# Patient Record
Sex: Male | Born: 1940
Health system: Southern US, Community
[De-identification: ages and names within clinical notes are randomized; demographics above are authoritative.]

## PROBLEM LIST (undated history)

## (undated) DIAGNOSIS — Z95810 Presence of automatic (implantable) cardiac defibrillator: Secondary | ICD-10-CM

## (undated) DIAGNOSIS — G43909 Migraine, unspecified, not intractable, without status migrainosus: Secondary | ICD-10-CM

## (undated) DIAGNOSIS — I255 Ischemic cardiomyopathy: Secondary | ICD-10-CM

## (undated) DIAGNOSIS — E7800 Pure hypercholesterolemia, unspecified: Secondary | ICD-10-CM

## (undated) DIAGNOSIS — G8929 Other chronic pain: Secondary | ICD-10-CM

## (undated) DIAGNOSIS — I10 Essential (primary) hypertension: Secondary | ICD-10-CM

## (undated) DIAGNOSIS — M62838 Other muscle spasm: Secondary | ICD-10-CM

## (undated) DIAGNOSIS — M542 Cervicalgia: Secondary | ICD-10-CM

## (undated) DIAGNOSIS — R51 Headache: Secondary | ICD-10-CM

## (undated) DIAGNOSIS — I2510 Atherosclerotic heart disease of native coronary artery without angina pectoris: Secondary | ICD-10-CM

## (undated) DIAGNOSIS — I219 Acute myocardial infarction, unspecified: Secondary | ICD-10-CM

## (undated) HISTORY — PX: TIBIA FRACTURE SURGERY: SHX806

## (undated) HISTORY — PX: CHOLECYSTECTOMY: SHX55

## (undated) HISTORY — DX: Essential (primary) hypertension: I10

## (undated) HISTORY — DX: Presence of automatic (implantable) cardiac defibrillator: Z95.810

## (undated) HISTORY — DX: Ischemic cardiomyopathy: I25.5

## (undated) HISTORY — PX: OTHER SURGICAL HISTORY: SHX169

---

## 2002-08-15 ENCOUNTER — Encounter: Payer: Self-pay | Admitting: Internal Medicine

## 2002-08-15 ENCOUNTER — Ambulatory Visit (HOSPITAL_COMMUNITY): Admission: RE | Admit: 2002-08-15 | Discharge: 2002-08-15 | Payer: Self-pay | Admitting: *Deleted

## 2005-01-31 ENCOUNTER — Ambulatory Visit (HOSPITAL_COMMUNITY): Admission: RE | Admit: 2005-01-31 | Discharge: 2005-01-31 | Payer: Self-pay | Admitting: Internal Medicine

## 2005-02-06 ENCOUNTER — Encounter (HOSPITAL_COMMUNITY): Admission: RE | Admit: 2005-02-06 | Discharge: 2005-02-07 | Payer: Self-pay | Admitting: Internal Medicine

## 2005-02-10 ENCOUNTER — Ambulatory Visit (HOSPITAL_COMMUNITY): Admission: RE | Admit: 2005-02-10 | Discharge: 2005-02-10 | Payer: Self-pay | Admitting: Internal Medicine

## 2005-05-08 ENCOUNTER — Encounter (HOSPITAL_COMMUNITY): Admission: RE | Admit: 2005-05-08 | Discharge: 2005-06-07 | Payer: Self-pay | Admitting: Orthopaedic Surgery

## 2005-06-08 ENCOUNTER — Encounter (HOSPITAL_COMMUNITY): Admission: RE | Admit: 2005-06-08 | Discharge: 2005-07-11 | Payer: Self-pay | Admitting: Orthopaedic Surgery

## 2009-09-13 ENCOUNTER — Ambulatory Visit (HOSPITAL_COMMUNITY): Admission: RE | Admit: 2009-09-13 | Discharge: 2009-09-13 | Payer: Self-pay | Admitting: Ophthalmology

## 2010-02-28 ENCOUNTER — Inpatient Hospital Stay (HOSPITAL_COMMUNITY)
Admission: EM | Admit: 2010-02-28 | Discharge: 2010-03-02 | Payer: Self-pay | Source: Home / Self Care | Attending: General Surgery | Admitting: General Surgery

## 2010-03-01 ENCOUNTER — Encounter (INDEPENDENT_AMBULATORY_CARE_PROVIDER_SITE_OTHER): Payer: Self-pay | Admitting: General Surgery

## 2010-03-02 LAB — DIFFERENTIAL
Basophils Absolute: 0 10*3/uL (ref 0.0–0.1)
Basophils Relative: 0 % (ref 0–1)
Eosinophils Absolute: 0.1 10*3/uL (ref 0.0–0.7)
Eosinophils Relative: 1 % (ref 0–5)
Lymphocytes Relative: 7 % — ABNORMAL LOW (ref 12–46)
Lymphs Abs: 0.8 10*3/uL (ref 0.7–4.0)
Monocytes Absolute: 0.5 10*3/uL (ref 0.1–1.0)
Monocytes Relative: 4 % (ref 3–12)
Neutro Abs: 9.8 10*3/uL — ABNORMAL HIGH (ref 1.7–7.7)
Neutrophils Relative %: 88 % — ABNORMAL HIGH (ref 43–77)

## 2010-03-02 LAB — MAGNESIUM: Magnesium: 1.7 mg/dL (ref 1.5–2.5)

## 2010-03-02 LAB — HEPATIC FUNCTION PANEL
ALT: 37 U/L (ref 0–53)
AST: 35 U/L (ref 0–37)
Albumin: 3.1 g/dL — ABNORMAL LOW (ref 3.5–5.2)
Alkaline Phosphatase: 55 U/L (ref 39–117)
Bilirubin, Direct: 0.2 mg/dL (ref 0.0–0.3)
Indirect Bilirubin: 0.6 mg/dL (ref 0.3–0.9)
Total Bilirubin: 0.8 mg/dL (ref 0.3–1.2)
Total Protein: 6.2 g/dL (ref 6.0–8.3)

## 2010-03-02 LAB — CBC
HCT: 37.8 % — ABNORMAL LOW (ref 39.0–52.0)
Hemoglobin: 13.1 g/dL (ref 13.0–17.0)
MCH: 32.3 pg (ref 26.0–34.0)
MCHC: 34.7 g/dL (ref 30.0–36.0)
MCV: 93.1 fL (ref 78.0–100.0)
Platelets: 135 10*3/uL — ABNORMAL LOW (ref 150–400)
RBC: 4.06 MIL/uL — ABNORMAL LOW (ref 4.22–5.81)
RDW: 13.1 % (ref 11.5–15.5)
WBC: 11.1 10*3/uL — ABNORMAL HIGH (ref 4.0–10.5)

## 2010-03-02 LAB — BASIC METABOLIC PANEL
BUN: 15 mg/dL (ref 6–23)
CO2: 29 mEq/L (ref 19–32)
Calcium: 8.9 mg/dL (ref 8.4–10.5)
Chloride: 100 mEq/L (ref 96–112)
Creatinine, Ser: 1.05 mg/dL (ref 0.4–1.5)
GFR calc Af Amer: >60 mL/min (ref >60)
GFR calc non Af Amer: >60 mL/min (ref >60)
Glucose, Bld: 110 mg/dL — ABNORMAL HIGH (ref 70–99)
Potassium: 4.1 mEq/L (ref 3.5–5.1)
Sodium: 135 mEq/L (ref 135–145)

## 2010-03-02 LAB — PHOSPHORUS: Phosphorus: 1.7 mg/dL — ABNORMAL LOW (ref 2.3–4.6)

## 2010-04-18 ENCOUNTER — Ambulatory Visit: Payer: Medicare Other | Attending: Internal Medicine

## 2010-04-18 DIAGNOSIS — G4710 Hypersomnia, unspecified: Secondary | ICD-10-CM | POA: Insufficient documentation

## 2010-04-18 DIAGNOSIS — G4730 Sleep apnea, unspecified: Secondary | ICD-10-CM | POA: Insufficient documentation

## 2010-05-09 LAB — COMPREHENSIVE METABOLIC PANEL
ALT: 41 U/L (ref 0–53)
AST: 35 U/L (ref 0–37)
Albumin: 4.3 g/dL (ref 3.5–5.2)
Alkaline Phosphatase: 63 U/L (ref 39–117)
BUN: 18 mg/dL (ref 6–23)
CO2: 28 mEq/L (ref 19–32)
Calcium: 10 mg/dL (ref 8.4–10.5)
Chloride: 101 mEq/L (ref 96–112)
Creatinine, Ser: 1.2 mg/dL (ref 0.4–1.5)
GFR calc Af Amer: >60 mL/min (ref >60)
GFR calc non Af Amer: >60 mL/min (ref >60)
Glucose, Bld: 158 mg/dL — ABNORMAL HIGH (ref 70–99)
Potassium: 4.8 mEq/L (ref 3.5–5.1)
Sodium: 140 mEq/L (ref 135–145)
Total Bilirubin: 0.6 mg/dL (ref 0.3–1.2)
Total Protein: 6.9 g/dL (ref 6.0–8.3)

## 2010-05-09 LAB — CBC
HCT: 44.1 % (ref 39.0–52.0)
Hemoglobin: 15.6 g/dL (ref 13.0–17.0)
MCH: 32.3 pg (ref 26.0–34.0)
MCHC: 35.4 g/dL (ref 30.0–36.0)
MCV: 91.3 fL (ref 78.0–100.0)
Platelets: 202 10*3/uL (ref 150–400)
RBC: 4.83 MIL/uL (ref 4.22–5.81)
RDW: 12.7 % (ref 11.5–15.5)
WBC: 16.4 10*3/uL — ABNORMAL HIGH (ref 4.0–10.5)

## 2010-05-09 LAB — URINALYSIS, ROUTINE W REFLEX MICROSCOPIC
Bilirubin Urine: NEGATIVE
Glucose, UA: NEGATIVE mg/dL
Ketones, ur: NEGATIVE mg/dL
Leukocytes, UA: NEGATIVE
Nitrite: NEGATIVE
Protein, ur: NEGATIVE mg/dL
Specific Gravity, Urine: 1.015 (ref 1.005–1.030)
Urobilinogen, UA: 0.2 mg/dL (ref 0.0–1.0)
pH: 7 (ref 5.0–8.0)

## 2010-05-09 LAB — LIPASE, BLOOD: Lipase: 17 U/L (ref 11–59)

## 2010-05-09 LAB — DIFFERENTIAL
Basophils Absolute: 0.1 10*3/uL (ref 0.0–0.1)
Basophils Relative: 0 % (ref 0–1)
Eosinophils Absolute: 0 10*3/uL (ref 0.0–0.7)
Eosinophils Relative: 0 % (ref 0–5)
Lymphocytes Relative: 4 % — ABNORMAL LOW (ref 12–46)
Lymphs Abs: 0.7 10*3/uL (ref 0.7–4.0)
Monocytes Absolute: 0.7 10*3/uL (ref 0.1–1.0)
Monocytes Relative: 4 % (ref 3–12)
Neutro Abs: 15 10*3/uL — ABNORMAL HIGH (ref 1.7–7.7)
Neutrophils Relative %: 91 % — ABNORMAL HIGH (ref 43–77)

## 2010-05-09 LAB — POCT CARDIAC MARKERS
CKMB, poc: 2 ng/mL (ref 1.0–8.0)
Myoglobin, poc: 59.7 ng/mL (ref 12–200)
Troponin i, poc: <0.05 ng/mL (ref 0.00–0.09)

## 2010-05-09 LAB — URINE MICROSCOPIC-ADD ON

## 2010-05-09 LAB — GLUCOSE, CAPILLARY: Glucose-Capillary: 130 mg/dL — ABNORMAL HIGH (ref 70–99)

## 2010-05-12 ENCOUNTER — Emergency Department (HOSPITAL_COMMUNITY): Payer: Medicare Other

## 2010-05-12 ENCOUNTER — Other Ambulatory Visit: Payer: Self-pay | Admitting: Orthopaedic Surgery

## 2010-05-12 ENCOUNTER — Observation Stay (HOSPITAL_COMMUNITY)
Admission: EM | Admit: 2010-05-12 | Discharge: 2010-05-13 | Disposition: A | Discharge status: Home / Self Care | Payer: Medicare Other | Attending: Orthopaedic Surgery | Admitting: Orthopaedic Surgery

## 2010-05-12 DIAGNOSIS — Z23 Encounter for immunization: Secondary | ICD-10-CM | POA: Insufficient documentation

## 2010-05-12 DIAGNOSIS — S82899B Other fracture of unspecified lower leg, initial encounter for open fracture type I or II: Principal | ICD-10-CM | POA: Insufficient documentation

## 2010-05-12 DIAGNOSIS — Z79899 Other long term (current) drug therapy: Secondary | ICD-10-CM | POA: Insufficient documentation

## 2010-05-12 DIAGNOSIS — W298XXA Contact with other powered powered hand tools and household machinery, initial encounter: Secondary | ICD-10-CM | POA: Insufficient documentation

## 2010-05-12 LAB — DIFFERENTIAL
Eosinophils Relative: 2 % (ref 0–5)
Lymphocytes Relative: 17 % (ref 12–46)
Lymphs Abs: 1.1 10*3/uL (ref 0.7–4.0)
Neutrophils Relative %: 73 % (ref 43–77)

## 2010-05-12 LAB — BASIC METABOLIC PANEL
Chloride: 107 mEq/L (ref 96–112)
GFR calc non Af Amer: >60 mL/min (ref >60)
Glucose, Bld: 102 mg/dL — ABNORMAL HIGH (ref 70–99)
Potassium: 4.5 mEq/L (ref 3.5–5.1)
Sodium: 139 mEq/L (ref 135–145)

## 2010-05-12 LAB — CBC
HCT: 39.2 % (ref 39.0–52.0)
MCV: 89.7 fL (ref 78.0–100.0)
RBC: 4.37 MIL/uL (ref 4.22–5.81)
WBC: 6.7 10*3/uL (ref 4.0–10.5)

## 2010-05-13 LAB — DIFFERENTIAL
Eosinophils Relative: 2 % (ref 0–5)
Lymphocytes Relative: 21 % (ref 12–46)
Lymphs Abs: 1.8 10*3/uL (ref 0.7–4.0)
Monocytes Absolute: 0.8 10*3/uL (ref 0.1–1.0)

## 2010-05-13 LAB — CBC
HCT: 37.9 % — ABNORMAL LOW (ref 39.0–52.0)
MCH: 31.4 pg (ref 26.0–34.0)
MCHC: 34.3 g/dL (ref 30.0–36.0)
MCV: 91.5 fL (ref 78.0–100.0)
RDW: 13.5 % (ref 11.5–15.5)

## 2010-05-13 LAB — BASIC METABOLIC PANEL
BUN: 15 mg/dL (ref 6–23)
Calcium: 8.6 mg/dL (ref 8.4–10.5)
GFR calc non Af Amer: >60 mL/min (ref >60)
Glucose, Bld: 95 mg/dL (ref 70–99)

## 2010-05-14 ENCOUNTER — Ambulatory Visit (HOSPITAL_COMMUNITY): Payer: Medicare Other

## 2010-05-15 ENCOUNTER — Encounter (HOSPITAL_COMMUNITY): Payer: Medicare Other | Attending: Oncology

## 2010-05-15 ENCOUNTER — Ambulatory Visit (HOSPITAL_COMMUNITY): Payer: Medicare Other

## 2010-05-15 DIAGNOSIS — S82209B Unspecified fracture of shaft of unspecified tibia, initial encounter for open fracture type I or II: Secondary | ICD-10-CM | POA: Insufficient documentation

## 2010-05-15 DIAGNOSIS — W298XXA Contact with other powered powered hand tools and household machinery, initial encounter: Secondary | ICD-10-CM | POA: Insufficient documentation

## 2010-05-15 LAB — BASIC METABOLIC PANEL
GFR calc non Af Amer: >60 mL/min (ref >60)
Glucose, Bld: 98 mg/dL (ref 70–99)
Potassium: 4.4 mEq/L (ref 3.5–5.1)
Sodium: 137 mEq/L (ref 135–145)

## 2010-05-16 LAB — WOUND CULTURE: Culture: NO GROWTH

## 2010-05-17 NOTE — Op Note (Signed)
NAME:  Thomas Black, Thomas Black                 ACCOUNT NO.:  [id number]  MEDICAL RECORD NO.:  [id number]           PATIENT TYPE:  I  LOCATION:  A319                          FACILITY:  [facility]  PHYSICIAN:  J. Darreld Mclean, M.D. DATE OF BIRTH:  Apr 25, 1940  DATE OF PROCEDURE: DATE OF DISCHARGE:                              OPERATIVE REPORT   PREOPERATIVE DIAGNOSIS:  Open fracture left tibia from chainsaw injury.  POSTOPERATIVE DIAGNOSIS:  Open fracture left tibia from chainsaw injury.  PROCEDURE:  Irrigation and debridement of the left tibia wound using 9 L of pulse lavage system and primary closure of wound.  ANESTHESIA:  Spinal.  SURGEON:  J. Darreld Mclean, MD  DRAINS:  None.  TOURNIQUET TIME:  28 minutes.  INDICATIONS:  The patient is a 70 year old male who was using a chainsaw and cut his right mid distal third tibia, he has got more of a chip type fracture out of it, it did not go through the entire tibia and it did not go through the entire cortex.  There is a long extensive laceration about 8-10 cm long.  It is a dirty wound.  I explained to the patient the need for the procedure, explained possible infection, explained about IV antibiotics, explained that he would be admitted tonight in the hospital and probably to do it tomorrow.  He appeared to understand and agreed to the procedure as outlined.  The patient marked on the left side with a pen.  I marked it with a pen. He was brought to the operating room, given spinal anesthesia.  Once spinal anesthesia was obtained, tourniquet placed and deflated on left upper thigh, was prepped and draped in usual manner.  Generalized time-out, all instrumentation deemed to be properly positioned and working.  The operating room team knew each other.  I elevated the leg and did not use the Esmarch.  Tourniquet inflated to 250 mmHg.  There was some devitalized tissue around the wound, I went ahead and removed that and I sharply debrided  some areas of dirt and other foreign material.  I used 9 L of the water pick solution.  I used Simplex water pick device and __________ cleaned and debrided the area. The wound looked excellent at the end.  He only had so-called nick in the bone of V-shape cut and I was able to pull the fascia back very nicely and repaired with 2-0 chromic, 2-0 plain and skin staples.  I elected not to use a drain.  Cultures were taken prior to all of this. I will begin IV vancomycin and wait for the cultures after several days and hopefully it can be changed to p.o. medication at that time.  The patient tolerated the procedure well, total tourniquet time was 28 minutes.  Sterile dressing applied.  Bulky dressing applied.  Ace bandage applied at the end of the case.          ______________________________ J. Darreld Mclean, M.D.     JWK/MEDQ  D:  05/12/2010  T:  05/13/2010  Job:  [id number]  Electronically Signed by Darreld Mclean M.D. on 05/17/2010 66:06:30 AM

## 2010-05-17 NOTE — Discharge Summary (Signed)
  NAME:  Thomas Black, Thomas Black                 ACCOUNT NO.:  [id number]  MEDICAL RECORD NO.:  [id number]           PATIENT TYPE:  O  LOCATION:  A319                          FACILITY:  [facility]  PHYSICIAN:  J. Darreld Mclean, M.D. DATE OF BIRTH:  01-09-1941  DATE OF ADMISSION:  05/12/2010 DATE OF DISCHARGE:  03/16/2012LH                              DISCHARGE SUMMARY   DISCHARGE DIAGNOSIS:  Fracture of left tibia, open dirty wound, secondary to chainsaw injury.  DISCHARGE STATUS AND PROGNOSIS:  Good.  DISPOSITION:  Home.  The patient is to come back to the hospital tomorrow, Saturday; and Sunday and Monday for IV vancomycin therapy.  DISCHARGE MEDICATIONS:  Percocet 7.5/325.  The patient is to elevate his leg, keep it dry, use crutches.  He has a Cam walker already.  BRIEF HISTORY:  The patient cut himself with a chainsaw yesterday, sent to the operating room last night, had irrigation and debridement for primary closure of the wound.  Care instructions have been given.  He is to elevate his leg, stay off of it.  He has remained afebrile.  He has done well.  He has gone to physical therapy, he has walked up and down the steps.  He is very stable on crutches.  It has been explained if any difficulties, contact me through the office hospital beeper system.  I will see him in my office on Monday morning.                                           ______________________________ J. Darreld Mclean, M.D.     JWK/MEDQ  D:  05/13/2010  T:  05/13/2010  Job:  [id number]  Electronically Signed by Darreld Mclean M.D. on 05/17/2010 08:27:32 AM

## 2010-06-17 ENCOUNTER — Other Ambulatory Visit: Payer: Self-pay | Admitting: Dermatology

## 2010-07-15 NOTE — Procedures (Signed)
NAMELARK, RUNK NO.:  [id number]   MEDICAL RECORD NO.:  [id number]          PATIENT TYPE:  REC   LOCATION:  RAD                           FACILITY:  [facility]   PHYSICIAN:  Kingsley Callander. [name], MD       DATE OF BIRTH:  17-Dec-1940   DATE OF PROCEDURE:  02/06/2005  DATE OF DISCHARGE:                                    STRESS TEST   HISTORY OF PRESENT ILLNESS:  The patient exercised 12 minutes (completing  stage IV of the Bruce protocol), obtaining a maximal heart rate of 142 (90%  of the age predicted maximal heart rate, and a work load of 12.8 mets).  Discontinued exercise due to fatigue.  There were no symptoms of chest pain.  There were infrequent PVC's.  There were no ST segment changes diagnostic of  ischemia.  The baseline electrocardiogram revealed normal sinus rhythm at 70  beats per minute.   IMPRESSION:  No evidence of exercise induced ischemia.  Myoview images  pending.      Kingsley Callander. [name], MD  Electronically Signed     ROF/MEDQ  D:  02/06/2005  T:  02/06/2005  Job:  [id number]

## 2010-07-21 ENCOUNTER — Other Ambulatory Visit: Payer: Self-pay | Admitting: Dermatology

## 2010-09-26 ENCOUNTER — Telehealth (INDEPENDENT_AMBULATORY_CARE_PROVIDER_SITE_OTHER): Payer: Self-pay | Admitting: *Deleted

## 2010-09-26 ENCOUNTER — Other Ambulatory Visit (INDEPENDENT_AMBULATORY_CARE_PROVIDER_SITE_OTHER): Payer: Self-pay | Admitting: *Deleted

## 2010-09-26 DIAGNOSIS — Z1211 Encounter for screening for malignant neoplasm of colon: Secondary | ICD-10-CM

## 2010-09-26 NOTE — Telephone Encounter (Signed)
Pt need movi prep escribed  TCS sch'd 01/11/11 @ 9:30 (8:30), ASA 2 days, movi prep inst mailed

## 2010-09-27 MED ORDER — PEG-KCL-NACL-NASULF-NA ASC-C 100 G PO SOLR: 1.0000 | Freq: Once | ORAL | Status: DC

## 2010-12-16 ENCOUNTER — Other Ambulatory Visit (INDEPENDENT_AMBULATORY_CARE_PROVIDER_SITE_OTHER): Payer: Self-pay | Admitting: *Deleted

## 2010-12-16 DIAGNOSIS — Z1211 Encounter for screening for malignant neoplasm of colon: Secondary | ICD-10-CM

## 2010-12-30 ENCOUNTER — Encounter (HOSPITAL_COMMUNITY): Payer: Self-pay | Admitting: Pharmacy Technician

## 2011-02-15 ENCOUNTER — Telehealth (INDEPENDENT_AMBULATORY_CARE_PROVIDER_SITE_OTHER): Payer: Self-pay | Admitting: *Deleted

## 2011-02-15 NOTE — Telephone Encounter (Signed)
PCP/Requesting MD:  [name]  Name: Thomas Black  DOB: 10/26/1940   Procedure: TCS  Reason/Indication:  SCREENING  Has patient had this procedure before?  YES  If so, when, by whom and where?  2002  Is there a family history of colon cancer?  NO  Who?  What age when diagnosed?    Is patient diabetic?   NO      Does patient have prosthetic heart valve?  NO  Do you have a pacemaker?  NO  Has patient had joint replacement within last 12 months?  NO  Is patient on Coumadin, Plavix and/or Aspirin? YES  Medications: ASA 81 MG DAILY, VIT D 3 1000 IU DAILY, SIMVASTATIN 40 MG DAILY, ATENOLOL 25 MG DAILY, STOOL SOFTENER DAILY, CYCLOBENZAPRINE 10 MG BID, OXYCODONE 325 MG PRN FOR MIGRAINES  Allergies: NKDA  Pharmacy: [facility]  Medication Adjustment: ASA 2 DAYS  Other:   Procedure date & time: 03/15/11 @ 730

## 2011-02-17 NOTE — Telephone Encounter (Signed)
approved

## 2011-03-14 MED ORDER — SODIUM CHLORIDE 0.45 % IV SOLN
Freq: Once | INTRAVENOUS | Status: AC
  Administered 2011-03-15: 07:00:00 via INTRAVENOUS

## 2011-03-15 ENCOUNTER — Ambulatory Visit (HOSPITAL_COMMUNITY)
Admission: RE | Admit: 2011-03-15 | Discharge: 2011-03-15 | Disposition: A | Discharge status: Home / Self Care | Payer: Medicare Other | Source: Ambulatory Visit | Attending: Internal Medicine | Admitting: Internal Medicine

## 2011-03-15 ENCOUNTER — Encounter (HOSPITAL_COMMUNITY)
Admission: RE | Disposition: A | Discharge status: Home / Self Care | Payer: Self-pay | Source: Ambulatory Visit | Attending: Internal Medicine

## 2011-03-15 ENCOUNTER — Encounter (HOSPITAL_COMMUNITY): Payer: Self-pay | Admitting: *Deleted

## 2011-03-15 DIAGNOSIS — K5730 Diverticulosis of large intestine without perforation or abscess without bleeding: Secondary | ICD-10-CM | POA: Insufficient documentation

## 2011-03-15 DIAGNOSIS — K644 Residual hemorrhoidal skin tags: Secondary | ICD-10-CM | POA: Insufficient documentation

## 2011-03-15 DIAGNOSIS — Z1211 Encounter for screening for malignant neoplasm of colon: Secondary | ICD-10-CM

## 2011-03-15 DIAGNOSIS — I10 Essential (primary) hypertension: Secondary | ICD-10-CM | POA: Insufficient documentation

## 2011-03-15 DIAGNOSIS — E7800 Pure hypercholesterolemia, unspecified: Secondary | ICD-10-CM | POA: Insufficient documentation

## 2011-03-15 HISTORY — DX: Essential (primary) hypertension: I10

## 2011-03-15 HISTORY — DX: Headache: R51

## 2011-03-15 HISTORY — DX: Other muscle spasm: M62.838

## 2011-03-15 HISTORY — DX: Other chronic pain: G89.29

## 2011-03-15 HISTORY — PX: COLONOSCOPY: SHX5424

## 2011-03-15 HISTORY — DX: Pure hypercholesterolemia, unspecified: E78.00

## 2011-03-15 HISTORY — DX: Cervicalgia: M54.2

## 2011-03-15 SURGERY — COLONOSCOPY
Anesthesia: Moderate Sedation

## 2011-03-15 MED ORDER — STERILE WATER FOR IRRIGATION IR SOLN
Status: DC | PRN
  Administered 2011-03-15: 08:00:00

## 2011-03-15 MED ORDER — MIDAZOLAM HCL 5 MG/5ML IJ SOLN
INTRAMUSCULAR | Status: AC
  Filled 2011-03-15: qty 10

## 2011-03-15 MED ORDER — MEPERIDINE HCL 50 MG/ML IJ SOLN
INTRAMUSCULAR | Status: DC | PRN
  Administered 2011-03-15 (×2): 25 mg via INTRAVENOUS

## 2011-03-15 MED ORDER — MEPERIDINE HCL 50 MG/ML IJ SOLN
INTRAMUSCULAR | Status: AC
  Filled 2011-03-15: qty 1

## 2011-03-15 MED ORDER — MIDAZOLAM HCL 5 MG/5ML IJ SOLN
INTRAMUSCULAR | Status: DC | PRN
  Administered 2011-03-15 (×5): 2 mg via INTRAVENOUS

## 2011-03-15 NOTE — Op Note (Signed)
COLONOSCOPY PROCEDURE REPORT  PATIENT:  Thomas Black  MR#:  [id number] Birthdate:  01-Dec-1940, 71 y.o., male Endoscopist:  Dr. [name], MD Referred By:  Dr. [name], MD Procedure Date: 03/15/2011  Procedure:   Colonoscopy  Indications:  Patient is 71 year old Caucasian male who is undergoing average risk screening colonoscopy. His last exam was 10 years ago.  Informed Consent: Procedure and risks were reviewed with the patient and informed consent was obtained.  Medications:  Demerol 50 mg IV Versed 10 mg IV  Description of procedure:  After a digital rectal exam was performed, that colonoscope was advanced from the anus through the rectum and colon to the area of the cecum, ileocecal valve and appendiceal orifice. The cecum was deeply intubated. These structures were well-seen and photographed for the record. From the level of the cecum and ileocecal valve, the scope was slowly and cautiously withdrawn. The mucosal surfaces were carefully surveyed utilizing scope tip to flexion to facilitate fold flattening as needed. The scope was pulled down into the rectum where a thorough exam including retroflexion was performed. Terminal ileum was also examined.  Findings:   Prep excellent. Normal terminal ileum. Few small diverticula at sigmoid colon; no colonic polyps or other mucosal abnormalities noted. Small hemorrhoids below the dentate line.  Therapeutic/Diagnostic Maneuvers Performed:  None  Complications:  None  Cecal Withdrawal Time:  11 minutes  Impression:  Normal terminal ileum. Few small diverticula at sigmoid colon and external hemorrhoids otherwise normal colonoscopy.  Recommendations:  Standard instructions given. Consider next screening exam in 10 years.  Jeno Calleros U  03/15/2011 8:19 AM  CC: Dr. [name], MD & Dr. Bonnetta Barry ref. provider found
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
## 2011-03-21 ENCOUNTER — Encounter (HOSPITAL_COMMUNITY): Payer: Self-pay | Admitting: Internal Medicine

## 2011-03-29 ENCOUNTER — Other Ambulatory Visit: Payer: Self-pay | Admitting: Dermatology

## 2011-07-06 ENCOUNTER — Other Ambulatory Visit: Payer: Self-pay | Admitting: Dermatology

## 2011-11-10 ENCOUNTER — Encounter (INDEPENDENT_AMBULATORY_CARE_PROVIDER_SITE_OTHER): Payer: Medicare Other | Admitting: Ophthalmology

## 2011-11-10 DIAGNOSIS — Q143 Congenital malformation of choroid: Secondary | ICD-10-CM

## 2011-11-10 DIAGNOSIS — H35039 Hypertensive retinopathy, unspecified eye: Secondary | ICD-10-CM

## 2011-11-10 DIAGNOSIS — H2510 Age-related nuclear cataract, unspecified eye: Secondary | ICD-10-CM

## 2011-11-10 DIAGNOSIS — H43819 Vitreous degeneration, unspecified eye: Secondary | ICD-10-CM

## 2011-11-10 DIAGNOSIS — I10 Essential (primary) hypertension: Secondary | ICD-10-CM

## 2012-01-04 ENCOUNTER — Other Ambulatory Visit: Payer: Self-pay | Admitting: Dermatology

## 2012-04-25 ENCOUNTER — Other Ambulatory Visit: Payer: Self-pay | Admitting: Dermatology

## 2012-06-24 ENCOUNTER — Encounter (HOSPITAL_COMMUNITY): Payer: Self-pay | Admitting: *Deleted

## 2012-06-24 ENCOUNTER — Emergency Department (HOSPITAL_COMMUNITY): Payer: Medicare Other

## 2012-06-24 ENCOUNTER — Inpatient Hospital Stay (HOSPITAL_COMMUNITY)
Admission: EM | Admit: 2012-06-24 | Discharge: 2012-06-27 | DRG: 440 | Disposition: A | Discharge status: Home / Self Care | Payer: Medicare Other | Attending: Internal Medicine | Admitting: Internal Medicine

## 2012-06-24 DIAGNOSIS — G43909 Migraine, unspecified, not intractable, without status migrainosus: Secondary | ICD-10-CM | POA: Diagnosis present

## 2012-06-24 DIAGNOSIS — M542 Cervicalgia: Secondary | ICD-10-CM | POA: Diagnosis present

## 2012-06-24 DIAGNOSIS — I10 Essential (primary) hypertension: Secondary | ICD-10-CM | POA: Diagnosis present

## 2012-06-24 DIAGNOSIS — E7800 Pure hypercholesterolemia, unspecified: Secondary | ICD-10-CM | POA: Diagnosis present

## 2012-06-24 DIAGNOSIS — G8929 Other chronic pain: Secondary | ICD-10-CM | POA: Diagnosis present

## 2012-06-24 DIAGNOSIS — M62838 Other muscle spasm: Secondary | ICD-10-CM | POA: Diagnosis present

## 2012-06-24 DIAGNOSIS — K859 Acute pancreatitis without necrosis or infection, unspecified: Principal | ICD-10-CM

## 2012-06-24 DIAGNOSIS — Z79899 Other long term (current) drug therapy: Secondary | ICD-10-CM

## 2012-06-24 DIAGNOSIS — Z791 Long term (current) use of non-steroidal anti-inflammatories (NSAID): Secondary | ICD-10-CM

## 2012-06-24 DIAGNOSIS — E785 Hyperlipidemia, unspecified: Secondary | ICD-10-CM | POA: Diagnosis present

## 2012-06-24 DIAGNOSIS — D72829 Elevated white blood cell count, unspecified: Secondary | ICD-10-CM | POA: Diagnosis present

## 2012-06-24 DIAGNOSIS — K259 Gastric ulcer, unspecified as acute or chronic, without hemorrhage or perforation: Secondary | ICD-10-CM | POA: Diagnosis present

## 2012-06-24 DIAGNOSIS — K449 Diaphragmatic hernia without obstruction or gangrene: Secondary | ICD-10-CM | POA: Diagnosis present

## 2012-06-24 HISTORY — DX: Migraine, unspecified, not intractable, without status migrainosus: G43.909

## 2012-06-24 LAB — URINALYSIS, ROUTINE W REFLEX MICROSCOPIC
Bilirubin Urine: NEGATIVE
Glucose, UA: NEGATIVE mg/dL
Specific Gravity, Urine: <1.005 — ABNORMAL LOW (ref 1.005–1.030)
Urobilinogen, UA: 0.2 mg/dL (ref 0.0–1.0)

## 2012-06-24 LAB — CBC WITH DIFFERENTIAL/PLATELET
Basophils Absolute: 0 10*3/uL (ref 0.0–0.1)
Lymphocytes Relative: 4 % — ABNORMAL LOW (ref 12–46)
Lymphs Abs: 0.7 10*3/uL (ref 0.7–4.0)
Neutrophils Relative %: 88 % — ABNORMAL HIGH (ref 43–77)
Platelets: 207 10*3/uL (ref 150–400)
RBC: 5.22 MIL/uL (ref 4.22–5.81)
WBC: 15.7 10*3/uL — ABNORMAL HIGH (ref 4.0–10.5)

## 2012-06-24 LAB — COMPREHENSIVE METABOLIC PANEL
ALT: 15 U/L (ref 0–53)
AST: 18 U/L (ref 0–37)
Alkaline Phosphatase: 87 U/L (ref 39–117)
CO2: 27 mEq/L (ref 19–32)
GFR calc Af Amer: 70 mL/min — ABNORMAL LOW (ref >90)
GFR calc non Af Amer: 60 mL/min — ABNORMAL LOW (ref >90)
Glucose, Bld: 116 mg/dL — ABNORMAL HIGH (ref 70–99)
Potassium: 4.9 mEq/L (ref 3.5–5.1)
Sodium: 135 mEq/L (ref 135–145)
Total Protein: 8.2 g/dL (ref 6.0–8.3)

## 2012-06-24 LAB — URINE MICROSCOPIC-ADD ON: Urine-Other: NONE SEEN

## 2012-06-24 MED ORDER — ENOXAPARIN SODIUM 40 MG/0.4ML ~~LOC~~ SOLN
40.0000 mg | SUBCUTANEOUS | Status: DC
  Administered 2012-06-24 – 2012-06-26 (×3): 40 mg via SUBCUTANEOUS
  Filled 2012-06-24 (×3): qty 0.4

## 2012-06-24 MED ORDER — IOHEXOL 300 MG/ML  SOLN
100.0000 mL | Freq: Once | INTRAMUSCULAR | Status: AC | PRN
  Administered 2012-06-24: 100 mL via INTRAVENOUS

## 2012-06-24 MED ORDER — PANTOPRAZOLE SODIUM 40 MG IV SOLR
40.0000 mg | INTRAVENOUS | Status: DC
  Administered 2012-06-24 – 2012-06-25 (×2): 40 mg via INTRAVENOUS
  Filled 2012-06-24 (×2): qty 40

## 2012-06-24 MED ORDER — IOHEXOL 300 MG/ML  SOLN
50.0000 mL | Freq: Once | INTRAMUSCULAR | Status: AC | PRN
  Administered 2012-06-24: 50 mL via ORAL

## 2012-06-24 MED ORDER — SODIUM CHLORIDE 0.9 % IV SOLN
1000.0000 mL | Freq: Once | INTRAVENOUS | Status: AC
  Administered 2012-06-24: 1000 mL via INTRAVENOUS

## 2012-06-24 MED ORDER — ZOLPIDEM TARTRATE 5 MG PO TABS
5.0000 mg | ORAL_TABLET | Freq: Every evening | ORAL | Status: DC | PRN
  Administered 2012-06-26: 5 mg via ORAL
  Filled 2012-06-24: qty 1

## 2012-06-24 MED ORDER — DEXTROSE-NACL 5-0.9 % IV SOLN
INTRAVENOUS | Status: DC
  Administered 2012-06-24 – 2012-06-26 (×5): via INTRAVENOUS

## 2012-06-24 MED ORDER — ACETAMINOPHEN 650 MG RE SUPP: 650.0000 mg | Freq: Four times a day (QID) | RECTAL | Status: DC | PRN

## 2012-06-24 MED ORDER — ONDANSETRON HCL 4 MG PO TABS: 4.0000 mg | ORAL_TABLET | Freq: Four times a day (QID) | ORAL | Status: DC | PRN

## 2012-06-24 MED ORDER — ONDANSETRON HCL 4 MG/2ML IJ SOLN
4.0000 mg | Freq: Once | INTRAMUSCULAR | Status: AC
  Administered 2012-06-24: 4 mg via INTRAVENOUS
  Filled 2012-06-24: qty 2

## 2012-06-24 MED ORDER — ALUM & MAG HYDROXIDE-SIMETH 200-200-20 MG/5ML PO SUSP
30.0000 mL | Freq: Four times a day (QID) | ORAL | Status: DC | PRN
  Administered 2012-06-24 – 2012-06-26 (×2): 30 mL via ORAL
  Filled 2012-06-24 (×2): qty 30

## 2012-06-24 MED ORDER — ACETAMINOPHEN 325 MG PO TABS: 650.0000 mg | ORAL_TABLET | Freq: Four times a day (QID) | ORAL | Status: DC | PRN

## 2012-06-24 MED ORDER — HYDROMORPHONE HCL PF 1 MG/ML IJ SOLN
1.0000 mg | INTRAMUSCULAR | Status: DC | PRN
  Administered 2012-06-24 – 2012-06-26 (×15): 1 mg via INTRAVENOUS
  Filled 2012-06-24 (×15): qty 1

## 2012-06-24 MED ORDER — ONDANSETRON HCL 4 MG/2ML IJ SOLN
4.0000 mg | Freq: Four times a day (QID) | INTRAMUSCULAR | Status: DC | PRN
  Administered 2012-06-25: 4 mg via INTRAVENOUS
  Filled 2012-06-24: qty 2

## 2012-06-24 MED ORDER — HYDROMORPHONE HCL PF 1 MG/ML IJ SOLN
1.0000 mg | Freq: Once | INTRAMUSCULAR | Status: AC
  Administered 2012-06-24: 1 mg via INTRAVENOUS
  Filled 2012-06-24: qty 1

## 2012-06-24 MED ORDER — SODIUM CHLORIDE 0.9 % IV SOLN: 1000.0000 mL | INTRAVENOUS | Status: DC

## 2012-06-24 MED ORDER — MAGNESIUM HYDROXIDE 400 MG/5ML PO SUSP
30.0000 mL | Freq: Every day | ORAL | Status: DC | PRN
  Administered 2012-06-25 – 2012-06-26 (×2): 30 mL via ORAL
  Filled 2012-06-24 (×2): qty 30

## 2012-06-24 NOTE — ED Provider Notes (Signed)
History     CSN: [id number]  Arrival date & time 06/24/12  1225   First MD Initiated Contact with Patient 06/24/12 1507      Chief Complaint  Patient presents with  . Abdominal Pain    (Consider location/radiation/quality/duration/timing/severity/associated sxs/prior treatment) Patient is a 72 y.o. male presenting with abdominal pain. The history is provided by the patient.  Abdominal Pain He had onset 4 days ago of epigastric pain without radiation. Pain tends to wax and wane but has been constant. He rates pain at 7/10. There is associated nausea without vomiting. He is noted that he is belching constantly. He denies constipation diarrhea. Nothing makes the pain better nothing makes it worse. He has been able to eat only a small amount of food fell during this time. He has never had pain like this before. He is status post cholecystectomy. He denies alcohol use and states that he has borderline hyperlipidemia.  Past Medical History  Diagnosis Date  . Hypertension   . Hypercholesteremia   . Headache   . Chronic neck pain   . Muscle spasms of neck   . Migraine     Past Surgical History  Procedure Laterality Date  . Fractured nose    . Fractured jaw    . Cholecystectomy      1/12  . Left acl repair    . Left knee arthroscopy    . Colonoscopy  03/15/2011    Procedure: COLONOSCOPY;  Surgeon: [name], MD;  Location: [facility];  Service: Endoscopy;  Laterality: N/A;  9:30 am  . Tibia fracture surgery      Family History  Problem Relation Age of Onset  . Colon cancer Neg Hx     History  Substance Use Topics  . Smoking status: Never Smoker   . Smokeless tobacco: Not on file  . Alcohol Use: No      Review of Systems  Gastrointestinal: Positive for abdominal pain.  All other systems reviewed and are negative.    Allergies  Review of patient's allergies indicates no known allergies.  Home Medications   Current Outpatient Rx  Name  Route  Sig   Dispense  Refill  . aspirin EC 81 MG tablet   Oral   Take 81 mg by mouth daily.           Marland Kitchen atenolol (TENORMIN) 25 MG tablet   Oral   Take 25 mg by mouth daily.           . cholecalciferol (VITAMIN D) 1000 UNITS tablet   Oral   Take 1,000 Units by mouth daily.           . cyclobenzaprine (FLEXERIL) 10 MG tablet   Oral   Take 10 mg by mouth 3 (three) times daily as needed. For muscle spasms          . oxyCODONE-acetaminophen (PERCOCET) 10-325 MG per tablet   Oral   Take 1 tablet by mouth every 4 (four) hours as needed. migraines          . simvastatin (ZOCOR) 40 MG tablet   Oral   Take 40 mg by mouth at bedtime.             BP 138/66  Pulse 81  Temp(Src) 98.7 F (37.1 C) (Oral)  Resp 20  Ht 6' (1.829 m)  Wt 212 lb (96.163 kg)  BMI 28.75 kg/m2  SpO2 98%  Physical Exam  Nursing note and vitals reviewed.  72 year old male, resting comfortably and in no acute distress. Vital signs are normal. Oxygen saturation is 98%, which is normal. Head is normocephalic and atraumatic. PERRLA, EOMI. Oropharynx is clear. Neck is nontender and supple without adenopathy or JVD. Back is nontender and there is no CVA tenderness. Lungs are clear without rales, wheezes, or rhonchi. Chest is nontender. Heart has regular rate and rhythm without murmur. Abdomen is soft, flat, with moderate epigastric tenderness. There is no rebound or guarding. There are no hepatosplenomegaly and peristalsis is hypoactive. Extremities have no cyanosis or edema, full range of motion is present. Skin is warm and dry without rash. Neurologic: Mental status is normal, cranial nerves are intact, there are no motor or sensory deficits.  ED Course  Procedures (including critical care time)  Results for orders placed during the hospital encounter of 06/24/12  CBC WITH DIFFERENTIAL      Result Value Range   WBC 15.7 (*) 4.0 - 10.5 K/uL   RBC 5.22  4.22 - 5.81 MIL/uL   Hemoglobin 16.8  13.0 - 17.0  g/dL   HCT 16.1  09.6 - 04.5 %   MCV 90.8  78.0 - 100.0 fL   MCH 32.2  26.0 - 34.0 pg   MCHC 35.4  30.0 - 36.0 g/dL   RDW 40.9  81.1 - 91.4 %   Platelets 207  150 - 400 K/uL   Neutrophils Relative 88 (*) 43 - 77 %   Neutro Abs 13.9 (*) 1.7 - 7.7 K/uL   Lymphocytes Relative 4 (*) 12 - 46 %   Lymphs Abs 0.7  0.7 - 4.0 K/uL   Monocytes Relative 7  3 - 12 %   Monocytes Absolute 1.1 (*) 0.1 - 1.0 K/uL   Eosinophils Relative 0  0 - 5 %   Eosinophils Absolute 0.0  0.0 - 0.7 K/uL   Basophils Relative 0  0 - 1 %   Basophils Absolute 0.0  0.0 - 0.1 K/uL  COMPREHENSIVE METABOLIC PANEL      Result Value Range   Sodium 135  135 - 145 mEq/L   Potassium 4.9  3.5 - 5.1 mEq/L   Chloride 98  96 - 112 mEq/L   CO2 27  19 - 32 mEq/L   Glucose, Bld 116 (*) 70 - 99 mg/dL   BUN 20  6 - 23 mg/dL   Creatinine, Ser 7.82  0.50 - 1.35 mg/dL   Calcium 9.9  8.4 - 95.6 mg/dL   Total Protein 8.2  6.0 - 8.3 g/dL   Albumin 4.3  3.5 - 5.2 g/dL   AST 18  0 - 37 U/L   ALT 15  0 - 53 U/L   Alkaline Phosphatase 87  39 - 117 U/L   Total Bilirubin 0.9  0.3 - 1.2 mg/dL   GFR calc non Af Amer 60 (*) >90 mL/min   GFR calc Af Amer 70 (*) >90 mL/min  LIPASE, BLOOD      Result Value Range   Lipase 192 (*) 11 - 59 U/L   Ct Abdomen Pelvis W Contrast  06/24/2012  *RADIOLOGY REPORT*  Clinical Data: Abdominal pain and cramping for 3 days.  Previous cholecystectomy.  Nausea.  CT ABDOMEN AND PELVIS WITH CONTRAST  Technique:  Multidetector CT imaging of the abdomen and pelvis was performed following the standard protocol during bolus administration of intravenous contrast.  Contrast: 50mL OMNIPAQUE IOHEXOL 300 MG/ML  SOLN, OMNIPAQUE IOHEXOL 300 MG/ML  SOLN  Comparison: 02/28/2010  Findings: Lung bases show mild scarring and atelectasis.  No pleural or pericardial fluid.  The liver contains several benign cysts, unchanged since the previous study, the largest in the left lobe ventrally measuring 14 mm in diameter.  10 mm cyst  along the dorsal surface of the right lobe is also unchanged.  There has been previous cholecystectomy. No ductal dilatation.  The spleen is normal.  The pancreas may be associated with very mild stranding/edema raising the question of early pancreatitis.  If lab data is normal, this may not to be a valid finding.  The adrenal glands are normal.  1 cm cyst at the upper pole left kidney is present.  5 mm cyst in the ventral midportion of the right kidney is present.  No acute or significant renal finding.  The aorta shows mild atherosclerosis but no aneurysm.  The IVC is normal.  No retroperitoneal mass or adenopathy.  Normal appearing appendix.  No acute bowel pathology. Bladder, prostate gland and seminal vesicles are unremarkable. Minimal degenerative changes effect the spine.  IMPRESSION: Linear scarring and atelectasis at the lung bases.  Question the earliest imaging findings of pancreatitis.  This is not definite.  If lab evaluation is normal, I would be inclined to disregard that suggestion.  No other finding to explain abdominal pain.   Original Report Authenticated By: Paulina Fusi, M.D.     Date: 06/24/2012  Rate: 75  Rhythm: normal sinus rhythm  QRS Axis: normal  Intervals: normal  ST/T Wave abnormalities: normal  Conduction Disutrbances:Incomplete right bundle-branch block  Narrative Interpretation: Incomplete right bundle-branch block. When compared with ECG of 05/12/2010, no significant changes are seen.  Old EKG Reviewed: unchanged    1. Acute pancreatitis       MDM  Abdominal pain and nausea worrisome for possible pancreatitis. He had been referred here by his PCP, Dr. [name], who had seen him in his office with similar concerns. He has been sent for a CT scan and laboratory workup. CT head scan is consistent with pancreatitis, and lipase is elevated consistent with pancreatitis. Reason for pancreatitis is not clear as he is not an alcohol abuser and does not have a gallbladder, and  does not have gross hyperlipidemia. Case is discussed with his PCP, Dr. [name], who agrees to admit him to the hospital.        Dione Booze, MD 06/24/12 [phone]

## 2012-06-24 NOTE — ED Notes (Signed)
abd pain since Friday night,  Nausea , no vomiting,  No fever or chill.  Frequent belching

## 2012-06-25 LAB — COMPREHENSIVE METABOLIC PANEL
ALT: 15 U/L (ref 0–53)
AST: 19 U/L (ref 0–37)
Calcium: 8.7 mg/dL (ref 8.4–10.5)
Creatinine, Ser: 1.17 mg/dL (ref 0.50–1.35)
GFR calc Af Amer: 71 mL/min — ABNORMAL LOW (ref >90)
Glucose, Bld: 110 mg/dL — ABNORMAL HIGH (ref 70–99)
Sodium: 134 mEq/L — ABNORMAL LOW (ref 135–145)
Total Protein: 6.6 g/dL (ref 6.0–8.3)

## 2012-06-25 LAB — LIPID PANEL
HDL: 60 mg/dL (ref >39)
Total CHOL/HDL Ratio: 2 RATIO
Triglycerides: 83 mg/dL (ref <150)

## 2012-06-25 LAB — CBC
MCH: 32.6 pg (ref 26.0–34.0)
MCHC: 35.5 g/dL (ref 30.0–36.0)
Platelets: 167 10*3/uL (ref 150–400)

## 2012-06-25 MED ORDER — OXYCODONE-ACETAMINOPHEN 5-325 MG PO TABS
1.0000 | ORAL_TABLET | ORAL | Status: DC | PRN
  Administered 2012-06-25 – 2012-06-27 (×2): 1 via ORAL
  Filled 2012-06-25: qty 1
  Filled 2012-06-25: qty 2

## 2012-06-25 MED ORDER — DOXYCYCLINE HYCLATE 100 MG IV SOLR
100.0000 mg | Freq: Two times a day (BID) | INTRAVENOUS | Status: DC
  Administered 2012-06-25 – 2012-06-27 (×5): 100 mg via INTRAVENOUS
  Filled 2012-06-25 (×7): qty 100

## 2012-06-25 NOTE — Consult Note (Signed)
Referring Provider: No ref. provider found Primary Care Physician:  [name], MD Primary Gastroenterologist:  Dr. Karilyn Cota  Reason for Consultation:  Acute pancreatitis.  HPI:  Patient is a 72 year old Caucasian male who presented to Dr. Alonza Smoker office yesterday with three-day history of epigastric pain and frequent belching. He was sent over to the emergency room for further evaluation. He had abdominopelvic CT which showed changes of pancreatitis and he was therefore hospitalized. Patient's pain began on the evening of 06/21/2012 hernia developed sharp pain across his upper abdomen centered in the epigastric region associated with frequent belching. He also noted fullness and felt his food with staining in the stomach. He did not experience nausea vomiting fever or chills. Despite his pain he was able to eat fairly well over the weekend. He did take OTC Zantac and TUMS which did not help. Over the weekend his pain became more intense. He denies heartburn anorexia or weight loss. He had normal bowel movement 4 days ago. He didn't get her to contrast for CT yesterday. He denies chest pain or shortness of breath. He spiked a temp to over 1 or 2 and blood cultures are obtained this morning. Patient states he was camping 2 weeks ago and 31 takes were pulled off his skin. He has not experienced any rash.  Past Medical History  Diagnosis Date  . Hypertension   . Hypercholesteremia   . Headache   . Chronic neck pain   . Muscle spasms of neck   . Migraine     Past Surgical History  Procedure Laterality Date  . Fractured nose    . Fractured jaw    . Cholecystectomy      1/12  . Left acl repair    . Left knee arthroscopy    . Colonoscopy  03/15/2011    Procedure: COLONOSCOPY;  Surgeon: [name], MD;  Location: [facility];  Service: Endoscopy;  Laterality: N/A;  9:30 am  . Tibia fracture surgery     cholecystectomy was for symptomatic cholelithiasis.   Prior to Admission  medications   Medication Sig Start Date End Date Taking? Authorizing Provider  aspirin EC 81 MG tablet Take 81 mg by mouth at bedtime.    Yes Historical Provider, MD  atenolol (TENORMIN) 25 MG tablet Take 25 mg by mouth at bedtime.    Yes Historical Provider, MD  cyclobenzaprine (FLEXERIL) 10 MG tablet Take 10 mg by mouth at bedtime. For muscle spasms   Yes Historical Provider, MD  docusate sodium (COLACE) 100 MG capsule Take 100 mg by mouth at bedtime.   Yes Historical Provider, MD  oxyCODONE-acetaminophen (PERCOCET) 10-325 MG per tablet Take 0.5-1 tablets by mouth daily as needed for pain (for migraine pain). migraines   Yes Historical Provider, MD  simvastatin (ZOCOR) 40 MG tablet Take 40 mg by mouth at bedtime.    Yes Historical Provider, MD    Current Facility-Administered Medications  Medication Dose Route Frequency Provider Last Rate Last Dose  . 0.9 %  sodium chloride infusion  1,000 mL Intravenous Continuous Dione Booze, MD      . acetaminophen (TYLENOL) tablet 650 mg  650 mg Oral Q6H PRN [name], MD       Or  . acetaminophen (TYLENOL) suppository 650 mg  650 mg Rectal Q6H PRN [name], MD      . alum & mag hydroxide-simeth (MAALOX/MYLANTA) 200-200-20 MG/5ML suspension 30 mL  30 mL Oral Q6H PRN [name], MD   30 mL at  06/24/12 1814  . dextrose 5 %-0.9 % sodium chloride infusion   Intravenous Continuous [name], MD 125 mL/hr at 06/25/12 0034    . enoxaparin (LOVENOX) injection 40 mg  40 mg Subcutaneous Q24H [name], MD   40 mg at 06/24/12 2125  . HYDROmorphone (DILAUDID) injection 1 mg  1 mg Intravenous Q2H PRN [name], MD   1 mg at 06/25/12 0910  . magnesium hydroxide (MILK OF MAGNESIA) suspension 30 mL  30 mL Oral Daily PRN [name], MD      . ondansetron [facility]) tablet 4 mg  4 mg Oral Q6H PRN [name], MD       Or  . ondansetron [facility]) injection 4 mg  4 mg Intravenous Q6H PRN [name], MD      . pantoprazole (PROTONIX) injection 40 mg  40 mg Intravenous Q24H [name], MD   40 mg at 06/25/12 0905  . zolpidem (AMBIEN) tablet 5 mg  5 mg Oral QHS PRN [name], MD        Allergies as of 06/24/2012  . (No Known Allergies)    Family History  Problem Relation Age of Onset  . Colon cancer Neg Hx    family history is negative pancreatitis. Father died of CVA in his late 31s and mother died of COPD at 63. He had one sister who died of MS at age 51  Social history; He is married and has 3 children in good health. He is retired. He worked for [employer] for 30 years. He has never smoked cigarettes or drank alcohol. [facility]  Review of Systems: See HPI, otherwise normal ROS  Physical Exam: Temp:  [98.5 F (36.9 C)-102.2 F (39 C)] 100.5 F (38.1 C) (04/29 0421) Pulse Rate:  [77-98] 98 (04/29 0421) Resp:  [20] 20 (04/29 0421) BP: (135-155)/(66-82) 144/75 mmHg (04/29 0421) SpO2:  [90 %-98 %] 91 % (04/29 0421) Weight:  [212 lb (96.163 kg)-214 lb 8.1 oz (97.3 kg)] 214 lb 8.1 oz (97.3 kg) (04/29 0421) Last BM Date: 06/21/12 Patient is alert and appears to be in no acute distress. Conjunctiva was pink. Sclerae nonicteric. Oropharyngeal mucosa is normal. He has partial upper and lower dentures. No neck masses or thyromegaly noted. Cardiac exam with regular rhythm normal S1 and S2. No murmur or gallop noted. Auscultation of lungs reveals vesicular breath sounds bilaterally. Abdomen is symmetrical with normal bowel sounds. It is soft with tenderness across upper half of the abdomen. His tenderness is mild except in midepigastric region which is moderate with some guarding but no rebound. No organomegaly or masses noted. Peripheral edema clubbing or skin rash noted.  Intake/Output from previous day: 04/28 0701 - 04/29 0700 In: -  Out: 400 [Urine:400] Intake/Output this shift:    Lab Results:  Recent Labs  06/24/12 1254 06/25/12 0521  WBC 15.7* 16.3*  HGB 16.8 14.1  HCT 47.4 39.7  PLT 207 167    BMET  Recent Labs  06/24/12 1254 06/25/12 0522  NA 135 134*  K 4.9 3.9  CL 98 100  CO2 27 26  GLUCOSE 116* 110*  BUN 20 14  CREATININE 1.19 1.17  CALCIUM 9.9 8.7   LFT  Recent Labs  06/25/12 0522  PROT 6.6  ALBUMIN 3.2*  AST 19  ALT 15  ALKPHOS 73  BILITOT 1.3*   PT/INR No results found for this basename: LABPROT, INR,  in the last 72  hours Hepatitis Panel No results found for this basename: HEPBSAG, HCVAB, HEPAIGM, HEPBIGM,  in the last 72 hours  Studies/Results: Ct Abdomen Pelvis W Contrast  06/24/2012  *RADIOLOGY REPORT*  Clinical Data: Abdominal pain and cramping for 3 days.  Previous cholecystectomy.  Nausea.  CT ABDOMEN AND PELVIS WITH CONTRAST  Technique:  Multidetector CT imaging of the abdomen and pelvis was performed following the standard protocol during bolus administration of intravenous contrast.  Contrast: 50mL OMNIPAQUE IOHEXOL 300 MG/ML  SOLN, OMNIPAQUE IOHEXOL 300 MG/ML  SOLN  Comparison: 02/28/2010  Findings: Lung bases show mild scarring and atelectasis.  No pleural or pericardial fluid.  The liver contains several benign cysts, unchanged since the previous study, the largest in the left lobe ventrally measuring 14 mm in diameter.  10 mm cyst along the dorsal surface of the right lobe is also unchanged.  There has been previous cholecystectomy. No ductal dilatation.  The spleen is normal.  The pancreas may be associated with very mild stranding/edema raising the question of early pancreatitis.  If lab data is normal, this may not to be a valid finding.  The adrenal glands are normal.  1 cm cyst at the upper pole left kidney is present.  5 mm cyst in the ventral midportion of the right kidney is present.  No acute or significant renal finding.  The aorta shows mild atherosclerosis but no aneurysm.  The IVC is normal.  No retroperitoneal mass or adenopathy.  Normal appearing appendix.  No acute bowel pathology. Bladder, prostate gland and seminal vesicles  are unremarkable. Minimal degenerative changes effect the spine.  IMPRESSION: Linear scarring and atelectasis at the lung bases.  Question the earliest imaging findings of pancreatitis.  This is not definite.  If lab evaluation is normal, I would be inclined to disregard that suggestion.  No other finding to explain abdominal pain.   Original Report Authenticated By: Paulina Fusi, M.D.   Current and prior CT from January 2012 reviewed along with Dr. Suszanne Conners  Assessment; Patient has acute pancreatitis which appears to be uncomplicated. He does not have fluid collection and pancreas enhances. CBD is nondilated and transaminases are normal. Serum calcium and triglyceride levels are also normal. Patient has been on Aleve chronically and therefore could have penetrating ulcer resulting in pancreatitis. He is on intermittent narcotic therapy for migraine but uses it very sparingly therefore I doubt that we are dealing with sphincter of OD dysfunction. Patient appears to be symptomatically but was febrile during the night. He does not have skin rash but he had 31 takes hold off his skin two weeks ago while he was camping. Therefore we should check him for [facility] spotted fever.  Recommendations; Continue n.p.o. status for now. RMSF IgG and IgM titers. Doxycycline 100 mg IV every 12 hours. Repeat LFTs and amylase and lipase in a.m. Possible EGD in a.m.   LOS: 1 day   REHMAN,NAJEEB U  06/25/2012, 9:16 AM

## 2012-06-26 ENCOUNTER — Encounter (HOSPITAL_COMMUNITY): Payer: Self-pay | Admitting: *Deleted

## 2012-06-26 ENCOUNTER — Encounter (HOSPITAL_COMMUNITY)
Admission: EM | Disposition: A | Discharge status: Home / Self Care | Payer: Self-pay | Source: Home / Self Care | Attending: Internal Medicine

## 2012-06-26 DIAGNOSIS — K449 Diaphragmatic hernia without obstruction or gangrene: Secondary | ICD-10-CM

## 2012-06-26 DIAGNOSIS — K259 Gastric ulcer, unspecified as acute or chronic, without hemorrhage or perforation: Secondary | ICD-10-CM

## 2012-06-26 HISTORY — PX: ESOPHAGOGASTRODUODENOSCOPY: SHX5428

## 2012-06-26 LAB — LIPASE, BLOOD: Lipase: 22 U/L (ref 11–59)

## 2012-06-26 LAB — CBC
Platelets: 168 10*3/uL (ref 150–400)
RBC: 3.99 MIL/uL — ABNORMAL LOW (ref 4.22–5.81)
RDW: 13.3 % (ref 11.5–15.5)
WBC: 13.4 10*3/uL — ABNORMAL HIGH (ref 4.0–10.5)

## 2012-06-26 LAB — HEPATIC FUNCTION PANEL
AST: 17 U/L (ref 0–37)
Albumin: 2.9 g/dL — ABNORMAL LOW (ref 3.5–5.2)
Total Protein: 6.4 g/dL (ref 6.0–8.3)

## 2012-06-26 LAB — ROCKY MTN SPOTTED FVR AB, IGM-BLOOD: RMSF IgM: 0.06 IV (ref 0.00–0.89)

## 2012-06-26 LAB — AMYLASE: Amylase: 24 U/L (ref 0–105)

## 2012-06-26 LAB — ROCKY MTN SPOTTED FVR AB, IGG-BLOOD: RMSF IgG: 1.17 IV — ABNORMAL HIGH

## 2012-06-26 SURGERY — EGD (ESOPHAGOGASTRODUODENOSCOPY)
Anesthesia: Moderate Sedation

## 2012-06-26 MED ORDER — BUTAMBEN-TETRACAINE-BENZOCAINE 2-2-14 % EX AERO
INHALATION_SPRAY | CUTANEOUS | Status: DC | PRN
  Administered 2012-06-26: 2 via TOPICAL

## 2012-06-26 MED ORDER — MEPERIDINE HCL 25 MG/ML IJ SOLN
INTRAMUSCULAR | Status: DC | PRN
  Administered 2012-06-26 (×2): 25 mg via INTRAVENOUS

## 2012-06-26 MED ORDER — PANTOPRAZOLE SODIUM 40 MG IV SOLR
40.0000 mg | Freq: Two times a day (BID) | INTRAVENOUS | Status: DC
  Administered 2012-06-26 – 2012-06-27 (×2): 40 mg via INTRAVENOUS
  Filled 2012-06-26 (×2): qty 40

## 2012-06-26 MED ORDER — SODIUM CHLORIDE 0.9 % IV SOLN
INTRAVENOUS | Status: DC
  Administered 2012-06-26: 10:00:00 via INTRAVENOUS

## 2012-06-26 MED ORDER — STERILE WATER FOR IRRIGATION IR SOLN
Status: DC | PRN
  Administered 2012-06-26: 11:00:00

## 2012-06-26 MED ORDER — MIDAZOLAM HCL 5 MG/5ML IJ SOLN
INTRAMUSCULAR | Status: AC
  Filled 2012-06-26: qty 10

## 2012-06-26 MED ORDER — MEPERIDINE HCL 50 MG/ML IJ SOLN
INTRAMUSCULAR | Status: AC
  Filled 2012-06-26: qty 1

## 2012-06-26 MED ORDER — MIDAZOLAM HCL 5 MG/5ML IJ SOLN
INTRAMUSCULAR | Status: DC | PRN
  Administered 2012-06-26 (×2): 2 mg via INTRAVENOUS

## 2012-06-26 MED ORDER — SODIUM CHLORIDE 0.9 % IV SOLN
1000.0000 mL | INTRAVENOUS | Status: DC
  Administered 2012-06-26: 1000 mL via INTRAVENOUS

## 2012-06-26 MED ORDER — HYDROMORPHONE HCL PF 1 MG/ML IJ SOLN
2.0000 mg | INTRAMUSCULAR | Status: DC | PRN
  Administered 2012-06-26 – 2012-06-27 (×5): 2 mg via INTRAVENOUS
  Filled 2012-06-26 (×2): qty 1
  Filled 2012-06-26 (×4): qty 2

## 2012-06-26 NOTE — H&P (Signed)
Thomas Black, Thomas Black NO.:  [id number]  MEDICAL RECORD NO.:  [id number]  LOCATION:  A328                          FACILITY:  [facility]  PHYSICIAN:  Kingsley Callander. [name], MD       DATE OF BIRTH:  1940/05/13  DATE OF ADMISSION:  06/24/2012 DATE OF DISCHARGE:  LH                             HISTORY & PHYSICAL   CHIEF COMPLAINT:  Abdominal pain.  HISTORY OF PRESENT ILLNESS:  This patient is a 72 year old, white male, who presented to the office complaining of a 4-day history of upper abdominal pain.  He has had belching, but no vomiting.  He has not had a bowel movement over this period of time.  He has not had rectal bleeding or melena.  He has had headaches, backaches, loss of appetite, and general fatigue.  An attempt was made to work this up as an outpatient; however, his insurance company refused to approve a CT scan.  He was, therefore, sent directly to the emergency room.  He underwent a CT scan there, which revealed changes of early pancreatitis.  He had an elevated lipase at 192.  PAST MEDICAL HISTORY: 1. Migraine headaches. 2. Hyperlipidemia. 3. Hypertension. 4. Chronic neck pain. 5. Cholecystectomy. 6. History of left ACL repair. 7. History of jaw fracture.  MEDICATIONS: 1. Atenolol 25 mg daily. 2. Aspirin 81 mg daily. 3. Flexeril 10 mg at bedtime. 4. Colace 100 mg daily. 5. Percocet p.r.n. 6. Simvastatin 40 mg daily.  ALLERGIES:  No known drug allergies.  SOCIAL HISTORY:  He does not smoke cigarettes, drink alcohol, or use recreational substances.  FAMILY HISTORY:  Noncontributory.  No history of pancreatitis in the family.  REVIEW OF SYSTEMS:  Otherwise negative.  PHYSICAL EXAMINATION:  VITAL SIGNS:  Temperature 98.5, pulse 77, respirations 20, blood pressure 135/75. GENERAL:  Alert and fully oriented, in no acute distress. HEENT:  No scleral icterus.  Nose and oropharynx are unremarkable. NECK:  Supple with no JVD or thyromegaly. LUNGS:   Clear. HEART:  Regular with no murmurs. ABDOMEN:  Exquisitely tender in the upper abdomen with no palpable organomegaly.  Bowel sounds are present. EXTREMITIES:  Reveal no cyanosis, clubbing, or edema. NEURO:  No focal weakness. LYMPH NODES:  No cervical or supraclavicular enlargement. SKIN:  Warm and dry.  LABORATORY DATA:  Lipase 192.  Sodium 135, potassium 4.9, bicarb 27, BUN 20, creatinine 1.19.  Albumin 4.3, AST 18, ALT 15, total bilirubin 0.9. White count 15.7, hemoglobin 16.8, platelets 207,000, 88 segs, 4 lymphs. Urinalysis reveals trace protein, 15 mg/dL of ketones.  EKG reveals normal sinus rhythm.  IMPRESSION/PLAN: 1. Acute pancreatitis.  He has no obvious reason to have pancreatitis.     He does not drink alcohol.  He has had his gallbladder removed.  He     does not have history of hypertriglyceridemia.  We will hospitalize     him and treat him with IV fluids and p.r.n. Dilaudid.  He will be     kept n.p.o.  A Gastroenterology consultation will be obtained. 2. Migraine headaches. 3. Hypertension. 4. Hypercholesterolemia. 5. Chronic neck pain.     Kingsley Callander. [name], MD  ROF/MEDQ  D:  06/25/2012  T:  06/26/2012  Job:  [id number]

## 2012-06-26 NOTE — Progress Notes (Signed)
[name], [name] NO.:  [id number]  MEDICAL RECORD NO.:  [id number]  LOCATION:                                 FACILITY:  PHYSICIAN:  Kingsley Callander. [name], MD       DATE OF BIRTH:  1940/10/20  DATE OF PROCEDURE:  06/25/2012 DATE OF DISCHARGE:                                PROGRESS NOTE   He is feeling less pain this morning.  He required multiple doses of Dilaudid, however overnight, he denies vomiting.  He has not had a bowel movement.  He had a maximum temperature last night of 102.2.  PHYSICAL EXAMINATION:  VITAL SIGNS:  His temperature this morning is 100.5, the pulse of 98, respirations 20, and blood pressure 145/75. GENERAL:  He is alert and in no distress. LUNGS:  Clear. HEART:  Regular with no murmurs. ABDOMEN:  Less tender in the epigastrium.  No palpable organomegaly.  IMPRESSION/PLAN:  Acute pancreatitis.  His lipase has improved from 192- 73.  His white blood cell count is slightly higher from 15.7-16.3. Bilirubin has increased from 0.9-1.3.  However, his transaminases are normal.  We will continue n.p.o. status, IV fluids, and Dilaudid p.r.n. We will discuss with Gastroenterology.  Blood cultures will be obtained.     Kingsley Callander. [name], MD     ROF/MEDQ  D:  06/25/2012  T:  06/26/2012  Job:  [id number]

## 2012-06-26 NOTE — Progress Notes (Signed)
Patient seen earlier today. Continues with epigastric pain and frequent burping. Abdominal exam reveals mild to moderate midepigastric tenderness. No guarding noted. Amylase and lipase are normal. LFTs were also normal except albumin of 2.9. WBCs 13.4 H&H 12.8 and 36.6 and platelets 168K. Will proceed with EGD.

## 2012-06-26 NOTE — Progress Notes (Signed)
UR Chart Review Completed  

## 2012-06-26 NOTE — Op Note (Signed)
EGD PROCEDURE REPORT  PATIENT:  Thomas Black  MR#:  [id number] Birthdate:  03-07-1940, 72 y.o., male Endoscopist:  Dr. [name], MD Referred By:  Dr. [name], MD  Procedure Date: 06/26/2012  Procedure:   EGD  Indications:  Patient is 72 year old Caucasian male who presents with acute pancreatitis. No etiology determined. Patient has been on NSAIDs and he is therefore undergoing EGD to make sure he does not have peptic ulcer disease.            Informed Consent:  The risks, benefits, alternatives & imponderables which include, but are not limited to, bleeding, infection, perforation, drug reaction and potential missed lesion have been reviewed.  The potential for biopsy, lesion removal, esophageal dilation, etc. have also been discussed.  Questions have been answered.  All parties agreeable.  Please see history & physical in medical record for more information.  Medications:  Demerol 50 mg IV Versed 4 mg IV Cetacaine spray topically for oropharyngeal anesthesia  Description of procedure:  The endoscope was introduced through the mouth and advanced to the second portion of the duodenum without difficulty or limitations. The mucosal surfaces were surveyed very carefully during advancement of the scope and upon withdrawal.  Findings:  Esophagus:  Mucosa of the esophagus was normal. GE junction was unremarkable. GEJ:  38 cm Hiatus:  41 cm Stomach:  Stomach was empty other than small amount of bile. Stomach distended very well with insufflation. Folds in the proximal stomach are normal. Scattered scars noted along with patchy gastritis at antrum. 3 ulcers also present in the antrum. The largest one was 6 mm and the others were small. Pyloric channel was patent. Angularis fundus and cardia but examined by retroflexion of scope in the normal. Duodenum:  Normal bulbar and post bulbar mucosa. Ampulla of vater was also examined and was normal to  Therapeutic/Diagnostic Maneuvers Performed:   None  Complications:  None  Impression: Small sliding hiatal hernia. Three small gastric ulcers in the antrum along with multiple antral scars. Normal ampulla of Vater.  Recommendations:  Clear liquids. If no problems and advanced to full liquids later today. Serum amylase and lipase in a.m. CBC in a.m. Change pantoprazole to 40 mg IV every 12 hours. H. pylori serology.  REHMAN,NAJEEB U  06/26/2012  11:15 AM  CC: Dr. [name], MD & Dr. Bonnetta Barry ref. provider found

## 2012-06-27 ENCOUNTER — Encounter (HOSPITAL_COMMUNITY): Payer: Self-pay | Admitting: Internal Medicine

## 2012-06-27 DIAGNOSIS — K859 Acute pancreatitis without necrosis or infection, unspecified: Secondary | ICD-10-CM

## 2012-06-27 DIAGNOSIS — K259 Gastric ulcer, unspecified as acute or chronic, without hemorrhage or perforation: Secondary | ICD-10-CM

## 2012-06-27 LAB — CBC
Platelets: 199 10*3/uL (ref 150–400)
RBC: 3.98 MIL/uL — ABNORMAL LOW (ref 4.22–5.81)
RDW: 13.3 % (ref 11.5–15.5)
WBC: 10 10*3/uL (ref 4.0–10.5)

## 2012-06-27 LAB — AMYLASE: Amylase: 20 U/L (ref 0–105)

## 2012-06-27 MED ORDER — SODIUM CHLORIDE 0.9 % IV SOLN
1000.0000 mL | INTRAVENOUS | Status: DC
  Administered 2012-06-27: 1000 mL via INTRAVENOUS

## 2012-06-27 MED ORDER — MAGNESIUM CITRATE PO SOLN
1.0000 | Freq: Once | ORAL | Status: AC
  Administered 2012-06-27: 1 via ORAL
  Filled 2012-06-27: qty 296

## 2012-06-27 NOTE — Progress Notes (Signed)
Pt reports having three bowel movements this afternoon.  Dr. [name] notified at his office.

## 2012-06-27 NOTE — Progress Notes (Signed)
AVS reviewed with patient and patient's wife.  Prescription for Pantoprazole provided to patient.  Pt verbalized understanding of d/c instructions.  Teach back method used.  Pt's IV removed.  Site WNL.   Pt states all belongings are intact and in possession.  Pt transported by NT via w/c to main entrance for d/c.

## 2012-06-27 NOTE — Progress Notes (Signed)
NAMELEOCADIO, HEAL NO.:  [id number]  MEDICAL RECORD NO.:  [id number]  LOCATION:                                 FACILITY:  PHYSICIAN:  Kingsley Callander. [name], MD            DATE OF BIRTH:  DATE OF PROCEDURE:  06/26/2012 DATE OF DISCHARGE:                                PROGRESS NOTE   Mr. Justice Britain states he had a bad night.  He complains of headaches.  He states his abdominal pain is a little better.  He is afebrile this morning with a temperature of 98.6.  He has had a maximum temperature over the past 24 hours of 99.2, pulse is 82, blood pressure 135/65.  No distress.  Lungs clear.  Heart regular with no murmurs.  Abdomen soft, less tender in the epigastrium.  No palpable organomegaly.  Neuro stable.  IMPRESSION/PLAN: 1. Pancreatitis.  His amylase and lipase are now normal at 24 and 22.     His white count has improved to 13.4.  His transaminases are     normal.  His triglycerides are normal at 83.  He still has very     little appetite.  Discussed with Dr. Karilyn Cota yesterday.  We will     discuss the case with him further today.  An upper endoscopy has     been considered.  Continue Protonix. 2. History of tick bites.  The tick bites he evidently described     earlier were remote.  He denies any recent tick bites.  He has been     started on doxycycline.     Kingsley Callander. [name], MD     ROF/MEDQ  D:  06/26/2012  T:  06/27/2012  Job:  [id number]

## 2012-06-27 NOTE — Progress Notes (Signed)
Subjective:  Patient feels better. Wants to go home. Tolerated advanced diet without pain. Worried about not having BM in 7 days but no urge at this point. No solid foods in a week until last 24 hours. Took bottle of magcitrate this am without results so far.  Objective: Vital signs in last 24 hours: Temp:  [98.1 F (36.7 C)-98.2 F (36.8 C)] 98.1 F (36.7 C) (05/01 0700) Pulse Rate:  [75-79] 79 (05/01 0700) Resp:  [20] 20 (05/01 0700) BP: (148-167)/(77-93) 162/93 mmHg (05/01 0700) SpO2:  [94 %-96 %] 95 % (05/01 0700) Last BM Date: 06/21/12 General:   Alert,  Well-developed, well-nourished, pleasant and cooperative in NAD. Accompanied by wife.  Head:  Normocephalic and atraumatic. Eyes:  Sclera clear, no icterus.   Abdomen:  Soft, nontender and nondistended.  Normal bowel sounds, without guarding, and without rebound.   Extremities:  Without clubbing, deformity or edema. Neurologic:  Alert and  oriented x4;  grossly normal neurologically. Skin:  Intact without significant lesions or rashes. Psych:  Alert and cooperative. Normal mood and affect.  Intake/Output from previous day: 04/30 0701 - 05/01 0700 In: 1387.5 [I.V.:1387.5] Out: 1900 [Urine:1900] Intake/Output this shift: Total I/O In: 120 [P.O.:120] Out: -   Lab Results: CBC  Recent Labs  06/25/12 0521 06/26/12 0518 06/27/12 0505  WBC 16.3* 13.4* 10.0  HGB 14.1 12.8* 12.7*  HCT 39.7 36.6* 36.2*  MCV 91.7 91.7 91.0  PLT 167 168 199   BMET  Recent Labs  06/24/12 1254 06/25/12 0522  NA 135 134*  K 4.9 3.9  CL 98 100  CO2 27 26  GLUCOSE 116* 110*  BUN 20 14  CREATININE 1.19 1.17  CALCIUM 9.9 8.7   LFTs  Recent Labs  06/24/12 1254 06/25/12 0522 06/26/12 0518  BILITOT 0.9 1.3* 0.7  BILIDIR  --   --  0.2  IBILI  --   --  0.5  ALKPHOS 87 73 75  AST 18 19 17   ALT 15 15 14   PROT 8.2 6.6 6.4  ALBUMIN 4.3 3.2* 2.9*    Recent Labs  06/25/12 0522 06/26/12 0518 06/27/12 0505  LIPASE 73* 22 18    PT/INR No results found for this basename: LABPROT, INR,  in the last 72 hours    Imaging Studies: Ct Abdomen Pelvis W Contrast  06/24/2012  *RADIOLOGY REPORT*  Clinical Data: Abdominal pain and cramping for 3 days.  Previous cholecystectomy.  Nausea.  CT ABDOMEN AND PELVIS WITH CONTRAST  Technique:  Multidetector CT imaging of the abdomen and pelvis was performed following the standard protocol during bolus administration of intravenous contrast.  Contrast: 50mL OMNIPAQUE IOHEXOL 300 MG/ML  SOLN, OMNIPAQUE IOHEXOL 300 MG/ML  SOLN  Comparison: 02/28/2010  Findings: Lung bases show mild scarring and atelectasis.  No pleural or pericardial fluid.  The liver contains several benign cysts, unchanged since the previous study, the largest in the left lobe ventrally measuring 14 mm in diameter.  10 mm cyst along the dorsal surface of the right lobe is also unchanged.  There has been previous cholecystectomy. No ductal dilatation.  The spleen is normal.  The pancreas may be associated with very mild stranding/edema raising the question of early pancreatitis.  If lab data is normal, this may not to be a valid finding.  The adrenal glands are normal.  1 cm cyst at the upper pole left kidney is present.  5 mm cyst in the ventral midportion of the right kidney is present.  No  acute or significant renal finding.  The aorta shows mild atherosclerosis but no aneurysm.  The IVC is normal.  No retroperitoneal mass or adenopathy.  Normal appearing appendix.  No acute bowel pathology. Bladder, prostate gland and seminal vesicles are unremarkable. Minimal degenerative changes effect the spine.  IMPRESSION: Linear scarring and atelectasis at the lung bases.  Question the earliest imaging findings of pancreatitis.  This is not definite.  If lab evaluation is normal, I would be inclined to disregard that suggestion.  No other finding to explain abdominal pain.   Original Report Authenticated By: Paulina Fusi, M.D.   [2  weeks]   Assessment: 72 y/o male with pancreatitis who is improving. Abdominal pain better. Tolerating diet. EGD showed three small gastric ulcers in the antrum along with multiple antral scars. H.pylori serologies pending. As outpatient on chronic Aleve and Zantac, not a PPI.  Plan: 1. F/u h.pylori serolgies. 2. pantoprazole 40mg  bid as outpatient. 3. OK for discharge from GI standpoint. 4. F/U with Dr. Karilyn Cota. ?need EUS for pancreatitis of questionable etiology and EGD in 3 months to verify resolution of gastric ulcers. 5. Avoid NSAIDs for now.   LOS: 3 days   Tana Coast  06/27/2012, 11:52 AM

## 2012-06-27 NOTE — Care Management Note (Signed)
    Page 1 of 1   06/27/2012     3:33:56 PM   CARE MANAGEMENT NOTE 06/27/2012  Patient:  Thomas Black, Thomas Black   Account Number:  [id number]  Date Initiated:  06/27/2012  Documentation initiated by:  Rosemary Holms  Subjective/Objective Assessment:   Pt admitted from home where he lives with spouse. No HH needs identified. Independent with ADL.     Action/Plan:   Anticipate DC home later today.   Anticipated DC Date:  06/27/2012   Anticipated DC Plan:  HOME/SELF CARE      DC Planning Services  CM consult      Choice offered to / List presented to:             Status of service:  Completed, signed off Medicare Important Message given?  YES (If response is "NO", the following Medicare IM given date fields will be blank) Date Medicare IM given:  06/27/2012 Date Additional Medicare IM given:    Discharge Disposition:  HOME/SELF CARE  Per UR Regulation:    If discussed at Long Length of Stay Meetings, dates discussed:    Comments:  06/27/12 Rosemary Holms RN BSN CM

## 2012-06-28 LAB — H. PYLORI ANTIBODY, IGG: H Pylori IgG: 0.47 {ISR}

## 2012-06-28 NOTE — Discharge Summary (Signed)
Thomas Black, Thomas Black NO.:  [id number]  MEDICAL RECORD NO.:  [id number]  LOCATION:  A328                          FACILITY:  [facility]  PHYSICIAN:  Kingsley Callander. [name], MD       DATE OF BIRTH:  1940-04-22  DATE OF ADMISSION:  06/24/2012 DATE OF DISCHARGE:  05/01/2014LH                              DISCHARGE SUMMARY   DISCHARGE DIAGNOSES: 1. Acute pancreatitis. 2. Gastric ulcers. 3. History of migraine headaches. 4. Hyperlipidemia. 5. Hypertension. 6. Chronic neck pain.  HOSPITAL COURSE:  This patient is a 72 year old male who presented with a four-day history of upper abdominal pain with associated belching and nausea but no vomiting.  He was found to be significantly tender in the upper abdomen with an elevated lipase of 192.  He underwent a CT scan of the abdomen which was consistent with acute pancreatitis.  He had previously had a cholecystectomy.  There was no sign of bile duct or pancreatic duct abnormality.  He was hospitalized and treated with bowel rest, IV fluids, and Dilaudid as needed.  A Gastroenterology consultation was obtained with Dr. Karilyn Cota.  His symptoms gradually improved.  He experienced a fever to 102.  Blood cultures have been negative.  No other source of infection was identified.  He had a history of remote tick bites and was treated with doxycycline empirically.  He had no IgG titer to [facility] spotted fever, however, his IgM titer was normal.  He underwent an upper endoscopy which revealed 3 small gastric ulcers. He was treated with intravenous pantoprazole.  The patient had a history of using up to 6 Aleve per day and was also on an 81 mg aspirin daily. These were stopped.  There was no significant drop in hemoglobin or evidence of GI bleeding.  He initially had a leukocytosis which normalized throughout his hospitalization.  As his pain resolved and his pancreatic enzymes normalized, his diet was then advanced to a clear liquid  diet and then a bland diet.  He tolerated this well and was stable for discharge on the evening of the first.  He will be seen in followup in my office in 1 week.  His condition at discharge is much improved.  He has been advised to discontinue both of Aleve and aspirin.  He will add pantoprazole daily.  Please note that the patient does not drink alcohol, nor does he have a hypertriglyceridemia and his lipids revealed a low triglyceride level now.  DISCHARGE MEDICATIONS:  Atenolol 25 mg daily, Flexeril 10 mg daily, Colace 100 mg daily, Percocet q.4 p.r.n., simvastatin 40 mg daily, pantoprazole 40 mg daily.     Kingsley Callander. [name], MD     ROF/MEDQ  D:  06/28/2012  T:  06/28/2012  Job:  [id number]

## 2012-06-30 LAB — CULTURE, BLOOD (ROUTINE X 2): Culture: NO GROWTH

## 2012-10-30 ENCOUNTER — Other Ambulatory Visit (HOSPITAL_COMMUNITY): Payer: Self-pay | Admitting: Orthopaedic Surgery

## 2012-10-30 DIAGNOSIS — M25511 Pain in right shoulder: Secondary | ICD-10-CM

## 2012-11-05 ENCOUNTER — Ambulatory Visit (HOSPITAL_COMMUNITY)
Admission: RE | Admit: 2012-11-05 | Discharge: 2012-11-05 | Disposition: A | Discharge status: Home / Self Care | Payer: Medicare Other | Source: Ambulatory Visit | Attending: Orthopaedic Surgery | Admitting: Orthopaedic Surgery

## 2012-11-05 DIAGNOSIS — M75100 Unspecified rotator cuff tear or rupture of unspecified shoulder, not specified as traumatic: Secondary | ICD-10-CM | POA: Insufficient documentation

## 2012-11-05 DIAGNOSIS — M25519 Pain in unspecified shoulder: Secondary | ICD-10-CM | POA: Insufficient documentation

## 2012-11-05 DIAGNOSIS — IMO0002 Reserved for concepts with insufficient information to code with codable children: Secondary | ICD-10-CM | POA: Insufficient documentation

## 2012-11-05 DIAGNOSIS — M898X9 Other specified disorders of bone, unspecified site: Secondary | ICD-10-CM | POA: Insufficient documentation

## 2012-11-05 DIAGNOSIS — M25511 Pain in right shoulder: Secondary | ICD-10-CM

## 2012-11-14 ENCOUNTER — Other Ambulatory Visit: Payer: Self-pay | Admitting: Dermatology

## 2012-12-19 ENCOUNTER — Ambulatory Visit (HOSPITAL_COMMUNITY)
Admission: RE | Admit: 2012-12-19 | Discharge: 2012-12-19 | Disposition: A | Discharge status: Home / Self Care | Payer: Medicare Other | Source: Ambulatory Visit | Attending: Orthopaedic Surgery | Admitting: Orthopaedic Surgery

## 2012-12-19 DIAGNOSIS — M6281 Muscle weakness (generalized): Secondary | ICD-10-CM | POA: Insufficient documentation

## 2012-12-19 DIAGNOSIS — M25519 Pain in unspecified shoulder: Secondary | ICD-10-CM | POA: Insufficient documentation

## 2012-12-19 DIAGNOSIS — M25619 Stiffness of unspecified shoulder, not elsewhere classified: Secondary | ICD-10-CM | POA: Insufficient documentation

## 2012-12-19 DIAGNOSIS — M25511 Pain in right shoulder: Secondary | ICD-10-CM

## 2012-12-19 DIAGNOSIS — IMO0001 Reserved for inherently not codable concepts without codable children: Secondary | ICD-10-CM | POA: Insufficient documentation

## 2012-12-19 DIAGNOSIS — Z9889 Other specified postprocedural states: Secondary | ICD-10-CM

## 2012-12-19 NOTE — Evaluation (Addendum)
Occupational Therapy Evaluation  Patient Details  Name: Thomas Black. MRN: [id number] Date of Birth: August 03, 1940  Today's Date: 12/19/2012 Time: 0865-7846 OT Time Calculation (min): 42 min Evaluation 9629-52841 TherExercises 1002-1014  Visit#: 1 of 12  Re-eval: 01/30/13  Assessment Diagnosis: s/p Right Shoulder Surgery/Scope 11/21/2012 Surgical Date: 11/21/12 Next MD Visit: 01/08/13 Prior Therapy: none on Right shoulder    Past Medical History:  Past Medical History  Diagnosis Date  . Hypertension   . Hypercholesteremia   . Headache(784.0)   . Chronic neck pain   . Muscle spasms of neck   . Migraine    Past Surgical History:  Past Surgical History  Procedure Laterality Date  . Fractured nose    . Fractured jaw    . Cholecystectomy      1/12  . Left acl repair    . Left knee arthroscopy    . Colonoscopy  03/15/2011    Procedure: COLONOSCOPY;  Surgeon: [name], MD;  Location: [facility];  Service: Endoscopy;  Laterality: N/A;  9:30 am  . Tibia fracture surgery    . Esophagogastroduodenoscopy N/A 06/26/2012    Procedure: ESOPHAGOGASTRODUODENOSCOPY (EGD);  Surgeon: [name], MD;  Location: [facility];  Service: Endoscopy;  Laterality: N/A;    Subjective Symptoms/Limitations Symptoms: pain with long arm lever movement, lifting objects, reaching up in lateral plane, extension  Pertinent History: Patient states it has been progressive declijne in right shoulder movement and strength Limitations: functional reaching, lifting Patient Stated Goals: To regain use of dominant arm Pain Assessment Currently in Pain?: No/denies Pain Score: 3  Pain Location: Shoulder Pain Orientation: Right Pain Type: Acute pain Multiple Pain Sites: No  Precautions/Restrictions  Precautions Precautions: None Restrictions Weight Bearing Restrictions: No  Balance Screening Balance Screen Has the patient fallen in the past 6 months: No Has the patient had a  decrease in activity level because of a fear of falling? : No Is the patient reluctant to leave their home because of a fear of falling? : No  Prior Functioning  Home Living Family/patient expects to be discharged to:: Private residence Living Arrangements: Spouse/significant other Prior Function Level of Independence: Independent with basic ADLs Driving: Yes  Assessment ADL/Vision/Perception ADL ADL Comments: Patient reports independence with ADL tasks with increased complaint of pain and modified range of motion  Dominant Hand: Right  Cognition/Observation Cognition Overall Cognitive Status: Within Functional Limits for tasks assessed Arousal/Alertness: Awake/alert Orientation Level: Oriented X4   Additional Assessments RUE AROM (degrees) Right Shoulder Extension: 58 Degrees Right Shoulder Flexion: 136 Degrees Right Shoulder ABduction: 125 Degrees Right Shoulder Internal Rotation: 80 Degrees Right Shoulder External Rotation: 68 Degrees     Exercise/Treatments Supine Protraction: AAROM;10 reps External Rotation: AAROM;10 reps Internal Rotation: AAROM;10 reps Flexion: AAROM;10 reps ABduction: AAROM;10 reps        Occupational Therapy Assessment and Plan OT Assessment and Plan Clinical Impression Statement: Patient is a 72 year old right handed male s/p Right shoulder arthroscopy who presents with increased pain and fascial restrictions, decreased functional A/PROM and strength causing decreased independence with all B/IADL and leisure tasks.   Pt will benefit from skilled therapeutic intervention in order to improve on the following deficits: Decreased strength;Impaired UE functional use;Decreased range of motion;Decreased activity tolerance;Pain;Increased fascial restricitons OT Frequency: Min 2X/week OT Duration: 6 weeks OT Treatment/Interventions: Therapeutic activities;Therapeutic exercise;Manual therapy;Modalities;Patient/family education OT Plan: P: Recommend  patient would benefit from skilled OT services to maximize functional potential/independence, decrease pain and fascial restrictions  and increase pain free mobilty in right shoulder in order to return to prior level with all B/IADL and leisure activities.   Treatment Plan:  Progress as tolerated.  A/AAROM in supine, seated range and ball stretches.      Goals Home Exercise Program Pt/caregiver will Perform Home Exercise Program: For increased ROM;For increased strengthening;Independently PT Goal: Perform Home Exercise Program - Progress: Goal set today Short Term Goals Time to Complete Short Term Goals: 3 weeks Short Term Goal 1: Patient will be educated on HEP Short Term Goal 2: Patient will have 4-/5 strength in RUE for increased ability to participate in daily activities Short Term Goal 3: Patiet will report decreasd complaint of pain to 2/10during daily tasks/activiteis Short Term Goal 4: Patient will improve AROM in supine to [facility] to increase functional use in daily tasks.   Short Term Goal 5: Patient will have min/mod fascial restrictions in his RUE Long Term Goals Time to Complete Long Term Goals: 6 weeks Long Term Goal 1: Patient will return to highest level of independence with all leisure and daily activities Long Term Goal 2: Patient will decrease pain in right shoulder to  </= 1/10 when completed daily ADL/leisure tasks.  Long Term Goal 3: Patientwill have 4+/5 sterngth in hir RUE for increased ability to participate in daily activities and leisure hobbies.  Long Term Goal 4: Patient will have trace/min fascial restrictions in RUE  Long Term Goal 5: Patient will increased AROM to WNL in right shoulder for increased independence for long arm activities, reaching overhead and into cabinets.    Problem List Patient Active Problem List   Diagnosis Date Noted  . s/p Shoulder arthroscopy 12/19/2012  . Pain in joint, shoulder region 12/19/2012  . Muscle weakness (generalized) 12/19/2012     End of Session Activity Tolerance: Patient tolerated treatment well General Behavior During Therapy: [facility] for tasks assessed/performed OT Plan of Care OT Home Exercise Plan: shoulder stretches OT Patient Instructions: demonstrated with return demonstration Consulted and Agree with Plan of Care: Patient  GO Functional Assessment Tool Used: FOTO Functional Limitation: Carrying, moving and handling objects Carrying, Moving and Handling Objects Current Status ([id number]): At least 20 percent but less than 40 percent impaired, limited or restricted Carrying, Moving and Handling Objects Goal Status [phone]): At least 1 percent but less than 20 percent impaired, limited or restricted    Velora Mediate, OTR/L  12/19/2012, 1:36 PM  Physician Documentation Your signature is required to indicate approval of the treatment plan as stated above.  Please sign and either send electronically or make a copy of this report for your files and return this physician signed original.  Please mark one 1.__approve of plan  2. ___approve of plan with the following conditions.   ______________________________                                                          _____________________ Physician Signature  Date  

## 2012-12-23 ENCOUNTER — Ambulatory Visit (HOSPITAL_COMMUNITY): Payer: Medicare Other | Admitting: Occupational Therapy

## 2012-12-25 ENCOUNTER — Ambulatory Visit (HOSPITAL_COMMUNITY)
Admission: RE | Admit: 2012-12-25 | Discharge: 2012-12-25 | Disposition: A | Discharge status: Home / Self Care | Payer: Medicare Other | Source: Ambulatory Visit | Attending: Orthopaedic Surgery | Admitting: Orthopaedic Surgery

## 2012-12-26 NOTE — Progress Notes (Signed)
Occupational Therapy Treatment Patient Details  Name: Thomas Black. MRN: [id number] Date of Birth: 10/11/1940  Today's Date: 12/25/2012 Time: 1105-1150 OT Time Calculation (min): 45 min Manual 1105-1125 (20') TherExercise 1125-1150 )25')  Visit#: 2 of 12  Re-eval: 01/30/13 Assessment Diagnosis: s/p Right Shoulder Surgery/Scope 11/21/2012 Surgical Date: 11/21/12 Next MD Visit: 01/08/13 Prior Therapy: none on Right shoulder   Authorization: [employer]  Authorization Time Period: Before 10th visit  Authorization Visit#: 2 of 10  Subjective Symptoms/Limitations Symptoms: S: I think I over did it yesterday a bit with reaching like I thought it should be able to do Pertinent History: Patient is a 72 year old right handed male who states his shoulder has been progressively declining in movment and strength.  Patient now s/p right shoulder arthroscopic surgery and is now referred for Occupational Therapy for evaluation and treatment.   Limitations: functional reaching, lifting Repetition: Increases Symptoms Patient Stated Goals: To regain use of dominant arm Pain Assessment Currently in Pain?: Yes Pain Score: 4  Pain Location: Shoulder Pain Orientation: Right Pain Type: Acute pain Multiple Pain Sites: No  Precautions/Restrictions  Precautions Precautions: None Restrictions Weight Bearing Restrictions: No  Exercise/Treatments Supine Protraction: AAROM;10 reps External Rotation: PROM;AAROM;10 reps Internal Rotation: PROM;AAROM;10 reps Flexion: PROM;AAROM;10 reps ABduction: PROM;AAROM;10 reps Seated Elevation: AROM;12 reps Extension: AROM;12 reps Row: AROM;12 reps Flexion: AAROM;10 reps Abduction: AAROM;10 reps Therapy Ball Flexion: 15 reps ABduction: 15 reps ROM / Strengthening / Isometric Strengthening Wall Wash: 1'           Manual Therapy Manual Therapy: Myofascial release Myofascial Release: MFR to right upper arm, trapezius and scapularis region to  decrease fascial restrictions and increase joint mobility in a pain free zone  Occupational Therapy Assessment and Plan OT Assessment and Plan Clinical Impression Statement: Patient is a 72 year old right handed male s/p Right shoulder arthroscopy who presents with increased pain and fascial restrictions, decreased functional A/PROM and strength causing decreased independence with all B/IADL and leisure tasks.   Pt will benefit from skilled therapeutic intervention in order to improve on the following deficits: Decreased strength;Impaired UE functional use;Decreased range of motion;Decreased activity tolerance;Pain;Increased fascial restricitons OT Frequency: Min 2X/week OT Duration: 6 weeks OT Treatment/Interventions: Therapeutic activities;Therapeutic exercise;Manual therapy;Modalities;Patient/family education OT Plan: P: Progress as tolerated A/AAROM in supine, isometircs, seated range.    Goals Home Exercise Program Pt/caregiver will Perform Home Exercise Program: For increased ROM;For increased strengthening;Independently PT Goal: Perform Home Exercise Program - Progress: Goal set today Short Term Goals Time to Complete Short Term Goals: 3 weeks Short Term Goal 1: Patient will be educated on HEP Short Term Goal 1 Progress: Progressing toward goal Short Term Goal 2: Patient will have 4-/5 strength in RUE for increased ability to participate in daily activities Short Term Goal 2 Progress: Progressing toward goal Short Term Goal 3: Patiet will report decreasd complaint of pain to 2/10during daily tasks/activiteis Short Term Goal 3 Progress: Progressing toward goal Short Term Goal 4: Patient will improve AROM in supine to [employer] to increase functional use in daily tasks.   Short Term Goal 4 Progress: Progressing toward goal Short Term Goal 5: Patient will have min/mod fascial restrictions in his RUE Short Term Goal 5 Progress: Progressing toward goal Long Term Goals Time to Complete Long  Term Goals: 6 weeks Long Term Goal 1: Patient will return to highest level of independence with all leisure and daily activities Long Term Goal 1 Progress: Progressing toward goal Long Term Goal 2: Patient will  decrease pain in right shoulder to  </= 1/10 when completed daily ADL/leisure tasks.  Long Term Goal 2 Progress: Progressing toward goal Long Term Goal 3: Patientwill have 4+/5 sterngth in hir RUE for increased ability to participate in daily activities and leisure hobbies.  Long Term Goal 3 Progress: Progressing toward goal Long Term Goal 4: Patient will have trace/min fascial restrictions in RUE  Long Term Goal 4 Progress: Progressing toward goal Long Term Goal 5: Patient will increased AROM to WNL in right shoulder for increased independence for long arm activities, reaching overhead and into cabinets.   Long Term Goal 5 Progress: Progressing toward goal  Problem List Patient Active Problem List   Diagnosis Date Noted  . s/p Shoulder arthroscopy 12/19/2012  . Pain in joint, shoulder region 12/19/2012  . Muscle weakness (generalized) 12/19/2012    End of Session Activity Tolerance: Patient tolerated treatment well General Behavior During Therapy: [facility] for tasks assessed/performed OT Plan of Care OT Home Exercise Plan: shoulder stretches OT Patient Instructions: demonstrated with return demonstration Consulted and Agree with Plan of Care: Patient  GO Functional Assessment Tool Used: FOTO Functional Limitation: Carrying, moving and handling objects Carrying, Moving and Handling Objects Current Status (Z6109): At least 20 percent but less than 40 percent impaired, limited or restricted Carrying, Moving and Handling Objects Goal Status [phone]): At least 1 percent but less than 20 percent impaired, limited or restricted  Velora Mediate, OTR/L  12/25/2012, 2:02 PM

## 2012-12-26 NOTE — Evaluation (Signed)
Occupational Therapy Evaluation  Patient Details  Name: Thomas Black. MRN: [id number] Date of Birth: November 04, 1940  Today's Date: 12/19/2012 Time: 4098-1191 OT Time Calculation (min): 42 min Evaluation  0932-1002 (30) TherExercise1002-1014 (12')  Visit#: 1 of 12  Re-eval: 01/30/13  Assessment Diagnosis: s/p Right Shoulder Surgery/Scope 11/21/2012 Surgical Date: 11/21/12 Next MD Visit: 01/08/13 Prior Therapy: none on Right shoulder    Past Medical History:  Past Medical History  Diagnosis Date  . Hypertension   . Hypercholesteremia   . Headache(784.0)   . Chronic neck pain   . Muscle spasms of neck   . Migraine    Past Surgical History:  Past Surgical History  Procedure Laterality Date  . Fractured nose    . Fractured jaw    . Cholecystectomy      1/12  . Left acl repair    . Left knee arthroscopy    . Colonoscopy  03/15/2011    Procedure: COLONOSCOPY;  Surgeon: [name], MD;  Location: [facility];  Service: Endoscopy;  Laterality: N/A;  9:30 am  . Tibia fracture surgery    . Esophagogastroduodenoscopy N/A 06/26/2012    Procedure: ESOPHAGOGASTRODUODENOSCOPY (EGD);  Surgeon: [name], MD;  Location: [facility];  Service: Endoscopy;  Laterality: N/A;    Subjective Symptoms/Limitations Symptoms: pain with long arm lever movement, lifting objects, reaching up in lateral plane, extension  Pertinent History: Patient is a 72 year old right handed male who states his shoulder has been progressively declining in movment and strength.  Patient now s/p right shoulder arthroscopic surgery and is now referred for Occupational Therapy for evaluation and treatment.   Limitations: functional reaching, lifting Repetition: Increases Symptoms Patient Stated Goals: To regain use of dominant arm Pain Assessment Currently in Pain?: Yes Pain Score: 3  Pain Location: Shoulder Pain Orientation: Right Pain Type: Acute pain;Surgical pain Multiple Pain Sites:  No  Precautions/Restrictions  Precautions Precautions: None Restrictions Weight Bearing Restrictions: No  Balance Screening Balance Screen Has the patient fallen in the past 6 months: No Has the patient had a decrease in activity level because of a fear of falling? : No Is the patient reluctant to leave their home because of a fear of falling? : No  Prior Functioning  Home Living Family/patient expects to be discharged to:: Private residence Living Arrangements: Spouse/significant other Prior Function Level of Independence: Independent with basic ADLs Driving: Yes  Assessment ADL/Vision/Perception ADL ADL Comments: Patient reports independence with ADL tasks with increased complaint of pain and modified range of motion  Dominant Hand: Right  Cognition/Observation Cognition Overall Cognitive Status: Within Functional Limits for tasks assessed Arousal/Alertness: Awake/alert Orientation Level: Oriented X4    Additional Assessments RUE AROM (degrees) Right Shoulder Extension: 58 Degrees Right Shoulder Flexion: 136 Degrees Right Shoulder ABduction: 125 Degrees Right Shoulder Internal Rotation: 80 Degrees Right Shoulder External Rotation: 68 Degrees RUE Strength Right Shoulder Flexion: 3/5 Right Shoulder ABduction: 3/5 Right Shoulder Internal Rotation: 3/5 Right Shoulder External Rotation: 3/5     Exercise/Treatments Supine Protraction: AAROM;10 reps External Rotation: AAROM;10 reps Internal Rotation: AAROM;10 reps Flexion: AAROM;10 reps ABduction: AAROM;10 reps               Occupational Therapy Assessment and Plan OT Assessment and Plan Clinical Impression Statement: Patient is a 72 year old right handed male s/p Right shoulder arthroscopy who presents with increased pain and fascial restrictions, decreased functional A/PROM and strength causing decreased independence with all B/IADL and leisure tasks.  Pt will benefit from skilled therapeutic  intervention in order to improve on the following deficits: Decreased strength;Impaired UE functional use;Decreased range of motion;Decreased activity tolerance;Pain;Increased fascial restricitons OT Frequency: Min 2X/week OT Duration: 6 weeks OT Treatment/Interventions: Therapeutic activities;Therapeutic exercise;Manual therapy;Modalities;Patient/family education OT Plan: P: Recommend patient would benefit from skilled OT services to maximize functional potential/independence, decrease pain and fascial restrictions and increase pain free mobilty in right shoulder in order to return to prior level with all B/IADL and leisure activities.   Treatment Plan:  Progress as tolerated.  A/AAROM in supine, seated range and ball stretches.      Goals Home Exercise Program Pt/caregiver will Perform Home Exercise Program: For increased ROM;For increased strengthening;Independently PT Goal: Perform Home Exercise Program - Progress: Goal set today Short Term Goals Time to Complete Short Term Goals: 3 weeks Short Term Goal 1: Patient will be educated on HEP Short Term Goal 2: Patient will have 4-/5 strength in RUE for increased ability to participate in daily activities Short Term Goal 3: Patiet will report decreasd complaint of pain to 2/10during daily tasks/activiteis Short Term Goal 4: Patient will improve AROM in supine to [facility] to increase functional use in daily tasks.   Short Term Goal 5: Patient will have min/mod fascial restrictions in his RUE Long Term Goals Time to Complete Long Term Goals: 6 weeks Long Term Goal 1: Patient will return to highest level of independence with all leisure and daily activities Long Term Goal 2: Patient will decrease pain in right shoulder to  </= 1/10 when completed daily ADL/leisure tasks.  Long Term Goal 3: Patientwill have 4+/5 sterngth in hir RUE for increased ability to participate in daily activities and leisure hobbies.  Long Term Goal 4: Patient will have  trace/min fascial restrictions in RUE  Long Term Goal 5: Patient will increased AROM to WNL in right shoulder for increased independence for long arm activities, reaching overhead and into cabinets.    Problem List Patient Active Problem List   Diagnosis Date Noted  . s/p Shoulder arthroscopy 12/19/2012  . Pain in joint, shoulder region 12/19/2012  . Muscle weakness (generalized) 12/19/2012    End of Session Activity Tolerance: Patient tolerated treatment well General Behavior During Therapy: [facility] for tasks assessed/performed OT Plan of Care OT Home Exercise Plan: shoulder stretches OT Patient Instructions: demonstrated with return demonstration Consulted and Agree with Plan of Care: Patient  GO    Velora Mediate, OTR/L  12/20/2012, 1:24 AM  Physician Documentation Your signature is required to indicate approval of the treatment plan as stated above.  Please sign and either send electronically or make a copy of this report for your files and return this physician signed original.  Please mark one 1.__approve of plan  2. ___approve of plan with the following conditions.   ______________________________                                                          _____________________ Physician Signature  Date  

## 2012-12-31 ENCOUNTER — Ambulatory Visit (HOSPITAL_COMMUNITY)
Admission: RE | Admit: 2012-12-31 | Discharge: 2012-12-31 | Disposition: A | Discharge status: Home / Self Care | Payer: Medicare Other | Source: Ambulatory Visit | Attending: Internal Medicine | Admitting: Internal Medicine

## 2012-12-31 DIAGNOSIS — M6281 Muscle weakness (generalized): Secondary | ICD-10-CM | POA: Insufficient documentation

## 2012-12-31 DIAGNOSIS — M25619 Stiffness of unspecified shoulder, not elsewhere classified: Secondary | ICD-10-CM | POA: Insufficient documentation

## 2012-12-31 DIAGNOSIS — IMO0001 Reserved for inherently not codable concepts without codable children: Secondary | ICD-10-CM | POA: Insufficient documentation

## 2012-12-31 DIAGNOSIS — M25519 Pain in unspecified shoulder: Secondary | ICD-10-CM | POA: Insufficient documentation

## 2012-12-31 NOTE — Progress Notes (Signed)
Occupational Therapy Treatment Patient Details  Name: Thomas Black. MRN: [id number] Date of Birth: [birth year]  Today's Date: 12/31/2012 Time: 4098-1191 OT Time Calculation (min): 43 min Manual 4782-9562 (15') TherExercises 0950-1018 (28')  Visit#: 3 of 12  Re-eval: 01/30/13    Authorization: [employer]  Authorization Time Period: Before 10th visit  Authorization Visit#: 3 of 10  Subjective Symptoms/Limitations Symptoms: S: It is alot better than the last time I was here  Pain Assessment Currently in Pain?: Yes Pain Score: 3  Pain Location: Shoulder Pain Orientation: Right Pain Type: Acute pain Multiple Pain Sites: No   Exercise/Treatments Supine Protraction: AAROM;10 reps;Strengthening;12 reps Protraction Weight (lbs): 2 Horizontal ABduction: AAROM;10 reps;Strengthening;12 reps;Weights Horizontal ABduction Weight (lbs): 2 External Rotation: AAROM;10 reps;Strengthening;12 reps;Weights External Rotation Weight (lbs): 2 Internal Rotation: AAROM;10 reps;Strengthening;12 reps;Weights Internal Rotation Weight (lbs): 2 Flexion: AAROM;10 reps;Strengthening;12 reps;Weights Shoulder Flexion Weight (lbs): 2 ABduction: AAROM;10 reps;Strengthening;12 reps;Weights Shoulder ABduction Weight (lbs): 2 Seated Elevation: AROM;12 reps;Weights Elevation Weight (lbs): 2 Extension: AROM;12 reps;Weights Extension Weight (lbs): 2 Row: AROM;12 reps;Weights Row Weight (lbs): 2 Flexion: AROM;10 reps (to 90 degrees ) Abduction: AROM;10 reps (to 90 degrees ) Therapy Ball Right/Left: 5 reps ROM / Strengthening / Isometric Strengthening Wall Wash: 2' Wall Pushups: 10 reps       Manual Therapy Manual Therapy: Myofascial release Myofascial Release: MFR to right upper arm, trapezius and scapularis region to decrease fascial restrictions and increase joint mobility in a pain free zone Weight Bearing Technique Weight Bearing Technique: No  Occupational Therapy Assessment and  Plan OT Assessment and Plan Clinical Impression Statement: A: Patient reports increasing functional use and beginning to incorp arm lightly at gym.  Tolerated 2# weights this date excellent with no increased complaint of pain.  increased weights, wall wash time and initiated close pushups on wall.   OT Plan: P: Progress as tolerated AROM, isometircs, seated range, strengthening, reaching activities.     Goals Short Term Goals Short Term Goal 1: Patient will be educated on HEP Short Term Goal 1 Progress: Progressing toward goal Short Term Goal 2: Patient will have 4-/5 strength in RUE for increased ability to participate in daily activities Short Term Goal 2 Progress: Progressing toward goal Short Term Goal 3: Patiet will report decreasd complaint of pain to 2/10during daily tasks/activiteis Short Term Goal 3 Progress: Progressing toward goal Short Term Goal 4: Patient will improve AROM in supine to [facility] to increase functional use in daily tasks.   Short Term Goal 4 Progress: Progressing toward goal Short Term Goal 5: Patient will have min/mod fascial restrictions in his RUE Short Term Goal 5 Progress: Progressing toward goal Long Term Goals Long Term Goal 1: Patient will return to highest level of independence with all leisure and daily activities Long Term Goal 1 Progress: Progressing toward goal Long Term Goal 2: Patient will decrease pain in right shoulder to  </= 1/10 when completed daily ADL/leisure tasks.  Long Term Goal 2 Progress: Progressing toward goal Long Term Goal 3: Patientwill have 4+/5 sterngth in hir RUE for increased ability to participate in daily activities and leisure hobbies.  Long Term Goal 3 Progress: Progressing toward goal Long Term Goal 4: Patient will have trace/min fascial restrictions in RUE  Long Term Goal 4 Progress: Progressing toward goal Long Term Goal 5: Patient will increased AROM to WNL in right shoulder for increased independence for long arm  activities, reaching overhead and into cabinets.   Long Term Goal 5 Progress: Progressing toward goal  Problem List Patient Active Problem List   Diagnosis Date Noted  . s/p Shoulder arthroscopy 12/19/2012  . Pain in joint, shoulder region 12/19/2012  . Muscle weakness (generalized) 12/19/2012    End of Session Activity Tolerance: Patient tolerated treatment well General Behavior During Therapy: [facility] for tasks assessed/performed      Velora Mediate, OTR/L  12/31/2012, 10:50 AM

## 2013-01-02 ENCOUNTER — Ambulatory Visit (HOSPITAL_COMMUNITY)
Admission: RE | Admit: 2013-01-02 | Discharge: 2013-01-02 | Disposition: A | Discharge status: Home / Self Care | Payer: Medicare Other | Source: Ambulatory Visit | Attending: Internal Medicine | Admitting: Internal Medicine

## 2013-01-03 NOTE — Progress Notes (Signed)
Occupational Therapy Treatment Patient Details  Name: Thomas Black. MRN: [id number] Date of Birth: 1941-02-14  Today's Date: 01/02/2013 Time: 1020-1105 OT Time Calculation (min): 45 min Manual 1020-1035 (15') TherExercises 1035-1105 (1105   Visit#: 4 of 12  Re-eval: 01/30/13    Authorization: [employer]  Authorization Time Period: Before 10th visit  Authorization Visit#: 4 of 10  Subjective Symptoms/Limitations Symptoms: S:  I tried some more wall pushups yesterday and i did quite well .  I dont understand why i am having so much trouble brushing my hair.  Pain Assessment Currently in Pain?: Yes Pain Score: 2  Pain Location: Shoulder Pain Orientation: Right Pain Type: Acute pain Multiple Pain Sites: No     Exercise/Treatments Supine Protraction: AAROM;10 reps;Strengthening;12 reps Protraction Weight (lbs): 2 Horizontal ABduction: AAROM;10 reps;Strengthening;12 reps;Weights Horizontal ABduction Weight (lbs): 2 External Rotation: AAROM;10 reps;Strengthening;12 reps;Weights External Rotation Weight (lbs): 2 Internal Rotation: AAROM;10 reps;Strengthening;12 reps;Weights Internal Rotation Weight (lbs): 2 Flexion: AAROM;10 reps;Strengthening;12 reps;Weights Shoulder Flexion Weight (lbs): 2 ABduction: AAROM;10 reps;Strengthening;12 reps;Weights Shoulder ABduction Weight (lbs): 2 Seated Retraction: AROM;12 reps;Weights Retraction Weight (lbs): 2 Row: AROM;12 reps;Weights Row Weight (lbs): 2 Horizontal ABduction: AROM;12 reps;Weights Horizontal ABduction Weight (lbs): 2 Flexion: AROM;12 reps;Weights Flexion Weight (lbs): 2 Abduction: AROM;12 reps;Weights ABduction Weight (lbs): 2 Standing External Rotation: AROM;Strengthening;10 reps;Theraband Theraband Level (Shoulder External Rotation): Level 2 (Red) Extension: AROM;Strengthening;10 reps;Theraband Theraband Level (Shoulder Extension): Level 2 (Red) Row: AROM;Strengthening;10 reps;Theraband Theraband Level  (Shoulder Row): Level 2 (Red) ROM / Strengthening / Isometric Strengthening UBE (Upper Arm Bike): 2' and 2' (level 3 ) Cybex Press: 2.5 plate;10 reps Cybex Row: 2.5 plate;10 reps "W" Arms: x 10 with blue band          Occupational Therapy Assessment and Plan OT Assessment and Plan Clinical Impression Statement: A: Patient continue to report increasing overall strength and functional use of RUE in daily tasks and gym activities.  Patient with excellent form on cybex for exercises. tolerated theraband exercises with no  complaint of discomfort.   OT Plan: P: Progress as tolerated AROM, isometircs, seated range, strengthening, reaching activities.     Goals Short Term Goals Short Term Goal 1: Patient will be educated on HEP Short Term Goal 1 Progress: Progressing toward goal Short Term Goal 2: Patient will have 4-/5 strength in RUE for increased ability to participate in daily activities Short Term Goal 2 Progress: Progressing toward goal Short Term Goal 3: Patiet will report decreasd complaint of pain to 2/10during daily tasks/activiteis Short Term Goal 3 Progress: Progressing toward goal Short Term Goal 4: Patient will improve AROM in supine to [facility] to increase functional use in daily tasks.   Short Term Goal 4 Progress: Progressing toward goal Short Term Goal 5: Patient will have min/mod fascial restrictions in his RUE Short Term Goal 5 Progress: Progressing toward goal Long Term Goals Long Term Goal 1: Patient will return to highest level of independence with all leisure and daily activities Long Term Goal 1 Progress: Progressing toward goal Long Term Goal 2: Patient will decrease pain in right shoulder to  </= 1/10 when completed daily ADL/leisure tasks.  Long Term Goal 2 Progress: Progressing toward goal Long Term Goal 3: Patientwill have 4+/5 sterngth in hir RUE for increased ability to participate in daily activities and leisure hobbies.  Long Term Goal 3 Progress: Progressing  toward goal Long Term Goal 4: Patient will have trace/min fascial restrictions in RUE  Long Term Goal 4 Progress: Progressing toward goal Long Term Goal  5: Patient will increased AROM to WNL in right shoulder for increased independence for long arm activities, reaching overhead and into cabinets.   Long Term Goal 5 Progress: Progressing toward goal  Problem List Patient Active Problem List   Diagnosis Date Noted  . s/p Shoulder arthroscopy 12/19/2012  . Pain in joint, shoulder region 12/19/2012  . Muscle weakness (generalized) 12/19/2012    End of Session Activity Tolerance: Patient tolerated treatment well General Behavior During Therapy: [facility] for tasks assessed/performed   Velora Mediate, OTR/L  01/02/2013, 11:58 AM

## 2013-01-07 ENCOUNTER — Ambulatory Visit (HOSPITAL_COMMUNITY)
Admission: RE | Admit: 2013-01-07 | Discharge: 2013-01-07 | Disposition: A | Discharge status: Home / Self Care | Payer: Medicare Other | Source: Ambulatory Visit | Attending: Internal Medicine | Admitting: Internal Medicine

## 2013-01-07 ENCOUNTER — Other Ambulatory Visit: Payer: Self-pay | Admitting: Dermatology

## 2013-01-07 NOTE — Evaluation (Signed)
Occupational Therapy Evaluation  Patient Details  Name: Thomas Black. MRN: [id number] Date of Birth: 20-Aug-1940  Today's Date: 01/07/2013 Time: 1191-4782 OT Time Calculation (min): 38 min Manual Therapy 934-957 23' ROM/MMT [id number] 15' Visit#: 5 of 12  Re-eval: 01/07/13  Assessment Diagnosis: s/p Right Shoulder Surgery/Scope 11/21/2012  Authorization: [employer]  Authorization Time Period: Before 10th visit  Authorization Visit#: 5 of 10   Past Medical History:  Past Medical History  Diagnosis Date  . Hypertension   . Hypercholesteremia   . Headache(784.0)   . Chronic neck pain   . Muscle spasms of neck   . Migraine    Past Surgical History:  Past Surgical History  Procedure Laterality Date  . Fractured nose    . Fractured jaw    . Cholecystectomy      1/12  . Left acl repair    . Left knee arthroscopy    . Colonoscopy  03/15/2011    Procedure: COLONOSCOPY;  Surgeon: [name], MD;  [facility];  Service: Endoscopy;  Laterality: N/A;  9:30 am  . Tibia fracture surgery    . Esophagogastroduodenoscopy N/A 06/26/2012    Procedure: ESOPHAGOGASTRODUODENOSCOPY (EGD);  Surgeon: [name], MD;  Location: [facility];  Service: Endoscopy;  Laterality: N/A;    Subjective  S:  The only thing that is more difficult for me is reaching into the fridge and grabbing a milk jug.   Special Tests: FOTO was 63 and is currently 64 Pain Assessment Currently in Pain?: Yes Pain Score: 1  Pain Location: Shoulder Pain Orientation: Right   Additional Assessments RUE AROM (degrees) RUE Overall AROM Comments: assessed in seated (initial eval)  ER/IR with shoulder adducted  Right Shoulder Flexion: 165 Degrees (136) Right Shoulder ABduction: 153 Degrees (125) Right Shoulder Internal Rotation: 85 Degrees (80) Right Shoulder External Rotation: 68 Degrees (68) RUE Strength Right Shoulder Flexion: 5/5 (3/5) Right Shoulder Extension: 5/5 (3/5) Right Shoulder  ABduction: 5/5 (3/5) Right Shoulder Internal Rotation: 5/5 (3/5) Right Shoulder External Rotation: 5/5 (3/5)     Exercise/Treatments    Manual Therapy Manual Therapy: Myofascial release Myofascial Release: MFR to right upper arm, trapezius and scapularis region to decrease fascial restrictions and increase joint mobility in a pain free zone  Occupational Therapy Assessment and Plan OT Assessment and Plan Clinical Impression Statement: A:  Patient reassessed for MD visit, patient has WFL AROM and strength, has met all OT goals and is pleased with his current functional status.  He has returned to the gym, IADLs, and all leisure activities.  DC from skilled OT services this date.  OT Plan: P:  DC this date.   Goals Short Term Goals Short Term Goal 1: Patient will be educated on HEP Short Term Goal 1 Progress: Met Short Term Goal 2: Patient will have 4-/5 strength in RUE for increased ability to participate in daily activities Short Term Goal 2 Progress: Met Short Term Goal 3: Patiet will report decreasd complaint of pain to 2/10during daily tasks/activiteis Short Term Goal 3 Progress: Met Short Term Goal 4: Patient will improve AROM in supine to [facility] to increase functional use in daily tasks.   Short Term Goal 4 Progress: Met Short Term Goal 5: Patient will have min/mod fascial restrictions in his RUE Short Term Goal 5 Progress: Met Long Term Goals Long Term Goal 1: Patient will return to highest level of independence with all leisure and daily activities Long Term Goal 1 Progress: Met Long  Term Goal 2: Patient will decrease pain in right shoulder to  </= 1/10 when completed daily ADL/leisure tasks.  Long Term Goal 2 Progress: Met Long Term Goal 3: Patientwill have 4+/5 sterngth in hir RUE for increased ability to participate in daily activities and leisure hobbies.  Long Term Goal 4: Patient will have trace/min fascial restrictions in RUE  Long Term Goal 4 Progress: Met Long Term  Goal 5: Patient will increased AROM to WNL in right shoulder for increased independence for long arm activities, reaching overhead and into cabinets.   Long Term Goal 5 Progress: Met  Problem List Patient Active Problem List   Diagnosis Date Noted  . s/p Shoulder arthroscopy 12/19/2012  . Pain in joint, shoulder region 12/19/2012  . Muscle weakness (generalized) 12/19/2012    End of Session Activity Tolerance: Patient tolerated treatment well General Behavior During Therapy: [facility] for tasks assessed/performed OT Plan of Care OT Home Exercise Plan: Patient completing general UB strengthening at the gym and therapist added flex to 90 and hold for 10" and abd to 90 and hold for 10" Consulted and Agree with Plan of Care: Patient  GO Functional Assessment Tool Used: FOTO scored 64% independent, patient reports 90% independence Functional Limitation: Carrying, moving and handling objects Carrying, Moving and Handling Objects Goal Status (J1914): At least 1 percent but less than 20 percent impaired, limited or restricted Carrying, Moving and Handling Objects Discharge Status [phone]): At least 1 percent but less than 20 percent impaired, limited or restricted  Shirlean Mylar, OTR/L  01/07/2013, 10:19 AM  Physician Documentation Your signature is required to indicate approval of the treatment plan as stated above.  Please sign and either send electronically or make a copy of this report for your files and return this physician signed original.  Please mark one 1.__approve of plan  2. ___approve of plan with the following conditions.   ______________________________                                                          _____________________ Physician Signature                                                                                                             Date

## 2013-01-09 ENCOUNTER — Ambulatory Visit (HOSPITAL_COMMUNITY): Payer: Medicare Other | Admitting: Specialist

## 2013-01-14 ENCOUNTER — Ambulatory Visit (HOSPITAL_COMMUNITY): Payer: Medicare Other | Admitting: Occupational Therapy

## 2013-01-16 ENCOUNTER — Ambulatory Visit (HOSPITAL_COMMUNITY): Payer: Medicare Other | Admitting: Occupational Therapy

## 2014-03-25 ENCOUNTER — Other Ambulatory Visit: Payer: Self-pay | Admitting: Dermatology

## 2017-01-25 ENCOUNTER — Other Ambulatory Visit (HOSPITAL_COMMUNITY): Payer: Self-pay | Admitting: Internal Medicine

## 2017-01-25 ENCOUNTER — Ambulatory Visit (HOSPITAL_COMMUNITY)
Admission: RE | Admit: 2017-01-25 | Discharge: 2017-01-25 | Disposition: A | Discharge status: Home / Self Care | Payer: Medicare Other | Source: Ambulatory Visit | Attending: Internal Medicine | Admitting: Internal Medicine

## 2017-01-25 DIAGNOSIS — J189 Pneumonia, unspecified organism: Secondary | ICD-10-CM | POA: Insufficient documentation

## 2017-01-25 DIAGNOSIS — R0602 Shortness of breath: Secondary | ICD-10-CM

## 2017-02-27 DIAGNOSIS — I219 Acute myocardial infarction, unspecified: Secondary | ICD-10-CM

## 2017-02-27 HISTORY — DX: Acute myocardial infarction, unspecified: I21.9

## 2017-12-03 ENCOUNTER — Emergency Department (HOSPITAL_COMMUNITY): Payer: Medicare Other

## 2017-12-03 ENCOUNTER — Inpatient Hospital Stay (HOSPITAL_COMMUNITY)
Admission: EM | Admit: 2017-12-03 | Discharge: 2017-12-06 | DRG: 246 | Disposition: A | Discharge status: Home / Self Care | Payer: Medicare Other | Attending: Cardiology | Admitting: Cardiology

## 2017-12-03 ENCOUNTER — Inpatient Hospital Stay (HOSPITAL_COMMUNITY)
Admission: EM | Disposition: A | Discharge status: Home / Self Care | Payer: Self-pay | Source: Home / Self Care | Attending: Cardiovascular Disease

## 2017-12-03 ENCOUNTER — Other Ambulatory Visit: Payer: Self-pay

## 2017-12-03 ENCOUNTER — Encounter (HOSPITAL_COMMUNITY): Payer: Self-pay | Admitting: Emergency Medicine

## 2017-12-03 DIAGNOSIS — I213 ST elevation (STEMI) myocardial infarction of unspecified site: Secondary | ICD-10-CM | POA: Diagnosis present

## 2017-12-03 DIAGNOSIS — I2510 Atherosclerotic heart disease of native coronary artery without angina pectoris: Secondary | ICD-10-CM

## 2017-12-03 DIAGNOSIS — I255 Ischemic cardiomyopathy: Secondary | ICD-10-CM | POA: Diagnosis present

## 2017-12-03 DIAGNOSIS — I2109 ST elevation (STEMI) myocardial infarction involving other coronary artery of anterior wall: Principal | ICD-10-CM | POA: Diagnosis present

## 2017-12-03 DIAGNOSIS — G8929 Other chronic pain: Secondary | ICD-10-CM | POA: Diagnosis present

## 2017-12-03 DIAGNOSIS — Z79899 Other long term (current) drug therapy: Secondary | ICD-10-CM

## 2017-12-03 DIAGNOSIS — I130 Hypertensive heart and chronic kidney disease with heart failure and stage 1 through stage 4 chronic kidney disease, or unspecified chronic kidney disease: Secondary | ICD-10-CM | POA: Diagnosis present

## 2017-12-03 DIAGNOSIS — I2102 ST elevation (STEMI) myocardial infarction involving left anterior descending coronary artery: Secondary | ICD-10-CM

## 2017-12-03 DIAGNOSIS — N182 Chronic kidney disease, stage 2 (mild): Secondary | ICD-10-CM | POA: Diagnosis present

## 2017-12-03 DIAGNOSIS — E785 Hyperlipidemia, unspecified: Secondary | ICD-10-CM | POA: Diagnosis present

## 2017-12-03 DIAGNOSIS — Z23 Encounter for immunization: Secondary | ICD-10-CM

## 2017-12-03 DIAGNOSIS — I5021 Acute systolic (congestive) heart failure: Secondary | ICD-10-CM | POA: Diagnosis present

## 2017-12-03 DIAGNOSIS — G43909 Migraine, unspecified, not intractable, without status migrainosus: Secondary | ICD-10-CM | POA: Diagnosis present

## 2017-12-03 DIAGNOSIS — M542 Cervicalgia: Secondary | ICD-10-CM | POA: Diagnosis present

## 2017-12-03 DIAGNOSIS — Z955 Presence of coronary angioplasty implant and graft: Secondary | ICD-10-CM

## 2017-12-03 DIAGNOSIS — R079 Chest pain, unspecified: Secondary | ICD-10-CM | POA: Diagnosis present

## 2017-12-03 HISTORY — PX: LEFT HEART CATH AND CORONARY ANGIOGRAPHY: CATH118249

## 2017-12-03 HISTORY — DX: Atherosclerotic heart disease of native coronary artery without angina pectoris: I25.10

## 2017-12-03 HISTORY — PX: CORONARY STENT INTERVENTION: CATH118234

## 2017-12-03 HISTORY — DX: Acute myocardial infarction, unspecified: I21.9

## 2017-12-03 LAB — CBC WITH DIFFERENTIAL/PLATELET
Basophils Absolute: 0.1 10*3/uL (ref 0.0–0.1)
Basophils Relative: 0 %
EOS ABS: 0 10*3/uL (ref 0.0–0.7)
Eosinophils Relative: 0 %
HEMATOCRIT: 48.4 % (ref 39.0–52.0)
Hemoglobin: 16.7 g/dL (ref 13.0–17.0)
LYMPHS PCT: 12 %
Lymphs Abs: 1.4 10*3/uL (ref 0.7–4.0)
MCH: 33 pg (ref 26.0–34.0)
MCHC: 34.5 g/dL (ref 30.0–36.0)
MCV: 95.7 fL (ref 78.0–100.0)
Monocytes Absolute: 0.6 10*3/uL (ref 0.1–1.0)
Monocytes Relative: 5 %
NEUTROS ABS: 9.7 10*3/uL — AB (ref 1.7–7.7)
NEUTROS PCT: 82 %
PLATELETS: 267 10*3/uL (ref 150–400)
RBC: 5.06 MIL/uL (ref 4.22–5.81)
RDW: 13 % (ref 11.5–15.5)
WBC: 11.8 10*3/uL — AB (ref 4.0–10.5)

## 2017-12-03 LAB — COMPREHENSIVE METABOLIC PANEL
ALBUMIN: 5.2 g/dL — AB (ref 3.5–5.0)
ALK PHOS: 60 U/L (ref 38–126)
ALT: 25 U/L (ref 0–44)
AST: 38 U/L (ref 15–41)
Anion gap: 9 (ref 5–15)
BUN: 14 mg/dL (ref 8–23)
CALCIUM: 9.9 mg/dL (ref 8.9–10.3)
CHLORIDE: 100 mmol/L (ref 98–111)
CO2: 26 mmol/L (ref 22–32)
CREATININE: 1.06 mg/dL (ref 0.61–1.24)
GFR calc Af Amer: >60 mL/min (ref >60)
GFR calc non Af Amer: >60 mL/min (ref >60)
GLUCOSE: 128 mg/dL — AB (ref 70–99)
Potassium: 4.2 mmol/L (ref 3.5–5.1)
SODIUM: 135 mmol/L (ref 135–145)
Total Bilirubin: 0.8 mg/dL (ref 0.3–1.2)
Total Protein: 9 g/dL — ABNORMAL HIGH (ref 6.5–8.1)

## 2017-12-03 LAB — LIPID PANEL
Cholesterol: 241 mg/dL — ABNORMAL HIGH (ref 0–200)
HDL: 60 mg/dL (ref >40)
LDL Cholesterol: 127 mg/dL — ABNORMAL HIGH (ref 0–99)
Total CHOL/HDL Ratio: 4 RATIO
Triglycerides: 268 mg/dL — ABNORMAL HIGH (ref <150)
VLDL: 54 mg/dL — ABNORMAL HIGH (ref 0–40)

## 2017-12-03 LAB — PROTIME-INR
INR: 1.04
Prothrombin Time: 13.5 seconds (ref 11.4–15.2)

## 2017-12-03 LAB — TROPONIN I
Troponin I: 0.8 ng/mL (ref <0.03)
Troponin I: 62.19 ng/mL (ref <0.03)
Troponin I: >65 ng/mL (ref <0.03)

## 2017-12-03 LAB — I-STAT TROPONIN, ED: Troponin i, poc: 0.39 ng/mL (ref 0.00–0.08)

## 2017-12-03 LAB — MRSA PCR SCREENING: MRSA BY PCR: NEGATIVE

## 2017-12-03 LAB — POCT ACTIVATED CLOTTING TIME
ACTIVATED CLOTTING TIME: 142 s
ACTIVATED CLOTTING TIME: 433 s

## 2017-12-03 LAB — PLATELET COUNT: Platelets: 281 10*3/uL (ref 150–400)

## 2017-12-03 LAB — APTT: aPTT: 27 seconds (ref 24–36)

## 2017-12-03 SURGERY — LEFT HEART CATH AND CORONARY ANGIOGRAPHY
Anesthesia: LOCAL

## 2017-12-03 MED ORDER — IOHEXOL 350 MG/ML SOLN
INTRAVENOUS | Status: DC | PRN
  Administered 2017-12-03: 125 mL via INTRAVENOUS

## 2017-12-03 MED ORDER — HYDRALAZINE HCL 20 MG/ML IJ SOLN
5.0000 mg | INTRAMUSCULAR | Status: AC | PRN
  Administered 2017-12-03: 5 mg via INTRAVENOUS
  Filled 2017-12-03: qty 1

## 2017-12-03 MED ORDER — ASPIRIN 81 MG PO CHEW
CHEWABLE_TABLET | ORAL | Status: AC
  Filled 2017-12-03: qty 4

## 2017-12-03 MED ORDER — ACETAMINOPHEN 325 MG PO TABS
650.0000 mg | ORAL_TABLET | ORAL | Status: DC | PRN
  Administered 2017-12-04 (×2): 650 mg via ORAL
  Filled 2017-12-03 (×2): qty 2

## 2017-12-03 MED ORDER — ONDANSETRON HCL 4 MG/2ML IJ SOLN
4.0000 mg | Freq: Four times a day (QID) | INTRAMUSCULAR | Status: DC | PRN
  Administered 2017-12-03: 4 mg via INTRAVENOUS
  Filled 2017-12-03: qty 2

## 2017-12-03 MED ORDER — MORPHINE SULFATE (PF) 2 MG/ML IV SOLN: 2.0000 mg | INTRAVENOUS | Status: DC | PRN

## 2017-12-03 MED ORDER — MIDAZOLAM HCL 2 MG/2ML IJ SOLN
INTRAMUSCULAR | Status: DC | PRN
  Administered 2017-12-03: 1 mg via INTRAVENOUS

## 2017-12-03 MED ORDER — HEPARIN (PORCINE) IN NACL 1000-0.9 UT/500ML-% IV SOLN
INTRAVENOUS | Status: AC
  Filled 2017-12-03: qty 1000

## 2017-12-03 MED ORDER — NITROGLYCERIN 1 MG/10 ML FOR IR/CATH LAB
INTRA_ARTERIAL | Status: DC | PRN
  Administered 2017-12-03: 150 ug

## 2017-12-03 MED ORDER — NITROGLYCERIN IN D5W 200-5 MCG/ML-% IV SOLN
5.0000 ug/min | Freq: Once | INTRAVENOUS | Status: AC
  Administered 2017-12-03: 5 ug/min via INTRAVENOUS
  Filled 2017-12-03: qty 250

## 2017-12-03 MED ORDER — LABETALOL HCL 5 MG/ML IV SOLN: 10.0000 mg | INTRAVENOUS | Status: AC | PRN

## 2017-12-03 MED ORDER — TICAGRELOR 90 MG PO TABS
ORAL_TABLET | ORAL | Status: AC
  Filled 2017-12-03: qty 2

## 2017-12-03 MED ORDER — SODIUM CHLORIDE 0.9 % IV SOLN
INTRAVENOUS | Status: DC
  Administered 2017-12-03: 20 mL/h via INTRAVENOUS

## 2017-12-03 MED ORDER — HEPARIN SODIUM (PORCINE) 1000 UNIT/ML IJ SOLN
INTRAMUSCULAR | Status: DC | PRN
  Administered 2017-12-03 (×2): 5000 [IU] via INTRAVENOUS

## 2017-12-03 MED ORDER — ASPIRIN 81 MG PO CHEW: 81.0000 mg | CHEWABLE_TABLET | Freq: Every day | ORAL | Status: DC

## 2017-12-03 MED ORDER — MIDAZOLAM HCL 2 MG/2ML IJ SOLN
INTRAMUSCULAR | Status: AC
  Filled 2017-12-03: qty 2

## 2017-12-03 MED ORDER — LIDOCAINE HCL (PF) 1 % IJ SOLN
INTRAMUSCULAR | Status: AC
  Filled 2017-12-03: qty 30

## 2017-12-03 MED ORDER — ATORVASTATIN CALCIUM 80 MG PO TABS
80.0000 mg | ORAL_TABLET | Freq: Every day | ORAL | Status: DC
  Administered 2017-12-03 – 2017-12-05 (×3): 80 mg via ORAL
  Filled 2017-12-03 (×3): qty 1

## 2017-12-03 MED ORDER — ASPIRIN 81 MG PO CHEW
324.0000 mg | CHEWABLE_TABLET | Freq: Once | ORAL | Status: AC
  Administered 2017-12-03: 324 mg via ORAL

## 2017-12-03 MED ORDER — SODIUM CHLORIDE 0.9 % IV SOLN: 250.0000 mL | INTRAVENOUS | Status: DC | PRN

## 2017-12-03 MED ORDER — INFLUENZA VAC SPLIT HIGH-DOSE 0.5 ML IM SUSY
0.5000 mL | PREFILLED_SYRINGE | INTRAMUSCULAR | Status: AC | PRN
  Administered 2017-12-06: 0.5 mL via INTRAMUSCULAR
  Filled 2017-12-03: qty 0.5

## 2017-12-03 MED ORDER — SODIUM CHLORIDE 0.9% FLUSH: 3.0000 mL | INTRAVENOUS | Status: DC | PRN

## 2017-12-03 MED ORDER — FAMOTIDINE 20 MG PO TABS
20.0000 mg | ORAL_TABLET | Freq: Every day | ORAL | Status: DC
  Administered 2017-12-03 – 2017-12-05 (×3): 20 mg via ORAL
  Filled 2017-12-03 (×3): qty 1

## 2017-12-03 MED ORDER — FUROSEMIDE 10 MG/ML IJ SOLN
INTRAMUSCULAR | Status: AC
  Filled 2017-12-03: qty 4

## 2017-12-03 MED ORDER — ONDANSETRON HCL 4 MG/2ML IJ SOLN: 4.0000 mg | Freq: Four times a day (QID) | INTRAMUSCULAR | Status: DC | PRN

## 2017-12-03 MED ORDER — TIROFIBAN HCL IN NACL 5-0.9 MG/100ML-% IV SOLN
INTRAVENOUS | Status: AC | PRN
  Administered 2017-12-03: 0.15 ug/kg/min via INTRAVENOUS

## 2017-12-03 MED ORDER — ATORVASTATIN CALCIUM 80 MG PO TABS: 80.0000 mg | ORAL_TABLET | Freq: Every day | ORAL | Status: DC

## 2017-12-03 MED ORDER — TIROFIBAN (AGGRASTAT) BOLUS VIA INFUSION
INTRAVENOUS | Status: DC | PRN
  Administered 2017-12-03: 2280 ug via INTRAVENOUS

## 2017-12-03 MED ORDER — SODIUM CHLORIDE 0.9% FLUSH
3.0000 mL | Freq: Two times a day (BID) | INTRAVENOUS | Status: DC
  Administered 2017-12-03 – 2017-12-06 (×5): 3 mL via INTRAVENOUS

## 2017-12-03 MED ORDER — TICAGRELOR 90 MG PO TABS
90.0000 mg | ORAL_TABLET | Freq: Two times a day (BID) | ORAL | Status: DC
  Administered 2017-12-03 – 2017-12-06 (×6): 90 mg via ORAL
  Filled 2017-12-03 (×6): qty 1

## 2017-12-03 MED ORDER — VERAPAMIL HCL 2.5 MG/ML IV SOLN
INTRAVENOUS | Status: AC
  Filled 2017-12-03: qty 2

## 2017-12-03 MED ORDER — FENTANYL CITRATE (PF) 100 MCG/2ML IJ SOLN
INTRAMUSCULAR | Status: DC | PRN
  Administered 2017-12-03: 25 ug via INTRAVENOUS

## 2017-12-03 MED ORDER — TICAGRELOR 90 MG PO TABS
ORAL_TABLET | ORAL | Status: DC | PRN
  Administered 2017-12-03: 180 mg via ORAL

## 2017-12-03 MED ORDER — HEPARIN SODIUM (PORCINE) 5000 UNIT/ML IJ SOLN
4000.0000 [IU] | Freq: Once | INTRAMUSCULAR | Status: AC
  Administered 2017-12-03: 4000 [IU] via INTRAVENOUS

## 2017-12-03 MED ORDER — OXYCODONE HCL 5 MG PO TABS
5.0000 mg | ORAL_TABLET | ORAL | Status: DC | PRN
  Administered 2017-12-04: 10 mg via ORAL
  Filled 2017-12-03: qty 2

## 2017-12-03 MED ORDER — FENTANYL CITRATE (PF) 100 MCG/2ML IJ SOLN
INTRAMUSCULAR | Status: AC
  Filled 2017-12-03: qty 2

## 2017-12-03 MED ORDER — HEPARIN SODIUM (PORCINE) 5000 UNIT/ML IJ SOLN
INTRAMUSCULAR | Status: AC
  Filled 2017-12-03: qty 1

## 2017-12-03 MED ORDER — ACETAMINOPHEN 325 MG PO TABS: 650.0000 mg | ORAL_TABLET | ORAL | Status: DC | PRN

## 2017-12-03 MED ORDER — HEPARIN (PORCINE) IN NACL 1000-0.9 UT/500ML-% IV SOLN
INTRAVENOUS | Status: DC | PRN
  Administered 2017-12-03 (×2): 500 mL

## 2017-12-03 MED ORDER — ASPIRIN EC 81 MG PO TBEC
81.0000 mg | DELAYED_RELEASE_TABLET | Freq: Every day | ORAL | Status: DC
  Administered 2017-12-04 – 2017-12-06 (×3): 81 mg via ORAL
  Filled 2017-12-03 (×3): qty 1

## 2017-12-03 SURGICAL SUPPLY — 17 items
BALLN SAPPHIRE 2.5X15 (BALLOONS) ×2
BALLN SAPPHIRE ~~LOC~~ 3.5X8 (BALLOONS) ×1 IMPLANT
BALLOON SAPPHIRE 2.5X15 (BALLOONS) IMPLANT
CATH 5FR JL3.5 JR4 ANG PIG MP (CATHETERS) ×1 IMPLANT
CATH LAUNCHER 6FR EBU3.5 (CATHETERS) ×1 IMPLANT
DEVICE RAD COMP TR BAND LRG (VASCULAR PRODUCTS) ×1 IMPLANT
GLIDESHEATH SLEND SS 6F .021 (SHEATH) ×1 IMPLANT
GUIDEWIRE INQWIRE 1.5J.035X260 (WIRE) IMPLANT
INQWIRE 1.5J .035X260CM (WIRE) ×2
KIT ENCORE 26 ADVANTAGE (KITS) ×2 IMPLANT
KIT HEART LEFT (KITS) ×2 IMPLANT
PACK CARDIAC CATHETERIZATION (CUSTOM PROCEDURE TRAY) ×2 IMPLANT
STENT RESOLUTE ONYX 3.0X15 (Permanent Stent) ×1 IMPLANT
SYR MEDRAD MARK V 150ML (SYRINGE) ×2 IMPLANT
TRANSDUCER W/STOPCOCK (MISCELLANEOUS) ×2 IMPLANT
TUBING CIL FLEX 10 FLL-RA (TUBING) ×2 IMPLANT
WIRE COUGAR XT STRL 300CM (WIRE) ×1 IMPLANT

## 2017-12-03 NOTE — Progress Notes (Signed)
   12/03/17 1700  Clinical Encounter Type  Visited With Family;Health care provider  Visit Type Social support;Code;Other (Comment) (STEMI)  Referral From  (STEMI page)  Stress Factors  Family Stress Factors Major life changes   ED called when spouse arrived.  Walked her up to [facility] waiting rm, let Cath lab staff know she was there, was present when Dr updated her.  Supportive listening and compassionate presence.    Margretta Sidle resident, [phone]

## 2017-12-03 NOTE — Progress Notes (Signed)
   12/03/17 1600  Clinical Encounter Type  Visited With Health care provider  Visit Type Initial;Code;Other (Comment) (STEMI)  Referral From Other (Comment)  Consult/Referral To  (CODE STEMI)   Per EMS, pt's wife has been notified and is en route.  Chaplain let ED pod B and ED waiting room front desk know this and to call chaplain to walk her up to Cath Lab if desired.    Margretta Sidle resident, [phone]

## 2017-12-03 NOTE — Progress Notes (Signed)
CRITICAL VALUE ALERT  Critical Value:  Troponin 62.19  Date & Time Notied:  12/03/2017 2633  Provider Notified: Deforest Hoyles (paged)   Orders Received/Actions taken: awaiting reply from page. Will continue to assess and monitor.

## 2017-12-03 NOTE — ED Notes (Signed)
Called [facility] for transfer of Stemi to [facility]

## 2017-12-03 NOTE — ED Triage Notes (Signed)
Pt reports he began having chest pain yesterday morning while at the [facility].  Has been progressively more sob with bilateral arm pain.  Pt did not have aspirin today.

## 2017-12-03 NOTE — ED Provider Notes (Signed)
[facility] EMERGENCY DEPARTMENT Provider Note   CSN: [id number] Arrival date & time: 12/03/17  1438     History   Chief Complaint Chief Complaint  Patient presents with  . Chest Pain    HPI Thomas Black. is a 77 y.o. male.   Chest Pain      Patient is a 77 year old male, he has a history of chronic neck pain, some cholesterol issues but takes no medication for diabetes high blood pressure, has never smoked, has never had any chest discomfort and exercises regularly at the gym.  His wife is here and is an additional historian who corroborates the patient's story, the patient states that yesterday morning he developed chest pain with radiation to the bilateral shoulders.  He has had associated shortness of breath, diaphoresis, nausea and when he went to the gym this morning he could not walk on the treadmill whereas he usually walks 3 or 4 miles per day.  He continues to have symptoms which are persistent,  Past Medical History:  Diagnosis Date  . Chronic neck pain   . Headache(784.0)   . Hypercholesteremia   . Hypertension   . Migraine   . Muscle spasms of neck     Patient Active Problem List   Diagnosis Date Noted  . s/p Shoulder arthroscopy 12/19/2012  . Pain in joint, shoulder region 12/19/2012  . Muscle weakness (generalized) 12/19/2012    Past Surgical History:  Procedure Laterality Date  . CHOLECYSTECTOMY     1/12  . COLONOSCOPY  03/15/2011   Procedure: COLONOSCOPY;  Surgeon: [name], MD;  [facility];  Service: Endoscopy;  Laterality: N/A;  9:30 am  . ESOPHAGOGASTRODUODENOSCOPY N/A 06/26/2012   Procedure: ESOPHAGOGASTRODUODENOSCOPY (EGD);  Surgeon: [name], MD;  Location: [facility];  Service: Endoscopy;  Laterality: N/A;  . fractured jaw    . fractured nose    . left acl repair    . Left Knee Arthroscopy    . TIBIA FRACTURE SURGERY          Home Medications    Prior to Admission medications   Medication Sig Start  Date End Date Taking? Authorizing Provider  atenolol (TENORMIN) 25 MG tablet Take 25 mg by mouth at bedtime.     [provider]  cyclobenzaprine (FLEXERIL) 10 MG tablet Take 10 mg by mouth at bedtime. For muscle spasms    [provider]  docusate sodium (COLACE) 100 MG capsule Take 100 mg by mouth at bedtime.    [provider]  oxyCODONE-acetaminophen (PERCOCET) 10-325 MG per tablet Take 0.5-1 tablets by mouth daily as needed for pain (for migraine pain). migraines    [provider]  simvastatin (ZOCOR) 40 MG tablet Take 40 mg by mouth at bedtime.     [provider]    Family History Family History  Problem Relation Age of Onset  . Colon cancer Neg Hx     Social History Social History   Tobacco Use  . Smoking status: Never Smoker  Substance Use Topics  . Alcohol use: No  . Drug use: No     Allergies   Patient has no known allergies.   Review of Systems Review of Systems  Cardiovascular: Positive for chest pain.  All other systems reviewed and are negative.    Physical Exam Updated Vital Signs Pulse 87   Temp (!) 97.4 F (36.3 C) (Oral)   Resp 18   Ht 1.829 m (6')  Wt 91.2 kg   SpO2 99%   BMI 27.26 kg/m   Physical Exam  Constitutional: He appears well-developed and well-nourished. He appears ill. No distress.  HENT:  Head: Normocephalic and atraumatic.  Mouth/Throat: Oropharynx is clear and moist. No oropharyngeal exudate.  Eyes: Pupils are equal, round, and reactive to light. Conjunctivae and EOM are normal. Right eye exhibits no discharge. Left eye exhibits no discharge. No scleral icterus.  Neck: Normal range of motion. Neck supple. No JVD present. No thyromegaly present.  Cardiovascular: Normal rate, regular rhythm, normal heart sounds and intact distal pulses. Exam reveals no gallop and no friction rub.  No murmur heard. Pulmonary/Chest: Effort normal and breath sounds normal. No respiratory distress. He  has no wheezes. He has no rales.  Abdominal: Soft. Bowel sounds are normal. He exhibits no distension and no mass. There is no tenderness.  Musculoskeletal: Normal range of motion. He exhibits no edema or tenderness.  Lymphadenopathy:    He has no cervical adenopathy.  Neurological: He is alert. Coordination normal.  Skin: Skin is warm. No rash noted. He is diaphoretic. No erythema.  Psychiatric: He has a normal mood and affect. His behavior is normal.  Nursing note and vitals reviewed.     ED Treatments / Results  Labs (all labs ordered are listed, but only abnormal results are displayed) Labs Reviewed  CBC WITH DIFFERENTIAL/PLATELET  PROTIME-INR  APTT  COMPREHENSIVE METABOLIC PANEL  TROPONIN I  LIPID PANEL    EKG EKG Interpretation  Date/Time:  Monday December 03 2017 14:48:40 EDT Ventricular Rate:  85 PR Interval:    QRS Duration: 103 QT Interval:  363 QTC Calculation: 432 R Axis:   -45 Text Interpretation:  Sinus rhythm LAD, consider left anterior fascicular block Probable anterolateral infarct, acute >>> Acute MI <<< q waves present, acute STEMI Confirmed by Eber Hong ([id number]) on 12/03/2017 2:55:54 PM   Radiology No results found.  Procedures .Critical Care Performed by: Eber Hong, MD Authorized by: Eber Hong, MD   Critical care provider statement:    Critical care time (minutes):  35   Critical care time was exclusive of:  Separately billable procedures and treating other patients and teaching time   Critical care was necessary to treat or prevent imminent or life-threatening deterioration of the following conditions:  Cardiac failure   Critical care was time spent personally by me on the following activities:  Blood draw for specimens, development of treatment plan with patient or surrogate, discussions with consultants, evaluation of patient's response to treatment, examination of patient, obtaining history from patient or surrogate, ordering and  performing treatments and interventions, ordering and review of laboratory studies, ordering and review of radiographic studies, pulse oximetry, re-evaluation of patient's condition and review of old charts   (including critical care time)  Medications Ordered in ED Medications  0.9 %  sodium chloride infusion (has no administration in time range)  aspirin chewable tablet 324 mg (324 mg Oral Given 12/03/17 1456)  heparin injection 4,000 Units (4,000 Units Intravenous Given 12/03/17 1455)  nitroGLYCERIN 50 mg in dextrose 5 % 250 mL (0.2 mg/mL) infusion (5 mcg/min Intravenous New Bag/Given 12/03/17 1504)     Initial Impression / Assessment and Plan / ED Course  I have reviewed the triage vital signs and the nursing notes.  Pertinent labs & imaging results that were available during my care of the patient were reviewed by me and considered in my medical decision making (see chart for details).    I  discussed the case with Dr. Excell Seltzer of cardiology, the patient will be transferred emergently to [facility] for a percutaneous coronary intervention.  This is likely a proximal left anterior descending lesion.  He was given heparin, nitroglycerin, aspirin and IV fluids.  The patient is not hypotensive, he is ill-appearing, he will need acute intervention.  Critically ill.  STEMI activated, patient will be transferred immediately to the heart catheterization lab at the cardiac center  Final Clinical Impressions(s) / ED Diagnoses   Final diagnoses:  ST elevation myocardial infarction involving left anterior descending (LAD) coronary artery [facility])    ED Discharge Orders    None       Eber Hong, MD 12/03/17 1504

## 2017-12-03 NOTE — ED Notes (Signed)
Cath lab called and given report

## 2017-12-03 NOTE — Progress Notes (Signed)
Called to evaluate right arm radial access hematoma. TR band in place. Small hematoma 1 inch by 1 inch present proximal to TR Band. Also capillary refill slow at 3 seconds.  TR band repositioned To a more proximal position using a manual blood pressure cuff. TR band reinflated and adjusted to 10cc ( 13 originally) . Sp02 waveform present as measured in right hand. Right hand less mottled, capillary refill under 2 seconds.  Dr. Excell Seltzer will be notified.

## 2017-12-03 NOTE — ED Notes (Signed)
EMS has arrived to transport pt.

## 2017-12-03 NOTE — ED Notes (Signed)
CRITICAL VALUE ALERT  Critical Value: troponin 0.80  Date & Time Notied:  12/03/17 1553  Provider Notified: called to Cath lab  Orders Received/Actions taken:

## 2017-12-03 NOTE — H&P (Addendum)
Cardiology Admission History and Physical:   Patient ID: Thomas Black. MRN: [id number]; DOB: [birth year]   Admission date: 12/03/2017  Primary Care Provider: [name], MD Primary Cardiologist: New to [facility] Primary Electrophysiologist:  None   Chief Complaint:  Chest pain  Patient Profile:   Thomas Black. is a 77 y.o. male with a PMH of migraines, who presented to [facility] with chest pain.  History of Present Illness:   Thomas Black was in his usual state of health until the morning 12/02/17 when he began experiencing chest pain with radiation to bilateral shoulders while at church. He reported associated SOB, diaphoresis and nausea at that time. Symptoms continued to wax and wain thoughout the day becoming more persistent that evening. On the morning of 12/03/17, he attempted to walk his usual 3-4 miles on the treadmill at the [facility], however he was limited by SOB and pain in his right arm prompting him to present to [facility]. EKG on arrival revealed an anterolateral STEMI and patient was transferred to [facility] for urgent cardiac catheterization.   At the time of arrival to [facility], he continues to have right arm pain and 5/10 chest discomfort. Feels like he cannot breath. Denies recent illness, problems with his stomach, or bleeding. He reports being fairly healthy and denies history of HTN, HLD, or DM. He walks 3-4 miles on a treadmill 3-4 days per week and denies prior issues with chest pain or SOB.   ED course: BP elevated to 168/101, otherwise VSS. Labs with Trop 0.39>0.80, WBC 11.8, Hgb 16.7, PLT 267, electrolytes wnl, Cr 1.06, cholesterol 241, LDL 127, triglycerides 268. Patient was given ASA, started on heparin/nitro gtt, and transferred to [facility] for cardiac catheterization.  Past Medical History:  Diagnosis Date  . Chronic neck pain   . Headache(784.0)   . Hypercholesteremia   . Hypertension   . Migraine   . Muscle spasms of neck     Past Surgical  History:  Procedure Laterality Date  . CHOLECYSTECTOMY     1/12  . COLONOSCOPY  03/15/2011   Procedure: COLONOSCOPY;  Surgeon: [name], MD;  Location: [facility]O [facility];  Service: Endoscopy;  Laterality: N/A;  9:30 am  . ESOPHAGOGASTRODUODENOSCOPY N/A 06/26/2012   Procedure: ESOPHAGOGASTRODUODENOSCOPY (EGD);  Surgeon: [name], MD;  Location: [facility];  Service: Endoscopy;  Laterality: N/A;  . fractured jaw    . fractured nose    . left acl repair    . Left Knee Arthroscopy    . TIBIA FRACTURE SURGERY       Medications Prior to Admission: Prior to Admission medications   Medication Sig Start Date End Date Taking? Authorizing Provider  atenolol (TENORMIN) 25 MG tablet Take 25 mg by mouth at bedtime.     [provider]  cyclobenzaprine (FLEXERIL) 10 MG tablet Take 10 mg by mouth at bedtime. For muscle spasms    [provider]  docusate sodium (COLACE) 100 MG capsule Take 100 mg by mouth at bedtime.    [provider]  oxyCODONE-acetaminophen (PERCOCET) 10-325 MG per tablet Take 0.5-1 tablets by mouth daily as needed for pain (for migraine pain). migraines    [provider]  simvastatin (ZOCOR) 40 MG tablet Take 40 mg by mouth at bedtime.     [provider]     Allergies:   No Known Allergies  Social History:   Social History   Socioeconomic History  . Marital  status: Married    Spouse name: Not on file  . Number of children: Not on file  . Years of education: Not on file  . Highest education level: Not on file  Occupational History  . Not on file  Social Needs  . Financial resource strain: Not on file  . Food insecurity:    Worry: Not on file    Inability: Not on file  . Transportation needs:    Medical: Not on file    Non-medical: Not on file  Tobacco Use  . Smoking status: Never Smoker  Substance and Sexual Activity  . Alcohol use: No  . Drug use: No  . Sexual activity: Not on file  Lifestyle  .  Physical activity:    Days per week: Not on file    Minutes per session: Not on file  . Stress: Not on file  Relationships  . Social connections:    Talks on phone: Not on file    Gets together: Not on file    Attends religious service: Not on file    Active member of club or organization: Not on file    Attends meetings of clubs or organizations: Not on file    Relationship status: Not on file  . Intimate partner violence:    Fear of current or ex partner: Not on file    Emotionally abused: Not on file    Physically abused: Not on file    Forced sexual activity: Not on file  Other Topics Concern  . Not on file  Social History Narrative  . Not on file    Family History:  Denies family history of heart disease. The patient's family history is negative for Colon cancer and Heart disease.    ROS:  Please see the history of present illness.  All other ROS reviewed and negative.     Physical Exam/Data:   Vitals:   12/03/17 1449 12/03/17 1451 12/03/17 1500 12/03/17 1556  BP:   (!) 168/101   Pulse:  87    Resp:  18 (!) 21   Temp:  (!) 97.4 F (36.3 C)    TempSrc:  Oral    SpO2:  99%  100%  Weight: 91.2 kg     Height: 6' (1.829 m)      No intake or output data in the 24 hours ending 12/03/17 1610 Filed Weights   12/03/17 1449  Weight: 91.2 kg   Body mass index is 27.26 kg/m.  General:  Well nourished, well developed, laying on the cath lab table in no acute distress HEENT: sclera anicteric Neck: no JVD Endocrine:  No thryomegaly Vascular: No carotid bruits Cardiac:  normal S1, S2; RRR; no murmurs, rubs, or gallops Lungs:  clear to auscultation bilaterally, no wheezing, rhonchi or rales  Abd: soft, nontender, no hepatomegaly  Ext: no edema Musculoskeletal:  No deformities, BUE and BLE strength normal and equal Skin: warm and dry  Neuro:  CNs 2-12 intact, no focal abnormalities noted Psych:  Normal affect    EKG:  Sinus rhythm with anterolateral STE  Relevant  CV Studies: Left heart catheterization: pending  Laboratory Data:  Chemistry Recent Labs  Lab 12/03/17 1454  NA 135  K 4.2  CL 100  CO2 26  GLUCOSE 128*  BUN 14  CREATININE 1.06  CALCIUM 9.9  GFRNONAA >60  GFRAA >60  ANIONGAP 9    Recent Labs  Lab 12/03/17 1454  PROT 9.0*  ALBUMIN 5.2*  AST 38  ALT 25  ALKPHOS 60  BILITOT 0.8   Hematology Recent Labs  Lab 12/03/17 1454  WBC 11.8*  RBC 5.06  HGB 16.7  HCT 48.4  MCV 95.7  MCH 33.0  MCHC 34.5  RDW 13.0  PLT 267   Cardiac Enzymes Recent Labs  Lab 12/03/17 1454  TROPONINI 0.80*    Recent Labs  Lab 12/03/17 1451  TROPIPOC 0.39*    BNPNo results for input(s): BNP, PROBNP in the last 168 hours.  DDimer No results for input(s): DDIMER in the last 168 hours.  Radiology/Studies:  No results found.  Assessment and Plan:   1. Anterolateral STEMI: patient presented to [facility] with complaints of chest pain x1 day. Trop 0.39>0.80. EKG with anterolateral STE. Transferred to [facility] for cardiac catheterization. BP elevated on arrival but he denies prior HTN history. FLP with elevated LDL and triglycerides.  - Left heart catheterization underway with Dr. Excell Seltzer - Will check HgbA1C for further risk stratification - Continue aspirin 81mg  daily going forward - Will start high-intensity statin - 80mg  atorvastatin daily - Will check an echocardiogram - Further medication recommendations pending LHC/echo findings - anticipate starting low dose BBlocker  2. HLD: Tcholesterol 241, LDL 127, Triglycerides 268 - Will start high-intensity statin   3. Chronic migraines: follows outpatient with neurology - takes percocet and flexeril prn for migraines - Can given prn medications if migraine occurs  4. CKD stage 2: Cr 1.06 (appears to be baseline) - Monitor Cr closely after catheterization  Severity of Illness: The appropriate patient status for this patient is INPATIENT. Inpatient status is judged to be  reasonable and necessary in order to provide the required intensity of service to ensure the patient's safety. The patient's presenting symptoms, physical exam findings, and initial radiographic and laboratory data in the context of their chronic comorbidities is felt to place them at high risk for further clinical deterioration. Furthermore, it is not anticipated that the patient will be medically stable for discharge from the hospital within 2 midnights of admission. The following factors support the patient status of inpatient.   " The patient's presenting symptoms include chest pain. " The worrisome physical exam findings include ongoing chest pain. " The initial radiographic and laboratory data are worrisome because of STEMI on EKG. " The chronic co-morbidities include HTN, HLD, DM, migraines.   * I certify that at the point of admission it is my clinical judgment that the patient will require inpatient hospital care spanning beyond 2 midnights from the point of admission due to high intensity of service, high risk for further deterioration and high frequency of surveillance required.    For questions or updates, please contact [facility] Please consult [email or web address] for contact info under      Signed, Beatriz Stallion, PA-C  12/03/2017 4:10 PM   Patient seen, examined. Available data reviewed. Agree with findings, assessment, and plan as outlined by Judy Pimple, PA-C.  The patient is independently interviewed and examined.  He is an alert, oriented male in no acute distress.  JVP is normal, carotid upstrokes are normal without bruits, lungs are clear, heart is regular rate and rhythm with no murmur or gallop, abdomen is soft nontender, extremities show no edema.  EKG confirms anterior STEMI pattern.  Plan to proceed emergently with cardiac catheterization and PCI.  Emergency implied consent is obtained.  The patient is received aspirin and heparin per protocol.  Further plans pending  his cardiac catheterization results.  [name], M.D. 12/03/2017  5:06 PM

## 2017-12-03 NOTE — ED Notes (Signed)
Date and time results received: 12/03/17 1507 (use smartphrase ".now" to insert current time)  Test: Troponin Critical Value:0.39  Name of Provider Notified: Dr Hyacinth Meeker Orders Received? Or Actions Taken?: NA

## 2017-12-04 ENCOUNTER — Encounter (HOSPITAL_COMMUNITY): Payer: Self-pay | Admitting: Cardiovascular Disease

## 2017-12-04 ENCOUNTER — Inpatient Hospital Stay (HOSPITAL_COMMUNITY): Payer: Medicare Other

## 2017-12-04 DIAGNOSIS — I5021 Acute systolic (congestive) heart failure: Secondary | ICD-10-CM

## 2017-12-04 DIAGNOSIS — I2510 Atherosclerotic heart disease of native coronary artery without angina pectoris: Secondary | ICD-10-CM

## 2017-12-04 LAB — BASIC METABOLIC PANEL
Anion gap: 13 (ref 5–15)
BUN: 12 mg/dL (ref 8–23)
CHLORIDE: 100 mmol/L (ref 98–111)
CO2: 23 mmol/L (ref 22–32)
CREATININE: 0.96 mg/dL (ref 0.61–1.24)
Calcium: 9.7 mg/dL (ref 8.9–10.3)
GFR calc non Af Amer: >60 mL/min (ref >60)
GLUCOSE: 112 mg/dL — AB (ref 70–99)
Potassium: 4.2 mmol/L (ref 3.5–5.1)
Sodium: 136 mmol/L (ref 135–145)

## 2017-12-04 LAB — CBC
HCT: 48.1 % (ref 39.0–52.0)
Hemoglobin: 16.2 g/dL (ref 13.0–17.0)
MCH: 31.9 pg (ref 26.0–34.0)
MCHC: 33.7 g/dL (ref 30.0–36.0)
MCV: 94.7 fL (ref 80.0–100.0)
PLATELETS: 279 10*3/uL (ref 150–400)
RBC: 5.08 MIL/uL (ref 4.22–5.81)
RDW: 12.8 % (ref 11.5–15.5)
WBC: 11.4 10*3/uL — ABNORMAL HIGH (ref 4.0–10.5)

## 2017-12-04 LAB — HEMOGLOBIN A1C
HEMOGLOBIN A1C: 5.8 % — AB (ref 4.8–5.6)
MEAN PLASMA GLUCOSE: 119.76 mg/dL

## 2017-12-04 LAB — LIPID PANEL
CHOLESTEROL: 200 mg/dL (ref 0–200)
HDL: 60 mg/dL (ref >40)
LDL Cholesterol: 107 mg/dL — ABNORMAL HIGH (ref 0–99)
Total CHOL/HDL Ratio: 3.3 RATIO
Triglycerides: 163 mg/dL — ABNORMAL HIGH (ref <150)
VLDL: 33 mg/dL (ref 0–40)

## 2017-12-04 LAB — ECHOCARDIOGRAM COMPLETE
Height: 72 in
WEIGHTICAEL: 3216.95 [oz_av]

## 2017-12-04 LAB — TROPONIN I: Troponin I: >65 ng/mL (ref <0.03)

## 2017-12-04 MED ORDER — LOSARTAN POTASSIUM 25 MG PO TABS
25.0000 mg | ORAL_TABLET | Freq: Every day | ORAL | Status: DC
  Administered 2017-12-04 – 2017-12-06 (×3): 25 mg via ORAL
  Filled 2017-12-04 (×3): qty 1

## 2017-12-04 MED ORDER — SPIRONOLACTONE 12.5 MG HALF TABLET
12.5000 mg | ORAL_TABLET | Freq: Every evening | ORAL | Status: DC
  Administered 2017-12-04 – 2017-12-05 (×2): 12.5 mg via ORAL
  Filled 2017-12-04 (×2): qty 1

## 2017-12-04 MED ORDER — CARVEDILOL 3.125 MG PO TABS
3.1250 mg | ORAL_TABLET | Freq: Two times a day (BID) | ORAL | Status: DC
  Administered 2017-12-04 – 2017-12-05 (×3): 3.125 mg via ORAL
  Filled 2017-12-04 (×3): qty 1

## 2017-12-04 MED FILL — Verapamil HCl IV Soln 2.5 MG/ML: INTRAVENOUS | Qty: 2 | Status: AC

## 2017-12-04 MED FILL — Lidocaine HCl Local Preservative Free (PF) Inj 1%: INTRAMUSCULAR | Qty: 30 | Status: AC

## 2017-12-04 NOTE — Plan of Care (Signed)

## 2017-12-04 NOTE — Progress Notes (Addendum)
Progress Note  Patient Name: Thomas Black. Date of Encounter: 12/04/2017  Primary Cardiologist: No primary care provider on file.   Subjective   Patient is feeling better this morning. Denies chest pain or SOB. Updated on Cath results and need for continued monitoring in CCU.  Inpatient Medications    Scheduled Meds: . aspirin EC  81 mg Oral Daily  . atorvastatin  80 mg Oral q1800  . famotidine  20 mg Oral QHS  . sodium chloride flush  3 mL Intravenous Q12H  . ticagrelor  90 mg Oral BID   Continuous Infusions: . sodium chloride 20 mL/hr at 12/03/17 1508  . sodium chloride     PRN Meds: sodium chloride, acetaminophen, Influenza vac split quadrivalent PF, morphine injection, ondansetron (ZOFRAN) IV, oxyCODONE, sodium chloride flush   Vital Signs    Vitals:   12/04/17 0600 12/04/17 0700 12/04/17 0757 12/04/17 0800  BP: 132/82 131/82  130/90  Pulse:    72  Resp: 10 11  13   Temp:   98.8 F (37.1 C)   TempSrc:   Oral   SpO2: 98%   98%  Weight:      Height:        Intake/Output Summary (Last 24 hours) at 12/04/2017 0842 Last data filed at 12/04/2017 0800 Gross per 24 hour  Intake 371.29 ml  Output 2255 ml  Net -1883.71 ml   Filed Weights   12/03/17 1449 12/04/17 0500  Weight: 91.2 kg 91.2 kg    Telemetry    Sinus Rhythm, Three 5-10 beat runs of NSVT - Personally Reviewed  ECG    Persistent ST-Elevation and Q-Waves V2-V4; Similar, but improved from presenting ECG - Personally Reviewed  Physical Exam   GEN: No acute distress.   Neck: No JVD Cardiac: RRR, no murmurs, rubs, or gallops.  Respiratory: Clear to auscultation bilaterally. GI: Soft, nontender, non-distended  MS: No edema; No deformity, R radial cath site clean, no hematoma, good pulse Neuro:  Nonfocal  Psych: Normal affect   Labs    Chemistry Recent Labs  Lab 12/03/17 1454 12/04/17 0437  NA 135 136  K 4.2 4.2  CL 100 100  CO2 26 23  GLUCOSE 128* 112*  BUN 14 12  CREATININE  1.06 0.96  CALCIUM 9.9 9.7  PROT 9.0*  --   ALBUMIN 5.2*  --   AST 38  --   ALT 25  --   ALKPHOS 60  --   BILITOT 0.8  --   GFRNONAA >60 >60  GFRAA >60 >60  ANIONGAP 9 13     Hematology Recent Labs  Lab 12/03/17 1454 12/03/17 2211 12/04/17 0437  WBC 11.8*  --  11.4*  RBC 5.06  --  5.08  HGB 16.7  --  16.2  HCT 48.4  --  48.1  MCV 95.7  --  94.7  MCH 33.0  --  31.9  MCHC 34.5  --  33.7  RDW 13.0  --  12.8  PLT 267 281 279    Cardiac Enzymes Recent Labs  Lab 12/03/17 1454 12/03/17 1901 12/03/17 2211 12/04/17 0437  TROPONINI 0.80* 62.19* >65.00* >65.00*    Recent Labs  Lab 12/03/17 1451  TROPIPOC 0.39*     BNPNo results for input(s): BNP, PROBNP in the last 168 hours.   DDimer No results for input(s): DDIMER in the last 168 hours.   Radiology    No results found.  Cardiac Studies   Cath 10/7 - Mid  RCA lesion is 40% stenosed. - Dist RCA lesion is 40% stenosed. - Ost Ramus to Ramus lesion is 75% stenosed. - Prox LAD lesion is 95% stenosed. - Mid LAD to Dist LAD lesion is 50% stenosed. - A drug-eluting stent was successfully placed using a STENT RESOLUTE ONYX 3.0X15. - Post intervention, there is a 0% residual stenosis. - There is moderate to severe left ventricular systolic dysfunction. - LV end diastolic pressure is moderately elevated.   Echo Today  Patient Profile     77 y.o. male with no significant PMHx presented as transfer from [facility] for code STEMI. S/P DES to 95% occluded LAD, 0% residual occlusion. Several other lower grade lesion including 75% at ramus not requiring intervention. Patient has reduced EF on ventriculogram, persistent ST-elevations, and persistently elevated Trop >65 concerning for significant tissue damage. Will remain in CCU for continued monitoring.  Assessment & Plan    1) STEMI: Presented from [facility] as Code STEMI with elevations in V2-V4 and Trop peak >65. Cath showed culprit lesion of 95% occluded LAD, now 0% s/p DES  placement. Patient noted to have persistent ST-elevation on AM ECG and Troponin remains >65, concerning for significant tissue damage. EF 35% by ventriculogram, going for Echo today (will evaluate EF and for presence of aneurysm or thrombus). Will continue to monitor in CCU.  - Continue DAPT (Brilinta/ASA) and Atorvastatin 80mg  Daily - Start Coreg 3.125 BID, Losartan 25mg  Daily - Stop Nitro gtt - PRN pain control - Echo (Evaluate for EF, Signs of Aneurysm/Thrombus)  2) HFrEF, Ischemic Cardiomyopathy: EF reduced to 35% on Ventriculogram following STEMI. No history of heartfailure/Cardiomyopathy. Echo today to evaluate from improvement post intervention. Euvolemic on exam, no diuresis at this time. - Start Coreg 3.125 BID, Losartan 25mg  Daily, and Spironolactone 12.5mg  Daily - Echo  3) HTN: Presented with BP 160s. No history of HTN. - On Coreg, Losartan, and Spironolactone as above   For questions or updates, please contact [facility] Please consult [email or web address] for contact info under     Signed, Beola Cord, MD  12/04/2017, 8:42 AM   Patient seen and examined and history reviewed. Agree with above findings and plan. Cath films personally reviewed and all lab data and Ecgs reviewed personally  Patient feels well this am. No recurrent chest pain. VSS. Minimal ventricular ectopy.  Lungs are clear. No JVD CV RRR without gallop, murmur, or rub. Abd soft NT. No LE edema. Right radial site without hematoma  Ecg shows Q waves and loss of R wave V1-5 with persistent ST elevation.  Troponin > 65. Echo pending.  Patient had successful emergent revascularization with stenting of the proximal LAD. He still appears to have significant myocardial injury.  - continue DAPT for one year. - high dose statin therapy - initiate Coreg, losartan, Aldactone for LV dysfunction. - ambulate in unit today with cardiac rehab.  - assess LV function by Echo and also rule out apical thrombus.  - if EF  < 35% will need to consider Lifevest.   Peter [place], MDFACC 12/04/2017 12:59 PM

## 2017-12-04 NOTE — Progress Notes (Signed)
EKG CRITICAL VALUE     12 lead EKG performed.  Critical value noted. Bluford Main, RN notified.   Deitra Mayo, CCT 12/04/2017 6:57 AM

## 2017-12-04 NOTE — Care Management (Signed)
#   1.  S/W MIKE  @ [facility] # [phone]  TICAGRELOR: NONE FORMULARY  BRILINTA  90 MG BID COVER- YES CO-PAY- $ 40.00 TIER- 2 DRUG PRIOR APPROVAL- NO  PREFERRED PHARMACY : YES [facility] AND [facility] M/O 90 DAY SUPPLY FOR M/O $ 80.00

## 2017-12-04 NOTE — Care Management Note (Signed)
Case Management Note  Patient Details  Name: Suhayb Anzalone. MRN: [id number] Date of Birth: 1940/04/09  Subjective/Objective:  77 y.o. male presented as code STEMI; s/p cath with stent.                   Action/Plan: CM met with patient to discuss transitional needs. Patient lives at home with spouse, independent with ADLs with no DME in use. Patient verified PCP as: Dr. Asencion Noble; pharmacy of choice: [place]. Brilinta benefits check completed with est cost $40/month with patient informed and a Brilinta free 30 day card provided. CM discussed the [facility] service, with patient requesting Rxs be filled prior to transitioning home. Patient indicated family would provide transportation home. CM will continue to follow.   Expected Discharge Date:                  Expected Discharge Plan:  Home/Self Care  In-House Referral:  NA  Discharge planning Services  CM Consult, Medication Assistance(Brilinta benefits check; Brilinta card provided)  Post Acute Care Choice:  NA Choice offered to:  NA  DME Arranged:  N/A DME Agency:  NA  HH Arranged:  NA HH Agency:  NA  Status of Service:  In process, will continue to follow  If discussed at Long Length of Stay Meetings, dates discussed:    Additional Comments:  Midge Minium RN, BSN, NCM-BC, ACM-RN [phone] 12/04/2017, 1:15 PM

## 2017-12-04 NOTE — Progress Notes (Signed)
  Echocardiogram 2D Echocardiogram has been performed.  Delcie Roch 12/04/2017, 1:53 PM

## 2017-12-04 NOTE — Progress Notes (Signed)
CARDIAC REHAB PHASE I   PRE:  Rate/Rhythm: 69 SR  BP:  Sitting: 120/86      SaO2: 98 RA  MODE:  Ambulation: 370 ft   POST:  Rate/Rhythm: 87 SR  BP:  Sitting: 119/76    SaO2: 99 RA   Pt ambulated 373ft in hallway, standby assist. Pt denies CP or SOB. Pt given MI booklet. Will review with pt and family tomorrow.   [id number] Reynold Bowen, RN BSN 12/04/2017 3:21 PM

## 2017-12-05 LAB — GLUCOSE, CAPILLARY: Glucose-Capillary: 93 mg/dL (ref 70–99)

## 2017-12-05 LAB — BASIC METABOLIC PANEL
Anion gap: 11 (ref 5–15)
BUN: 13 mg/dL (ref 8–23)
CALCIUM: 9.5 mg/dL (ref 8.9–10.3)
CO2: 25 mmol/L (ref 22–32)
Chloride: 100 mmol/L (ref 98–111)
Creatinine, Ser: 1.02 mg/dL (ref 0.61–1.24)
GFR calc Af Amer: >60 mL/min (ref >60)
GLUCOSE: 102 mg/dL — AB (ref 70–99)
Potassium: 3.9 mmol/L (ref 3.5–5.1)
Sodium: 136 mmol/L (ref 135–145)

## 2017-12-05 MED ORDER — CARVEDILOL 6.25 MG PO TABS
6.2500 mg | ORAL_TABLET | Freq: Two times a day (BID) | ORAL | Status: DC
  Administered 2017-12-05 – 2017-12-06 (×2): 6.25 mg via ORAL
  Filled 2017-12-05 (×2): qty 1

## 2017-12-05 MED ORDER — FAMOTIDINE 20 MG PO TABS: 20.0000 mg | ORAL_TABLET | Freq: Every evening | ORAL | Status: DC | PRN

## 2017-12-05 NOTE — Progress Notes (Signed)
EKG CRITICAL VALUE     12 lead EKG performed.  Critical value noted.  Dahlia Client, RN notified.   Lorelee Market, CCT 12/05/2017 7:39 AM

## 2017-12-05 NOTE — Progress Notes (Signed)
CARDIAC REHAB PHASE I   PRE:  Rate/Rhythm: 76 SR    BP: sitting 104/81    SaO2:   MODE:  Ambulation: 1110 ft   POST:  Rate/Rhythm: 106 ST    BP: sitting 135/78     SaO2:   Pt ambulated 3 laps at quick pace. Denied significant SOB. He did suddenly realize his 4th finger on right hand felt numb. Noted less color and no waveform on pulse ox. Lasted 10-15 min then suddenly resolved. He had color and feeling again. Ed completed with pt with good reception. Discussed importance of Brilinta. Gave walking gl to build up again, will refer to [facility] CRPII. Also discussed daily wts and low sodium. He voices understanding. He has been planning a trip to [place] with his son next month and I encouraged him to discuss this with his MD. [phone]   Harriet Masson CES, ACSM 12/05/2017 1:58 PM

## 2017-12-05 NOTE — Progress Notes (Addendum)
Progress Note  Patient Name: Thomas Black. Date of Encounter: 12/05/2017  Primary Cardiologist: No primary care provider on file.   Subjective   Patient is feeling okay this morning. He denies chest pain or SOB. Rehab program, slow resumption of activity at home, and follow up schedule discussed.  Inpatient Medications    Scheduled Meds: . aspirin EC  81 mg Oral Daily  . atorvastatin  80 mg Oral q1800  . carvedilol  3.125 mg Oral BID WC  . famotidine  20 mg Oral QHS  . losartan  25 mg Oral Daily  . sodium chloride flush  3 mL Intravenous Q12H  . spironolactone  12.5 mg Oral QPM  . ticagrelor  90 mg Oral BID   Continuous Infusions: . sodium chloride 20 mL/hr at 12/03/17 1508  . sodium chloride     PRN Meds: sodium chloride, acetaminophen, Influenza vac split quadrivalent PF, morphine injection, ondansetron (ZOFRAN) IV, oxyCODONE, sodium chloride flush   Vital Signs    Vitals:   12/05/17 0400 12/05/17 0500 12/05/17 0600 12/05/17 0758  BP: 125/89 131/75    Pulse: 70 72 100   Resp: 16 (!) 9 18   Temp: 98.7 F (37.1 C)   98.1 F (36.7 C)  TempSrc: Oral   Oral  SpO2: 95% 94% 98%   Weight:      Height:        Intake/Output Summary (Last 24 hours) at 12/05/2017 0844 Last data filed at 12/05/2017 0500 Gross per 24 hour  Intake 120 ml  Output 1150 ml  Net -1030 ml   Filed Weights   12/03/17 1449 12/04/17 0500 12/05/17 0217  Weight: 91.2 kg 91.2 kg 90.4 kg    Telemetry    Sinus Rhythm, low PVC burden - Personally Reviewed  ECG    Persistent ST-Elevation and Q-Waves V2-V4 - Personally Reviewed  Physical Exam   GEN: No acute distress.   Neck: No JVD Cardiac: RRR, no murmurs, rubs, or gallops.  Respiratory: Clear to auscultation bilaterally. GI: Soft, nontender, non-distended  MS: No edema; No deformity, R radial cath site clean,mild bruising, no hematoma, good pulse Neuro:  Nonfocal  Psych: Normal affect   Labs    Chemistry Recent Labs  Lab  12/03/17 1454 12/04/17 0437 12/05/17 0236  NA 135 136 136  K 4.2 4.2 3.9  CL 100 100 100  CO2 26 23 25   GLUCOSE 128* 112* 102*  BUN 14 12 13   CREATININE 1.06 0.96 1.02  CALCIUM 9.9 9.7 9.5  PROT 9.0*  --   --   ALBUMIN 5.2*  --   --   AST 38  --   --   ALT 25  --   --   ALKPHOS 60  --   --   BILITOT 0.8  --   --   GFRNONAA >60 >60 >60  GFRAA >60 >60 >60  ANIONGAP 9 13 11      Hematology Recent Labs  Lab 12/03/17 1454 12/03/17 2211 12/04/17 0437  WBC 11.8*  --  11.4*  RBC 5.06  --  5.08  HGB 16.7  --  16.2  HCT 48.4  --  48.1  MCV 95.7  --  94.7  MCH 33.0  --  31.9  MCHC 34.5  --  33.7  RDW 13.0  --  12.8  PLT 267 281 279    Cardiac Enzymes Recent Labs  Lab 12/03/17 1454 12/03/17 1901 12/03/17 2211 12/04/17 0437  TROPONINI 0.80* 62.19* >65.00* >  65.00*    Recent Labs  Lab 12/03/17 1451  TROPIPOC 0.39*     BNPNo results for input(s): BNP, PROBNP in the last 168 hours.   DDimer No results for input(s): DDIMER in the last 168 hours.   Radiology    No results found.  Cardiac Studies   Cath 10/7 - Mid RCA lesion is 40% stenosed. - Dist RCA lesion is 40% stenosed. - Ost Ramus to Ramus lesion is 75% stenosed. - Prox LAD lesion is 95% stenosed. - Mid LAD to Dist LAD lesion is 50% stenosed. - A drug-eluting stent was successfully placed using a STENT RESOLUTE ONYX 3.0X15. - Post intervention, there is a 0% residual stenosis. - There is moderate to severe left ventricular systolic dysfunction. - LV end diastolic pressure is moderately elevated.   Echo 10/8 Study Conclusions - Left ventricle: The cavity size was mildly dilated. There was   mild focal basal hypertrophy of the septum. Systolic function was   moderately reduced. The estimated ejection fraction was in the   range of 35% to 40%. There is akinesis of the anteroseptal and   apical myocardium. Doppler parameters are consistent with   abnormal left ventricular relaxation (grade 1 diastolic    dysfunction). - Aortic valve: There was trivial regurgitation. Impressions: - Akinesis of the anteroseptal wall and apex with overall moderate   LV dysfunction; mild diastolic dysfunction; trace AI.  Patient Profile     77 y.o. male with no significant PMHx presented as [facility] for code [facility]I. S/P DES to 95% occluded LAD, 0% residual occlusion. Several other lower grade lesion including 75% at ramus not requiring intervention. EF ~35% on ventriculogram, persistent ST-elevations concerning for significant tissue damage. Echo showed EF 35-40%.  Assessment & Plan    1) STEMI: Presented from [facility] as Code STEMI with elevations in V2-V4 and Trop peak >65. Cath showed culprit lesion of 95% occluded LAD, now 0% s/p DES placement. Patient noted to have persistent ST-elevation on repeat ECG. EF 35% on Cath. Echo showed EF 35-40%, anteroseptal and apical akinesis, G1DD. Stable for transfer to telemetry today, hopefully home tomorrow.  - Continue DAPT (Brilinta/ASA) and Atorvastatin 80mg  Daily - Continue Losartan 25mg  Daily - Increase Coreg to 6.25 BID  2) HFrEF, Ischemic Cardiomyopathy: EF reduced to 35% on Ventriculogram following STEMI. No history of heartfailure/Cardiomyopathy. Echo showed EF 35-40%, anteroseptal and apikal akinesis, G1DD, no thrombus. Euvolemic on exam, no diuresis at this time. - Coreg 6.25 BID, Losartan 25mg  Daily, and Spironolactone 12.5mg  Daily  3) HTN: Presented with BP 160s. Normotensive this AM. No history of HTN. - On Coreg, Losartan, and Spironolactone as above   For questions or updates, please contact [facility] Please consult [email or web address] for contact info under     Signed, Beola Cord, MD  12/05/2017, 8:44 AM

## 2017-12-06 ENCOUNTER — Telehealth: Payer: Self-pay

## 2017-12-06 LAB — BASIC METABOLIC PANEL
ANION GAP: 9 (ref 5–15)
BUN: 19 mg/dL (ref 8–23)
CALCIUM: 9.3 mg/dL (ref 8.9–10.3)
CHLORIDE: 102 mmol/L (ref 98–111)
CO2: 24 mmol/L (ref 22–32)
Creatinine, Ser: 1.04 mg/dL (ref 0.61–1.24)
GFR calc Af Amer: >60 mL/min (ref >60)
GFR calc non Af Amer: >60 mL/min (ref >60)
Glucose, Bld: 98 mg/dL (ref 70–99)
POTASSIUM: 3.9 mmol/L (ref 3.5–5.1)
Sodium: 135 mmol/L (ref 135–145)

## 2017-12-06 MED ORDER — LOSARTAN POTASSIUM 25 MG PO TABS: 25.0000 mg | ORAL_TABLET | Freq: Every day | ORAL | 0 refills | Status: DC

## 2017-12-06 MED ORDER — TICAGRELOR 90 MG PO TABS: 90.0000 mg | ORAL_TABLET | Freq: Two times a day (BID) | ORAL | 0 refills | Status: DC

## 2017-12-06 MED ORDER — ASPIRIN 81 MG PO TBEC: 81.0000 mg | DELAYED_RELEASE_TABLET | Freq: Every day | ORAL | 0 refills | Status: AC

## 2017-12-06 MED ORDER — SPIRONOLACTONE 25 MG PO TABS: 12.5000 mg | ORAL_TABLET | Freq: Every evening | ORAL | 0 refills | Status: DC

## 2017-12-06 MED ORDER — CARVEDILOL 6.25 MG PO TABS: 6.2500 mg | ORAL_TABLET | Freq: Two times a day (BID) | ORAL | 0 refills | Status: DC

## 2017-12-06 MED ORDER — ATORVASTATIN CALCIUM 80 MG PO TABS: 80.0000 mg | ORAL_TABLET | Freq: Every day | ORAL | 0 refills | Status: DC

## 2017-12-06 MED FILL — SPIRONOLACTONE 25 MG TABLET: 25 | 60 days supply | Qty: 30 | Fill #0

## 2017-12-06 MED FILL — CARVEDILOL 6.25 MG TABLET: 6.25 | 30 days supply | Qty: 60 | Fill #0

## 2017-12-06 MED FILL — ATORVASTATIN CALCIUM 80 MG: 80 | 30 days supply | Qty: 30 | Fill #0

## 2017-12-06 MED FILL — ASPIRIN LOW DOSE 81 MG TBEC: 81 | 30 days supply | Qty: 30 | Fill #0

## 2017-12-06 MED FILL — BRILINTA 90 MG TABLET: 90 | 30 days supply | Qty: 60 | Fill #0

## 2017-12-06 MED FILL — LOSARTAN POTASSIUM 25 MG TA: 25 | 30 days supply | Qty: 30 | Fill #0

## 2017-12-06 NOTE — Progress Notes (Signed)
Progress Note  Patient Name: Thomas Black. Date of Encounter: 12/06/2017  Primary Cardiologist: No primary care provider on file.   Subjective   Patient is feeling well today, up in bedside chair. He denies chest pain or SOB. He has been working with rehab, walking the unit. Short episode of R 4th digit numbness yesterday, which spontaneously resolved.  Inpatient Medications    Scheduled Meds: . aspirin EC  81 mg Oral Daily  . atorvastatin  80 mg Oral q1800  . carvedilol  6.25 mg Oral BID WC  . famotidine  20 mg Oral QHS  . losartan  25 mg Oral Daily  . sodium chloride flush  3 mL Intravenous Q12H  . spironolactone  12.5 mg Oral QPM  . ticagrelor  90 mg Oral BID   Continuous Infusions: . sodium chloride 20 mL/hr at 12/03/17 1508  . sodium chloride     PRN Meds: sodium chloride, acetaminophen, famotidine, Influenza vac split quadrivalent PF, ondansetron (ZOFRAN) IV, sodium chloride flush   Vital Signs    Vitals:   12/06/17 0000 12/06/17 0406 12/06/17 0500 12/06/17 0802  BP: 117/65 134/76    Pulse: 74 70    Resp: 17 (!) 27    Temp:  97.6 F (36.4 C)  97.7 F (36.5 C)  TempSrc:  Oral  Oral  SpO2: 95% 90%  96%  Weight:   89.9 kg   Height:        Intake/Output Summary (Last 24 hours) at 12/06/2017 0859 Last data filed at 12/06/2017 0646 Gross per 24 hour  Intake 250 ml  Output 1 ml  Net 249 ml   Filed Weights   12/04/17 0500 12/05/17 0217 12/06/17 0500  Weight: 91.2 kg 90.4 kg 89.9 kg    Telemetry    Sinus Rhythm, few PVCs - Personally Reviewed  ECG    None today  Physical Exam   GEN: No acute distress.   Neck: No JVD Cardiac: RRR, no murmurs, rubs, or gallops.  Respiratory: Clear to auscultation bilaterally. GI: Soft, nontender, non-distended  MS: No edema; No deformity; R radial cath site clean, some bruising, no hematoma, good pulse Neuro:  Nonfocal  Psych: Normal affect   Labs    Chemistry Recent Labs  Lab 12/03/17 1454  12/04/17 0437 12/05/17 0236 12/06/17 0407  NA 135 136 136 135  K 4.2 4.2 3.9 3.9  CL 100 100 100 102  CO2 26 23 25 24   GLUCOSE 128* 112* 102* 98  BUN 14 12 13 19   CREATININE 1.06 0.96 1.02 1.04  CALCIUM 9.9 9.7 9.5 9.3  PROT 9.0*  --   --   --   ALBUMIN 5.2*  --   --   --   AST 38  --   --   --   ALT 25  --   --   --   ALKPHOS 60  --   --   --   BILITOT 0.8  --   --   --   GFRNONAA >60 >60 >60 >60  GFRAA >60 >60 >60 >60  ANIONGAP 9 13 11 9      Hematology Recent Labs  Lab 12/03/17 1454 12/03/17 2211 12/04/17 0437  WBC 11.8*  --  11.4*  RBC 5.06  --  5.08  HGB 16.7  --  16.2  HCT 48.4  --  48.1  MCV 95.7  --  94.7  MCH 33.0  --  31.9  MCHC 34.5  --  33.7  RDW 13.0  --  12.8  PLT 267 281 279    Cardiac Enzymes Recent Labs  Lab 12/03/17 1454 12/03/17 1901 12/03/17 2211 12/04/17 0437  TROPONINI 0.80* 62.19* >65.00* >65.00*    Recent Labs  Lab 12/03/17 1451  TROPIPOC 0.39*     BNPNo results for input(s): BNP, PROBNP in the last 168 hours.   DDimer No results for input(s): DDIMER in the last 168 hours.   Radiology    No results found.  Cardiac Studies   Cath 10/7 - Mid RCA lesion is 40% stenosed. - Dist RCA lesion is 40% stenosed. - Ost Ramus to Ramus lesion is 75% stenosed. - Prox LAD lesion is 95% stenosed. - Mid LAD to Dist LAD lesion is 50% stenosed. - A drug-eluting stent was successfully placed using a STENT RESOLUTE ONYX 3.0X15. - Post intervention, there is a 0% residual stenosis. - There is moderate to severe left ventricular systolic dysfunction. - LV end diastolic pressure is moderately elevated.   Echo 10/8 Study Conclusions - Left ventricle: The cavity size was mildly dilated. There was   mild focal basal hypertrophy of the septum. Systolic function was   moderately reduced. The estimated ejection fraction was in the   range of 35% to 40%. There is akinesis of the anteroseptal and   apical myocardium. Doppler parameters are  consistent with   abnormal left ventricular relaxation (grade 1 diastolic   dysfunction). - Aortic valve: There was trivial regurgitation. Impressions: - Akinesis of the anteroseptal wall and apex with overall moderate   LV dysfunction; mild diastolic dysfunction; trace AI.  Patient Profile     77 y.o. male with no significant PMHx presented [facility] for [facility]I. S/P DES to 95% occluded LAD, 0% residual occlusion. Several other lower grade lesion including 75% at ramus not requiring intervention. EF ~35% on ventriculogram, persistent ST- elevations concerning for significant tissue damage. Echo with EF 35-40%.  Assessment & Plan    1) [facility]I: [facility]ed [facility] as Code STEMI with elevations in V2-V4 and Trop peak >65. Cath showed culprit lesion of 95% occluded LAD, now 0% s/p DES placement. Patient noted to have persistent ST-elevation on repeat ECG. EF 35% on Cath. Echo showed EF 35-40%, anteroseptal and apical akinesis, G1DD. Likely home today, will follow up within 2 weeks outpatient. - Continue DAPT (Brilinta/ASA) and Atorvastatin 80mg  Daily, Losartan 25mg  Daily, Coreg 6.25 BID  2) HFrEF, Ischemic Cardiomyopathy: EF reduced to 35% on Ventriculogram following STEMI. No prior history of heartfailure/Cardiomyopathy. Echo showed EF 35-40%, anteroseptal and apikal akinesis, G1DD, no thrombus. No Lifevest or ICD indicated with EF>35%. Remains euvolemic on exam. - Continue Coreg 6.25 BID, Losartan 25mg  Daily, and Spironolactone 12.5mg  Daily  3) HTN: Presented with BP 160s. Normotensive this AM. No history of HTN. - On Coreg, Losartan, and Spironolactone as above   For questions or updates, please contact [facility] Please consult [email or web address] for contact info under     Signed, Beola Cord, MD  12/06/2017, 8:59 AM

## 2017-12-06 NOTE — Telephone Encounter (Signed)
Patient contacted regarding discharge from [facility] on 12/06/17.  Patient understands to follow up with provider Strader PA-C on 01/01/18 at 1:30 pm at [place]. Patient understands discharge instructions? yes Patient understands medications and regiment? yes Patient understands to bring all medications to this visit? yes

## 2017-12-06 NOTE — Telephone Encounter (Signed)
-----   Message from Vallarie Mare sent at 12/06/2017 11:04 AM EDT ----- TOC scheduled w/ BS on 01/01/18  Thank you

## 2017-12-06 NOTE — Discharge Summary (Signed)
Discharge Summary    Patient ID: Thomas Black. MRN: [id number]; DOB: [birth year]  Admit date: 12/03/2017 Discharge date: 12/06/2017  Primary Care Provider: [name], Black  Primary Cardiologist: No primary care provider on file. Primary Electrophysiologist:  None  Discharge Diagnoses    Active Problems:   STEMI (ST elevation myocardial infarction) (HCC)   Allergies No Known Allergies  Diagnostic Studies/Procedures    Cath 10/7 - Mid RCA lesion is 40% stenosed. - Dist RCA lesion is 40% stenosed. - Ost Ramus to Ramus lesion is 75% stenosed. - Prox LAD lesion is 95% stenosed. - Mid LAD to Dist LAD lesion is 50% stenosed. - A drug-eluting stent was successfully placed using a STENT RESOLUTE ONYX 3.0X15. - Post intervention, there is a 0% residual stenosis. - There is moderate to severe left ventricular systolic dysfunction. - LV end diastolic pressure is moderately elevated.  Echo 10/8 Study Conclusions - Left ventricle: The cavity size was mildly dilated. There was mild focal basal hypertrophy of the septum. Systolic function was moderately reduced. The estimated ejection fraction was in the range of 35% to 40%. There is akinesis of the anteroseptal and apical myocardium. Doppler parameters are consistent with abnormal left ventricular relaxation (grade 1 diastolic dysfunction). - Aortic valve: There was trivial regurgitation.  Impressions: - Akinesis of the anteroseptal wall and apex with overall moderate LV dysfunction; mild diastolic dysfunction; trace AI. _____________   History of Present Illness     Thomas Black was in his usual state of health until the morning 12/02/17 when he began experiencing chest pain with radiation to bilateral shoulders while at church. He reported associated SOB, diaphoresis and nausea at that time. Symptoms continued to wax and wain thoughout the day becoming more persistent that evening. On the morning of 12/03/17, he  attempted to walk his usual 3-4 miles on the treadmill at the [facility], however he was limited by SOB and pain in his right arm prompting him to present to [facility]. EKG on arrival revealed an anterolateral STEMI and patient was transferred to [facility] for urgent cardiac catheterization.   Hospital Course     Consultants: None  STEMI: [facility] as Code STEMI with elevations in V2-V4 and Trop peak >65. Cath showed culprit lesion of 95% occluded LAD, now 0% s/p DES placement. Patient noted to have persistent ST-elevation fir 2 days following cath concerning for significant tissue damage. Echo showed EF 35-40%, anteroseptal and apical akinesis, G1DD, No thrombus. Will need repeat echo in about 2 months to access recovery. Patient tolerated medical therapy well and was discharged on: DAPT (Brilinta/ASA), Atorvastatin 80mg  Daily, Losartan 25mg  Daily, and Coreg 6.25 BID. Patient to follow up in clinic and with cardiac rehab.  HFrEF, Ischemic Cardiomyopathy: EF reduced to 35-40% on Echo with anteroseptal and apical akinesis, and G1DD following STEMI. No history of heartfailure/Cardiomyopathy. Patient received one dose of Lasix following cath, but remained euvolemic without diuresis following this. He tolerated medical therapy well and was discharged on: Coreg 3.125 BID, Losartan 25mg  Daily, and Spironolactone 12.5mg  Daily _____________  Discharge Vitals Blood pressure 120/68, pulse 68, temperature 97.7 F (36.5 C), temperature source Oral, resp. rate 16, height 6' (1.829 m), weight 89.9 kg, SpO2 96 %.  Filed Weights   12/04/17 0500 12/05/17 0217 12/06/17 0500  Weight: 91.2 kg 90.4 kg 89.9 kg    Labs & Radiologic Studies    CBC Recent Labs    12/03/17 1454 12/03/17 2211 12/04/17 0437  WBC 11.8*  --  11.4*  NEUTROABS 9.7*  --   --   HGB 16.7  --  16.2  HCT 48.4  --  48.1  MCV 95.7  --  94.7  PLT 267 281 279   Basic Metabolic Panel Recent Labs    62/13/08 0236 12/06/17 0407  NA  136 135  K 3.9 3.9  CL 100 102  CO2 25 24  GLUCOSE 102* 98  BUN 13 19  CREATININE 1.02 1.04  CALCIUM 9.5 9.3   Liver Function Tests Recent Labs    12/03/17 1454  AST 38  ALT 25  ALKPHOS 60  BILITOT 0.8  PROT 9.0*  ALBUMIN 5.2*   No results for input(s): LIPASE, AMYLASE in the last 72 hours. Cardiac Enzymes Recent Labs    12/03/17 1901 12/03/17 2211 12/04/17 0437  TROPONINI 62.19* >65.00* >65.00*   BNP Invalid input(s): POCBNP D-Dimer No results for input(s): DDIMER in the last 72 hours. Hemoglobin A1C Recent Labs    12/04/17 0436  HGBA1C 5.8*   Fasting Lipid Panel Recent Labs    12/04/17 0437  CHOL 200  HDL 60  LDLCALC 107*  TRIG 163*  CHOLHDL 3.3   Thyroid Function Tests No results for input(s): TSH, T4TOTAL, T3FREE, THYROIDAB in the last 72 hours.  Invalid input(s): FREET3 _____________  No results found. Disposition   Pt is being discharged home today in good condition.  Follow-up Plans & Appointments    Follow-up Information    Thomas Lennox, PA-C Follow up on 01/01/2018.   Specialties:  Physician Assistant, Cardiology Why:  at 1:30om for your follow up appt.  Contact information: [address] [place] [address] [phone]          Discharge Instructions    (HEART FAILURE PATIENTS) Call Black:  Anytime you have any of the following symptoms: 1) 3 pound weight gain in 24 hours or 5 pounds in 1 week 2) shortness of breath, with or without a dry hacking cough 3) swelling in the hands, feet or stomach 4) if you have to sleep on extra pillows at night in order to breathe.   Complete by:  As directed    Amb Referral to Cardiac Rehabilitation   Complete by:  As directed    Diagnosis:   Coronary Stents STEMI PTCA     Call Black for:  difficulty breathing, headache or visual disturbances   Complete by:  As directed    Call Black for:  persistant dizziness or light-headedness   Complete by:  As directed    Call Black for:  redness,  tenderness, or signs of infection (pain, swelling, redness, odor or green/yellow discharge around incision site)   Complete by:  As directed    Call Black for:  severe uncontrolled pain   Complete by:  As directed    Call Black for:  temperature >100.4   Complete by:  As directed    Diet - low sodium heart healthy   Complete by:  As directed    Discharge instructions   Complete by:  As directed    Thank you for allowing [place] to care for you  Your symptoms were due to a heart attack. This reduced blood flow has damaged your heart muscle and resulted in new heart failure. You have improved following stent placement in an artery to your heart along with medical therapy. You have been started on a number on new medications, please take these as instructed.  Follow up with cardiology as scheduled  Follow up with your PCP in the next 2 weeks or so   Driving Restrictions   Complete by:  As directed    Wait 1 week before resuming Driving   Increase activity slowly   Complete by:  As directed       Discharge Medications   Allergies as of 12/06/2017   No Known Allergies     Medication List    TAKE these medications   acetaminophen 325 MG tablet Commonly known as:  TYLENOL Take 650 mg by mouth every 6 (six) hours as needed for mild pain.   aspirin 81 MG EC tablet Take 1 tablet (81 mg total) by mouth daily. Start taking on:  12/07/2017   atorvastatin 80 MG tablet Commonly known as:  LIPITOR Take 1 tablet (80 mg total) by mouth daily at 6 PM.   carvedilol 6.25 MG tablet Commonly known as:  COREG Take 1 tablet (6.25 mg total) by mouth 2 (two) times daily with a meal.   losartan 25 MG tablet Commonly known as:  COZAAR Take 1 tablet (25 mg total) by mouth daily. Start taking on:  12/07/2017   Melatonin 5 MG Caps Take 5 mg by mouth at bedtime.   oxyCODONE-acetaminophen 10-325 MG tablet Commonly known as:  PERCOCET Take 0.5-1 tablets by mouth daily as needed for pain (for migraine  pain). migraines   ranitidine 150 MG tablet Commonly known as:  ZANTAC Take 150 mg by mouth at bedtime.   spironolactone 25 MG tablet Commonly known as:  ALDACTONE Take 0.5 tablets (12.5 mg total) by mouth every evening.   ticagrelor 90 MG Tabs tablet Commonly known as:  BRILINTA Take 1 tablet (90 mg total) by mouth 2 (two) times daily.        Acute coronary syndrome (MI, NSTEMI, STEMI, etc) this admission?: Yes.     AHA/ACC Clinical Performance & Quality Measures: 1. Aspirin prescribed? - Yes 2. ADP Receptor Inhibitor (Plavix/Clopidogrel, Brilinta/Ticagrelor or Effient/Prasugrel) prescribed (includes medically managed patients)? - Yes 3. Beta Blocker prescribed? - Yes 4. High Intensity Statin (Lipitor 40-80mg  or Crestor 20-40mg ) prescribed? - Yes 5. EF assessed during THIS hospitalization? - Yes 6. For EF <40%, was ACEI/ARB prescribed? - Yes 7. For EF <40%, Aldosterone Antagonist (Spironolactone or Eplerenone) prescribed? - Yes 8. Cardiac Rehab Phase II ordered (Included Medically managed Patients)? - Yes     Outstanding Labs/Studies   None  Duration of Discharge Encounter   Greater than 30 minutes including physician time.  Signed, Beola Cord, Black 12/06/2017, 11:27 AM

## 2017-12-31 NOTE — Progress Notes (Signed)
Cardiology Office Note    Date:  01/01/2018   ID:  Thomas Mink., DOB [birth year], MRN [id number]  PCP:  [name], MD  Cardiologist: Followed by Dr. Excell Seltzer during admission --> Wishes to follow-up in [place], [place]  Chief Complaint  Patient presents with  . Hospitalization Follow-up    History of Present Illness:    Thomas Black. is a 77 y.o. male with past medical history of HTN who presents to the office today for hospital follow-up.   He recently presented to [facility] on 12/03/2017 for evaluation of chest pain and dyspnea which started the day prior to his presentation but symptoms acutely worsened that morning as he was unable to walk on a treadmill (previously walking 3-4 miles per day). Initial EKG showed ST elevation along the anterolateral leads, therefore CODE STEMI was activated and he was transferred emergently to [facility] for a cardiac catheterization. Catheterization showed severe thrombotic occlusion of the proximal LAD which was treated with PCI/DES. Also noted to have 75% Ost RI stenosis, 50% mid-LAD, and 40% stenosis along RCA. EF was reduced at 35%. He was started on DAPT with ASA and Brilinta along with Coreg and Atorvastatin being added to his medication regimen. Troponin values peaked at > 65.00 and echocardiogram showed his EF was 35% to 40% with akinesis of the anteroseptal and apical myocardium noted. Was also started on Losartan 25mg  daily and Spironolactone 12.5mg  daily in regards to his cardiomyopathy prior to discharge.   In talking with the patient today, he reports overall doing well since his recent hospitalization and denies any repeat episodes of chest pain. Reports that his breathing has improved and he denies any dyspnea on exertion. Does note occasional episodes where he feels like he cannot "catch his breath" but this improves within a few seconds. He has not noticed an association of when this occurs and when he takes Brilinta. He has been  walking for over 2 miles per day without any recurrent anginal symptoms.  Denies any recent orthopnea, PND, lower extremity edema, or palpitations.  Reports good compliance with ASA and Brilinta, denies missing any recent doses.  Past Medical History:  Diagnosis Date  . Chronic neck pain   . Coronary artery disease    a. 11/2017: s/p anterolateral STEMI with cath showing severe thrombotic occlusion of proximal-LAD --> PCI/DES performed  . Headache(784.0)   . Hypercholesteremia   . Hypertension   . Ischemic cardiomyopathy    a. 11/2017: EF 35% to 40% with akinesis of the anteroseptal and apical myocardium noted  . Migraine   . Muscle spasms of neck   . Myocardial infarction [facility])     Past Surgical History:  Procedure Laterality Date  . CHOLECYSTECTOMY     1/12  . COLONOSCOPY  03/15/2011   Procedure: COLONOSCOPY;  Surgeon: [name], MD;  Location: [facility];  Service: Endoscopy;  Laterality: N/A;  9:30 am  . CORONARY STENT INTERVENTION N/A 12/03/2017   Procedure: CORONARY STENT INTERVENTION;  Surgeon: [name], MD;  Location: [facility] INVASIVE CV LAB;  Service: Cardiovascular;  Laterality: N/A;  . ESOPHAGOGASTRODUODENOSCOPY N/A 06/26/2012   Procedure: ESOPHAGOGASTRODUODENOSCOPY (EGD);  Surgeon: [name], MD;  Location: [facility];  Service: Endoscopy;  Laterality: N/A;  . fractured jaw    . fractured nose    . left acl repair    . LEFT HEART CATH AND CORONARY ANGIOGRAPHY N/A 12/03/2017   Procedure: LEFT HEART CATH AND CORONARY ANGIOGRAPHY;  Surgeon: [name], MD;  Location: [facility] INVASIVE CV LAB;  Service: Cardiovascular;  Laterality: N/A;  . Left Knee Arthroscopy    . TIBIA FRACTURE SURGERY      Current Medications: Outpatient Medications Prior to Visit  Medication Sig Dispense Refill  . acetaminophen (TYLENOL) 325 MG tablet Take 650 mg by mouth every 6 (six) hours as needed for mild pain.    Marland Kitchen aspirin 81 MG EC tablet Take 1 tablet (81 mg total) by  mouth daily. 30 tablet 0  . Melatonin 5 MG CAPS Take 5 mg by mouth at bedtime.    Marland Kitchen oxyCODONE-acetaminophen (PERCOCET) 10-325 MG per tablet Take 0.5-1 tablets by mouth daily as needed for pain (for migraine pain). migraines    . ranitidine (ZANTAC) 150 MG tablet Take 150 mg by mouth at bedtime.    Marland Kitchen atorvastatin (LIPITOR) 80 MG tablet Take 1 tablet (80 mg total) by mouth daily at 6 PM. 30 tablet 0  . carvedilol (COREG) 6.25 MG tablet Take 1 tablet (6.25 mg total) by mouth 2 (two) times daily with a meal. 60 tablet 0  . losartan (COZAAR) 25 MG tablet Take 1 tablet (25 mg total) by mouth daily. 30 tablet 0  . spironolactone (ALDACTONE) 25 MG tablet Take 0.5 tablets (12.5 mg total) by mouth every evening. 30 tablet 0  . ticagrelor (BRILINTA) 90 MG TABS tablet Take 1 tablet (90 mg total) by mouth 2 (two) times daily. 60 tablet 0   No facility-administered medications prior to visit.      Allergies:   Patient has no known allergies.   Social History   Socioeconomic History  . Marital status: Married    Spouse name: Joy  . Number of children: Not on file  . Years of education: Not on file  . Highest education level: Not on file  Occupational History  . Occupation: Former Secretary/administrator  . Financial resource strain: Not on file  . Food insecurity:    Worry: Not on file    Inability: Not on file  . Transportation needs:    Medical: Not on file    Non-medical: Not on file  Tobacco Use  . Smoking status: Never Smoker  . Smokeless tobacco: Never Used  Substance and Sexual Activity  . Alcohol use: No  . Drug use: No  . Sexual activity: Not on file  Lifestyle  . Physical activity:    Days per week: Not on file    Minutes per session: Not on file  . Stress: Not on file  Relationships  . Social connections:    Talks on phone: Not on file    Gets together: Not on file    Attends religious service: Not on file    Active member of club or organization: Not on file     Attends meetings of clubs or organizations: Not on file    Relationship status: Not on file  Other Topics Concern  . Not on file  Social History Narrative  . Not on file     Family History:  The patient's family history includes Cancer in his mother.   Review of Systems:   Please see the history of present illness.     General:  No chills, fever, night sweats or weight changes.  Cardiovascular:  No chest pain, dyspnea on exertion, edema, orthopnea, palpitations, paroxysmal nocturnal dyspnea. Dermatological: No rash, lesions/masses Respiratory: No cough, Positive for intermittent dyspnea. Urologic: No hematuria, dysuria Abdominal:  No nausea, vomiting, diarrhea, bright red blood per rectum, melena, or hematemesis Neurologic:  No visual changes, wkns, changes in mental status. All other systems reviewed and are otherwise negative except as noted above.   Physical Exam:    VS:  BP 138/72   Pulse (!) 55   Ht 6' (1.829 m)   Wt 201 lb 9.6 oz (91.4 kg)   SpO2 97%   BMI 27.34 kg/m    General: Well developed, well nourished elderly Caucasian male appearing in no acute distress. Head: Normocephalic, atraumatic, sclera non-icteric, no xanthomas, nares are without discharge.  Neck: No carotid bruits. JVD not elevated.  Lungs: Respirations regular and unlabored, without wheezes or rales.  Heart: Regular rate and rhythm. No S3 or S4.  No murmur, no rubs, or gallops appreciated. Abdomen: Soft, non-tender, non-distended with normoactive bowel sounds. No hepatomegaly. No rebound/guarding. No obvious abdominal masses. Msk:  Strength and tone appear normal for age. No joint deformities or effusions. Extremities: No clubbing or cyanosis. No lower extremity edema.  Distal pedal pulses are 2+ bilaterally. Radial site stable without ecchymosis or evidence of a hematoma. Radial pulse intact.  Neuro: Alert and oriented X 3. Moves all extremities spontaneously. No focal deficits noted. Psych:   Responds to questions appropriately with a normal affect. Skin: No rashes or lesions noted  Wt Readings from Last 3 Encounters:  01/01/18 201 lb 9.6 oz (91.4 kg)  12/06/17 198 lb 3.1 oz (89.9 kg)  06/26/12 216 lb 11.4 oz (98.3 kg)     Studies/Labs Reviewed:   EKG:  EKG is ordered today. The ekg ordered today demonstrates normal sinus rhythm, heart rate 62, with anterior Q waves. ST elevation now resolved when compared to prior tracings.   Recent Labs: 12/03/2017: ALT 25 12/04/2017: Hemoglobin 16.2; Platelets 279 12/06/2017: BUN 19; Creatinine, Ser 1.04; Potassium 3.9; Sodium 135   Lipid Panel    Component Value Date/Time   CHOL 200 12/04/2017 0437   TRIG 163 (H) 12/04/2017 0437   HDL 60 12/04/2017 0437   CHOLHDL 3.3 12/04/2017 0437   VLDL 33 12/04/2017 0437   LDLCALC 107 (H) 12/04/2017 0437    Additional studies/ records that were reviewed today include:   Cardiac Catheterization: 12/03/2017  Mid RCA lesion is 40% stenosed.  Dist RCA lesion is 40% stenosed.  Ost Ramus to Ramus lesion is 75% stenosed.  Prox LAD lesion is 95% stenosed.  Mid LAD to Dist LAD lesion is 50% stenosed.  A drug-eluting stent was successfully placed using a STENT RESOLUTE ONYX 3.0X15.  Post intervention, there is a 0% residual stenosis.  There is moderate to severe left ventricular systolic dysfunction.  LV end diastolic pressure is moderately elevated.   1.  Severe thrombotic proximal LAD stenosis, treated successfully with PCI using a drug-eluting stent 2.  Moderately severe stenosis of the ramus intermedius 3.  Mild to moderate RCA stenosis 4.  Widely patent left circumflex 5.  Moderately severe LV systolic dysfunction with LVEF estimated at 35% and severely elevated LVEDP  Recommend uninterrupted dual antiplatelet therapy with Aspirin 81mg  daily and Ticagrelor 90mg  twice daily for a minimum of 12 months (ACS - Class I recommendation).   Initial presentation consistent with acute  systolic heart failure with LAD territory infarction, severe LV dysfunction, and severely elevated LVEDP.  The patient will be given IV furosemide in the Cath Lab in addition to routine post MI medical therapy.  He will be monitored carefully in the CV-ICU.  Echocardiogram: 12/04/2017 Study Conclusions  -  Left ventricle: The cavity size was mildly dilated. There was   mild focal basal hypertrophy of the septum. Systolic function was   moderately reduced. The estimated ejection fraction was in the   range of 35% to 40%. There is akinesis of the anteroseptal and   apical myocardium. Doppler parameters are consistent with   abnormal left ventricular relaxation (grade 1 diastolic   dysfunction). - Aortic valve: There was trivial regurgitation.  Impressions:  - Akinesis of the anteroseptal wall and apex with overall moderate   LV dysfunction; mild diastolic dysfunction; trace AI.  Assessment:    1. ST elevation myocardial infarction (STEMI), subsequent episode of care (HCC)   2. Coronary artery disease involving native coronary artery of native heart without angina pectoris   3. Ischemic cardiomyopathy   4. Hyperlipidemia LDL goal <70      Plan:   In order of problems listed above:  1. Subsequent Episode of Care for STEMI/CAD - recently admitted for an anterolateral STEMI and cath showed severe thrombotic occlusion of the proximal LAD which was treated with PCI/DES. Also noted to have residual 75% Ost RI stenosis, 50% mid-LAD, and 40% stenosis along RCA. - Reports that his main presenting symptom was bilateral shoulder pain and he denies any recurrent symptoms since discharge. He has been walking for over 2 miles per day without any recurrent anginal symptoms. We reviewed ways to gradually increase his activity level at home. Also encouraged him to participate in cardiac rehab.   - Continue ASA, Brilinta, beta-blocker, and statin therapy. He has been having intermittent episodes of  dyspnea at rest and it is unclear if this is related to Brilinta. If symptoms do not improve within the coming weeks, could consider switching from Brilinta to Plavix in the future.    2. Ischemic Cardiomyopathy - EF reduced to 35 to 40% by recent echocardiogram as outlined above. He denies any recent orthopnea, PND, or lower extremity edema and appears euvolemic by examination today. - He is currently on Carvedilol 6.25 mg twice daily, Losartan 25 mg daily, and Spironolactone 12.5 mg daily. Will plan to titrate Spironolactone to 25 mg daily and repeat a BMET in 2 to 3 weeks. Cannot further titrate Coreg at this time as his heart rate is in the 50's. I have encouraged him to follow his BP in the ambulatory setting with these changes. He will need a repeat echocardiogram in 2 to 3 months following optimization of his medication regimen.   3. HLD - FLP during recent admission showed total cholesterol 200, triglycerides 163, HDL 60, and LDL 107. He was started on Atorvastatin 80 mg daily during admission given goal LDL less than 70. He will need repeat FLP's and LFTs in 6 to 8 weeks. This was previously followed by his PCP and if not obtained by the time of his next office visit, would repeat.   Medication Adjustments/Labs and Tests Ordered: Current medicines are reviewed at length with the patient today.  Concerns regarding medicines are outlined above.  Medication changes, Labs and Tests ordered today are listed in the Patient Instructions below. Patient Instructions  Medication Instructions:  Your physician has recommended you make the following change in your medication:  Spironolactone 25 mg   If you need a refill on your cardiac medications before your next appointment, please call your pharmacy.   Lab work: Your physician recommends that you return for lab work in: 4 Weeks (01/29/18)   If you have labs (blood work) drawn today  and your tests are completely normal, you will receive your  results only by: Marland Kitchen MyChart Message (if you have MyChart) OR . A paper copy in the mail If you have any lab test that is abnormal or we need to change your treatment, we will call you to review the results.  Testing/Procedures: NONE   Follow-Up: At [facility], you and your health needs are our priority.  As part of our continuing mission to provide you with exceptional heart care, we have created designated Provider Care Teams.  These Care Teams include your primary Cardiologist (physician) and Advanced Practice Providers (APPs -  Physician Assistants and Nurse Practitioners) who all work together to provide you with the care you need, when you need it. You will need a follow up appointment in 2 months.  Please call our office 2 months in advance to schedule this appointment.  You may see No primary care provider on file. or one of the following Advanced Practice Providers on your designated Care Team:   [place], PA-C [facility]) . Jacolyn Reedy, PA-C [facility] Office)  Any Other Special Instructions Will Be Listed Below (If Applicable). Thank you for choosing [facility]!     Signed, Ellsworth Lennox, PA-C  01/01/2018 4:50 PM    [facility] [address] [place], [place] [address] Phone: [phone]

## 2018-01-01 ENCOUNTER — Ambulatory Visit (INDEPENDENT_AMBULATORY_CARE_PROVIDER_SITE_OTHER): Payer: Medicare Other | Admitting: Student

## 2018-01-01 ENCOUNTER — Encounter: Payer: Self-pay | Admitting: Student

## 2018-01-01 VITALS — BP 138/72 | HR 55 | Ht 72.0 in | Wt 201.6 lb

## 2018-01-01 DIAGNOSIS — I2510 Atherosclerotic heart disease of native coronary artery without angina pectoris: Secondary | ICD-10-CM

## 2018-01-01 DIAGNOSIS — E785 Hyperlipidemia, unspecified: Secondary | ICD-10-CM

## 2018-01-01 DIAGNOSIS — I213 ST elevation (STEMI) myocardial infarction of unspecified site: Secondary | ICD-10-CM

## 2018-01-01 DIAGNOSIS — I255 Ischemic cardiomyopathy: Secondary | ICD-10-CM

## 2018-01-01 MED ORDER — ATORVASTATIN CALCIUM 80 MG PO TABS: 80.0000 mg | ORAL_TABLET | Freq: Every day | ORAL | 3 refills | Status: DC

## 2018-01-01 MED ORDER — CARVEDILOL 6.25 MG PO TABS: 6.2500 mg | ORAL_TABLET | Freq: Two times a day (BID) | ORAL | 3 refills | Status: DC

## 2018-01-01 MED ORDER — LOSARTAN POTASSIUM 25 MG PO TABS: 25.0000 mg | ORAL_TABLET | Freq: Every day | ORAL | 3 refills | Status: DC

## 2018-01-01 MED ORDER — SPIRONOLACTONE 25 MG PO TABS: 25.0000 mg | ORAL_TABLET | Freq: Every day | ORAL | 3 refills | Status: DC

## 2018-01-01 MED ORDER — TICAGRELOR 90 MG PO TABS: 90.0000 mg | ORAL_TABLET | Freq: Two times a day (BID) | ORAL | 3 refills | Status: DC

## 2018-01-01 NOTE — Patient Instructions (Signed)
Medication Instructions:  Your physician has recommended you make the following change in your medication:  Spironolactone 25 mg   If you need a refill on your cardiac medications before your next appointment, please call your pharmacy.   Lab work: Your physician recommends that you return for lab work in: 4 Weeks (01/29/18)   If you have labs (blood work) drawn today and your tests are completely normal, you will receive your results only by: Marland Kitchen MyChart Message (if you have MyChart) OR . A paper copy in the mail If you have any lab test that is abnormal or we need to change your treatment, we will call you to review the results.  Testing/Procedures: NONE   Follow-Up: At [facility], you and your health needs are our priority.  As part of our continuing mission to provide you with exceptional heart care, we have created designated Provider Care Teams.  These Care Teams include your primary Cardiologist (physician) and Advanced Practice Providers (APPs -  Physician Assistants and Nurse Practitioners) who all work together to provide you with the care you need, when you need it. You will need a follow up appointment in 2 months.  Please call our office 2 months in advance to schedule this appointment.  You may see No primary care provider on file. or one of the following Advanced Practice Providers on your designated Care Team:   [place], PA-C [facility]) . Jacolyn Reedy, PA-C [facility] Office)  Any Other Special Instructions Will Be Listed Below (If Applicable). Thank you for choosing [facility]!

## 2018-01-29 ENCOUNTER — Other Ambulatory Visit (HOSPITAL_COMMUNITY)
Admission: RE | Admit: 2018-01-29 | Discharge: 2018-01-29 | Disposition: A | Discharge status: Home / Self Care | Payer: Medicare Other | Source: Ambulatory Visit | Attending: Student | Admitting: Student

## 2018-01-29 ENCOUNTER — Telehealth: Payer: Self-pay

## 2018-01-29 DIAGNOSIS — I2510 Atherosclerotic heart disease of native coronary artery without angina pectoris: Secondary | ICD-10-CM | POA: Diagnosis present

## 2018-01-29 DIAGNOSIS — I255 Ischemic cardiomyopathy: Secondary | ICD-10-CM | POA: Diagnosis present

## 2018-01-29 LAB — BASIC METABOLIC PANEL
ANION GAP: 6 (ref 5–15)
BUN: 16 mg/dL (ref 8–23)
CHLORIDE: 106 mmol/L (ref 98–111)
CO2: 27 mmol/L (ref 22–32)
Calcium: 9.5 mg/dL (ref 8.9–10.3)
Creatinine, Ser: 1.02 mg/dL (ref 0.61–1.24)
GFR calc Af Amer: >60 mL/min (ref >60)
GFR calc non Af Amer: >60 mL/min (ref >60)
GLUCOSE: 117 mg/dL — AB (ref 70–99)
POTASSIUM: 4.6 mmol/L (ref 3.5–5.1)
Sodium: 139 mmol/L (ref 135–145)

## 2018-01-29 NOTE — Telephone Encounter (Signed)
Called pt. No answer. Left detailed message on private voicemail.  

## 2018-01-29 NOTE — Telephone Encounter (Signed)
-----   Message from Ellsworth Lennox, [place] sent at 01/29/2018  9:24 AM EST ----- Please let the patient know that his electrolytes and kidney function remain stable. Continue current medication regimen. Please forward a copy of labs to [name], MD. Thank you.

## 2018-03-19 ENCOUNTER — Encounter: Payer: Self-pay | Admitting: Cardiology

## 2018-03-19 ENCOUNTER — Ambulatory Visit (INDEPENDENT_AMBULATORY_CARE_PROVIDER_SITE_OTHER): Payer: Medicare Other | Admitting: Cardiology

## 2018-03-19 VITALS — BP 130/70 | HR 68 | Ht 72.0 in | Wt 200.0 lb

## 2018-03-19 DIAGNOSIS — I255 Ischemic cardiomyopathy: Secondary | ICD-10-CM

## 2018-03-19 DIAGNOSIS — I25119 Atherosclerotic heart disease of native coronary artery with unspecified angina pectoris: Secondary | ICD-10-CM | POA: Diagnosis not present

## 2018-03-19 DIAGNOSIS — E782 Mixed hyperlipidemia: Secondary | ICD-10-CM

## 2018-03-19 DIAGNOSIS — I10 Essential (primary) hypertension: Secondary | ICD-10-CM

## 2018-03-19 NOTE — Progress Notes (Signed)
Cardiology Office Note  Date: 03/19/2018   ID: Thomas Black., DOB October 28, 1940, MRN [id number]  PCP: Thomas Perches, MD  Primary Cardiologist: Thomas Dell, MD   Chief Complaint  Patient presents with  . Coronary Artery Disease    History of Present Illness: Thomas Black. is a 78 y.o. male that I am meeting for the first time today.  I reviewed records and updated the chart.  He was last seen in the office by Ms. Strader PA-C in November 2019.  He is here with his wife today for follow-up.  He states that he has been feeling well, no angina symptoms, NYHA class I-II dyspnea.  He is walking anywhere from 3 to 5 miles on 5 days of the week.  He has felt better stamina since hospital discharge.  Follow-up LDL in December 2019 was 34.  He is tolerating high-dose Lipitor.  We discussed obtaining a follow-up echocardiogram in comparison to the previous study from October 2019 at which point LVEF was in the 35 to 40% range.  Reviewed his medications, he continues on aspirin and Brilinta, has had some mild shortness of breath with Brilinta but otherwise tolerating reasonably well.  I talked with him today about realistic exercise goals.  He is somewhat of the mindset that if he is not "pushing it hard" he is not getting any benefit.  We went over the merits of a regular, moderate intensity exercise program which he is already achieving.  Past Medical History:  Diagnosis Date  . Chronic neck pain   . Coronary artery disease    a. 11/2017: s/p anterolateral STEMI with cath showing severe thrombotic occlusion of proximal-LAD --> PCI/DES performed  . Headache(784.0)   . Hypercholesteremia   . Hypertension   . Ischemic cardiomyopathy    a. 11/2017: EF 35% to 40% with akinesis of the anteroseptal and apical myocardium noted  . Migraine   . Muscle spasms of neck   . Myocardial infarction [facility])     Past Surgical History:  Procedure Laterality Date  . CHOLECYSTECTOMY     1/12  .  COLONOSCOPY  03/15/2011   Procedure: COLONOSCOPY;  Surgeon: [name], MD;  Location: [facility];  Service: Endoscopy;  Laterality: N/A;  9:30 am  . CORONARY STENT INTERVENTION N/A 12/03/2017   Procedure: CORONARY STENT INTERVENTION;  Surgeon: [name], MD;  Location: [facility] INVASIVE CV LAB;  Service: Cardiovascular;  Laterality: N/A;  . ESOPHAGOGASTRODUODENOSCOPY N/A 06/26/2012   Procedure: ESOPHAGOGASTRODUODENOSCOPY (EGD);  Surgeon: [name], MD;  Location: [facility];  Service: Endoscopy;  Laterality: N/A;  . fractured jaw    . fractured nose    . left acl repair    . LEFT HEART CATH AND CORONARY ANGIOGRAPHY N/A 12/03/2017   Procedure: LEFT HEART CATH AND CORONARY ANGIOGRAPHY;  Surgeon: [name], MD;  Location: [facility] INVASIVE CV LAB;  Service: Cardiovascular;  Laterality: N/A;  . Left Knee Arthroscopy    . TIBIA FRACTURE SURGERY      Current Outpatient Medications  Medication Sig Dispense Refill  . acetaminophen (TYLENOL) 325 MG tablet Take 650 mg by mouth every 6 (six) hours as needed for mild pain.    Marland Kitchen aspirin 81 MG EC tablet Take 1 tablet (81 mg total) by mouth daily. 30 tablet 0  . atorvastatin (LIPITOR) 80 MG tablet Take 1 tablet (80 mg total) by mouth daily at 6 PM. 90 tablet 3  . carvedilol (COREG) 6.25 MG tablet  Take 1 tablet (6.25 mg total) by mouth 2 (two) times daily with a meal. 180 tablet 3  . losartan (COZAAR) 25 MG tablet Take 1 tablet (25 mg total) by mouth daily. 90 tablet 3  . Melatonin 5 MG CAPS Take 5 mg by mouth at bedtime.    Marland Kitchen. oxyCODONE-acetaminophen (PERCOCET) 10-325 MG per tablet Take 0.5-1 tablets by mouth daily as needed for pain (for migraine pain). migraines    . ranitidine (ZANTAC) 150 MG tablet Take 150 mg by mouth at bedtime.    Marland Kitchen. spironolactone (ALDACTONE) 25 MG tablet Take 1 tablet (25 mg total) by mouth daily. 90 tablet 3  . ticagrelor (BRILINTA) 90 MG TABS tablet Take 1 tablet (90 mg total) by mouth 2 (two) times daily. 180  tablet 3   No current facility-administered medications for this visit.    Allergies:  Patient has no known allergies.   Social History: The patient  reports that he has never smoked. He has never used smokeless tobacco. He reports that he does not drink alcohol or use drugs.   ROS:  Please see the history of present illness. Otherwise, complete review of systems is positive for none.  All other systems are reviewed and negative.   Physical Exam: VS:  BP 130/70 (BP Location: Right Arm)   Pulse 68   Ht 6' (1.829 m)   Wt 200 lb (90.7 kg)   BMI 27.12 kg/m , BMI Body mass index is 27.12 kg/m.  Wt Readings from Last 3 Encounters:  03/19/18 200 lb (90.7 kg)  01/01/18 201 lb 9.6 oz (91.4 kg)  12/06/17 198 lb 3.1 oz (89.9 kg)    General: Elderly male.  Patient appears comfortable at rest. HEENT: Conjunctiva and lids normal, oropharynx clear. Neck: Supple, no elevated JVP or carotid bruits, no thyromegaly. Lungs: Clear to auscultation, nonlabored breathing at rest. Cardiac: Regular rate and rhythm, no S3 or significant systolic murmur, no pericardial rub. Abdomen: Soft, nontender, bowel sounds present, no guarding or rebound. Extremities: No pitting edema, distal pulses 2+. Skin: Warm and dry. Musculoskeletal: No kyphosis. Neuropsychiatric: Alert and oriented x3, affect grossly appropriate.  ECG: I personally reviewed the tracing from 01/01/2018 which showed sinus rhythm with low voltage and old anterior infarct pattern.  Recent Labwork: 12/03/2017: ALT 25; AST 38 12/04/2017: Hemoglobin 16.2; Platelets 279 01/29/2018: BUN 16; Creatinine, Ser 1.02; Potassium 4.6; Sodium 139     Component Value Date/Time   CHOL 200 12/04/2017 0437   TRIG 163 (H) 12/04/2017 0437   HDL 60 12/04/2017 0437   CHOLHDL 3.3 12/04/2017 0437   VLDL 33 12/04/2017 0437   LDLCALC 107 (H) 12/04/2017 0437  December 2019: Cholesterol 127, triglycerides 231, HDL 47, LDL 34, ALT 21  Other Studies Reviewed  Today:  Echocardiogram 12/04/2017: Study Conclusions  - Left ventricle: The cavity size was mildly dilated. There was   mild focal basal hypertrophy of the septum. Systolic function was   moderately reduced. The estimated ejection fraction was in the   range of 35% to 40%. There is akinesis of the anteroseptal and   apical myocardium. Doppler parameters are consistent with   abnormal left ventricular relaxation (grade 1 diastolic   dysfunction). - Aortic valve: There was trivial regurgitation.  Impressions:  - Akinesis of the anteroseptal wall and apex with overall moderate   LV dysfunction; mild diastolic dysfunction; trace AI.  Cardiac catheterization 12/03/2017:  Mid RCA lesion is 40% stenosed.  Dist RCA lesion is 40% stenosed.  Ost Ramus  to Ramus lesion is 75% stenosed.  Prox LAD lesion is 95% stenosed.  Mid LAD to Dist LAD lesion is 50% stenosed.  A drug-eluting stent was successfully placed using a STENT RESOLUTE ONYX 3.0X15.  Post intervention, there is a 0% residual stenosis.  There is moderate to severe left ventricular systolic dysfunction.  LV end diastolic pressure is moderately elevated.   1.  Severe thrombotic proximal LAD stenosis, treated successfully with PCI using a drug-eluting stent 2.  Moderately severe stenosis of the ramus intermedius 3.  Mild to moderate RCA stenosis 4.  Widely patent left circumflex 5.  Moderately severe LV systolic dysfunction with LVEF estimated at 35% and severely elevated LVEDP  Recommend uninterrupted dual antiplatelet therapy with Aspirin 81mg  daily and Ticagrelor 90mg  twice daily for a minimum of 12 months (ACS - Class I recommendation).   Initial presentation consistent with acute systolic heart failure with LAD territory infarction, severe LV dysfunction, and severely elevated LVEDP. The patient will be given IV furosemide in the Cath Lab in addition to routine post MI medical therapy.  He will be monitored carefully  in the CV-ICU.  Assessment and Plan:  1.  CAD status post anterolateral STEMI in October 2019 managed with DES to the proximal LAD.  LVEF was 35 to 40% at that time.  He continues on dual antiplatelet therapy, I discussed with him the importance of uninterrupted treatment for 1 year.  We will obtain a follow-up echocardiogram to reassess LVEF.  He is tolerating current medical regimen well and is exercising appropriately.  2.  Mixed hyperlipidemia, doing well on high-dose Lipitor with follow-up LDL down to 34.  3.  Ischemic cardiomyopathy, reassessing by echocardiogram as discussed above.  No evidence of fluid overload.  4.  Essential hypertension, blood pressure is adequately controlled today.  No changes were made as yet.  Current medicines were reviewed with the patient today.   Orders Placed This Encounter  Procedures  . ECHOCARDIOGRAM COMPLETE    Disposition: Follow-up in 6 months.  Signed, Jonelle Sidle, MD, [facility] 03/19/2018 4:08 PM    [facility] at [facility] [address], [place], [place] [address] Phone: [phone]; Fax: [phone]

## 2018-03-19 NOTE — Patient Instructions (Signed)
Medication Instructions:  Your physician recommends that you continue on your current medications as directed. Please refer to the Current Medication list given to you today.  If you need a refill on your cardiac medications before your next appointment, please call your pharmacy.   Lab work: None today If you have labs (blood work) drawn today and your tests are completely normal, you will receive your results only by: . MyChart Message (if you have MyChart) OR . A paper copy in the mail If you have any lab test that is abnormal or we need to change your treatment, we will call you to review the results.  Testing/Procedures: Your physician has requested that you have an echocardiogram. Echocardiography is a painless test that uses sound waves to create images of your heart. It provides your doctor with information about the size and shape of your heart and how well your heart's chambers and valves are working. This procedure takes approximately one hour. There are no restrictions for this procedure.    Follow-Up: At [facility], you and your health needs are our priority.  As part of our continuing mission to provide you with exceptional heart care, we have created designated Provider Care Teams.  These Care Teams include your primary Cardiologist (physician) and Advanced Practice Providers (APPs -  Physician Assistants and Nurse Practitioners) who all work together to provide you with the care you need, when you need it. You will need a follow up appointment in 6 months.  Please call our office 2 months in advance to schedule this appointment.  You may see Samuel McDowell, MD or one of the following Advanced Practice Providers on your designated Care Team:   Brittany Strader, PA-C ([place] Office) . Michele Lenze, PA-C ([place] Office)  Any Other Special Instructions Will Be Listed Below (If Applicable). NONE   

## 2018-03-22 ENCOUNTER — Telehealth: Payer: Self-pay

## 2018-03-22 ENCOUNTER — Ambulatory Visit (HOSPITAL_COMMUNITY)
Admission: RE | Admit: 2018-03-22 | Discharge: 2018-03-22 | Disposition: A | Discharge status: Home / Self Care | Payer: Medicare Other | Source: Ambulatory Visit | Attending: Cardiology | Admitting: Cardiology

## 2018-03-22 DIAGNOSIS — I255 Ischemic cardiomyopathy: Secondary | ICD-10-CM

## 2018-03-22 DIAGNOSIS — Z79899 Other long term (current) drug therapy: Secondary | ICD-10-CM

## 2018-03-22 MED ORDER — SACUBITRIL-VALSARTAN 24-26 MG PO TABS: 1.0000 | ORAL_TABLET | Freq: Two times a day (BID) | ORAL | 11 refills | Status: DC

## 2018-03-22 NOTE — Telephone Encounter (Signed)
-----  Message from Satira Sark, MD sent at 03/22/2018  3:07 PM EST ----- Results reviewed.  LVEF remains reduced, 30 to 35% range by follow-up study.  Although he was doing well symptomatically when I met him recently, this still would warrant medication adjustments.  He is on Cozaar, would suggest switching to Entresto 24/26 mg twice daily follow-up BMET in 2 weeks.  Let us schedule him a closer follow-up visit with Brittney in the next 8 weeks. A copy of this test should be forwarded to Asencion Noble, MD.

## 2018-03-22 NOTE — Telephone Encounter (Signed)
Pt will switch from losartan to Entresto 24/26 mg  Twice a day   Free 30 day trail sent to [facility] lab slip for bmet in 2 weeks   F/u apt 05/17/2018 at 1 pm with B Strader PA-C

## 2018-03-22 NOTE — Progress Notes (Signed)
*  PRELIMINARY RESULTS* Echocardiogram 2D Echocardiogram has been performed.  Jeryl Columbia 03/22/2018, 2:38 PM

## 2018-03-22 NOTE — Telephone Encounter (Signed)
[id number] 

## 2018-04-05 ENCOUNTER — Other Ambulatory Visit (HOSPITAL_COMMUNITY)
Admission: RE | Admit: 2018-04-05 | Discharge: 2018-04-05 | Disposition: A | Discharge status: Home / Self Care | Payer: Medicare Other | Source: Ambulatory Visit | Attending: Cardiology | Admitting: Cardiology

## 2018-04-05 DIAGNOSIS — Z79899 Other long term (current) drug therapy: Secondary | ICD-10-CM | POA: Diagnosis present

## 2018-04-05 LAB — BASIC METABOLIC PANEL
Anion gap: 7 (ref 5–15)
BUN: 15 mg/dL (ref 8–23)
CHLORIDE: 104 mmol/L (ref 98–111)
CO2: 28 mmol/L (ref 22–32)
CREATININE: 0.94 mg/dL (ref 0.61–1.24)
Calcium: 9.6 mg/dL (ref 8.9–10.3)
GFR calc Af Amer: >60 mL/min (ref >60)
GFR calc non Af Amer: >60 mL/min (ref >60)
Glucose, Bld: 103 mg/dL — ABNORMAL HIGH (ref 70–99)
POTASSIUM: 4.3 mmol/L (ref 3.5–5.1)
SODIUM: 139 mmol/L (ref 135–145)

## 2018-05-08 IMAGING — DX DG CHEST 2V
3 series · 3 of 3 positions shown · non-contrast
Comparison: 05/12/2010

CLINICAL DATA: LEFT side chest pain radiating to shoulder blade,
shortness of breath, productive cough for 5 days

EXAM:
CHEST  2 VIEW

[chest pa]
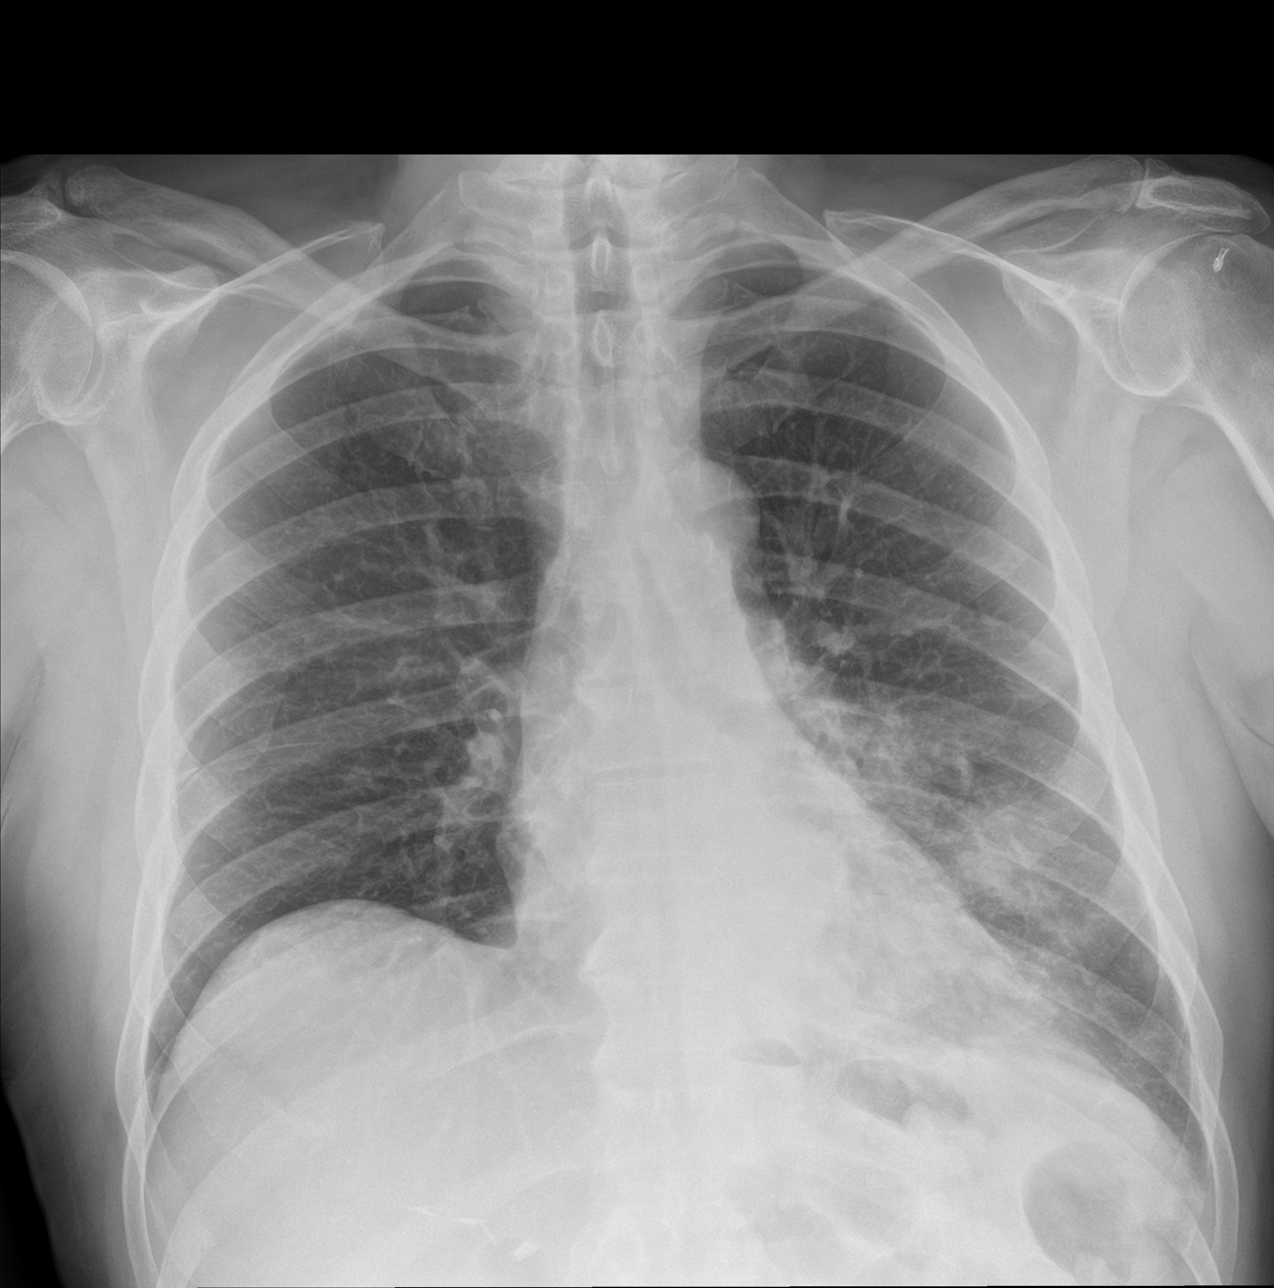

[chest lat (1 of 2)]
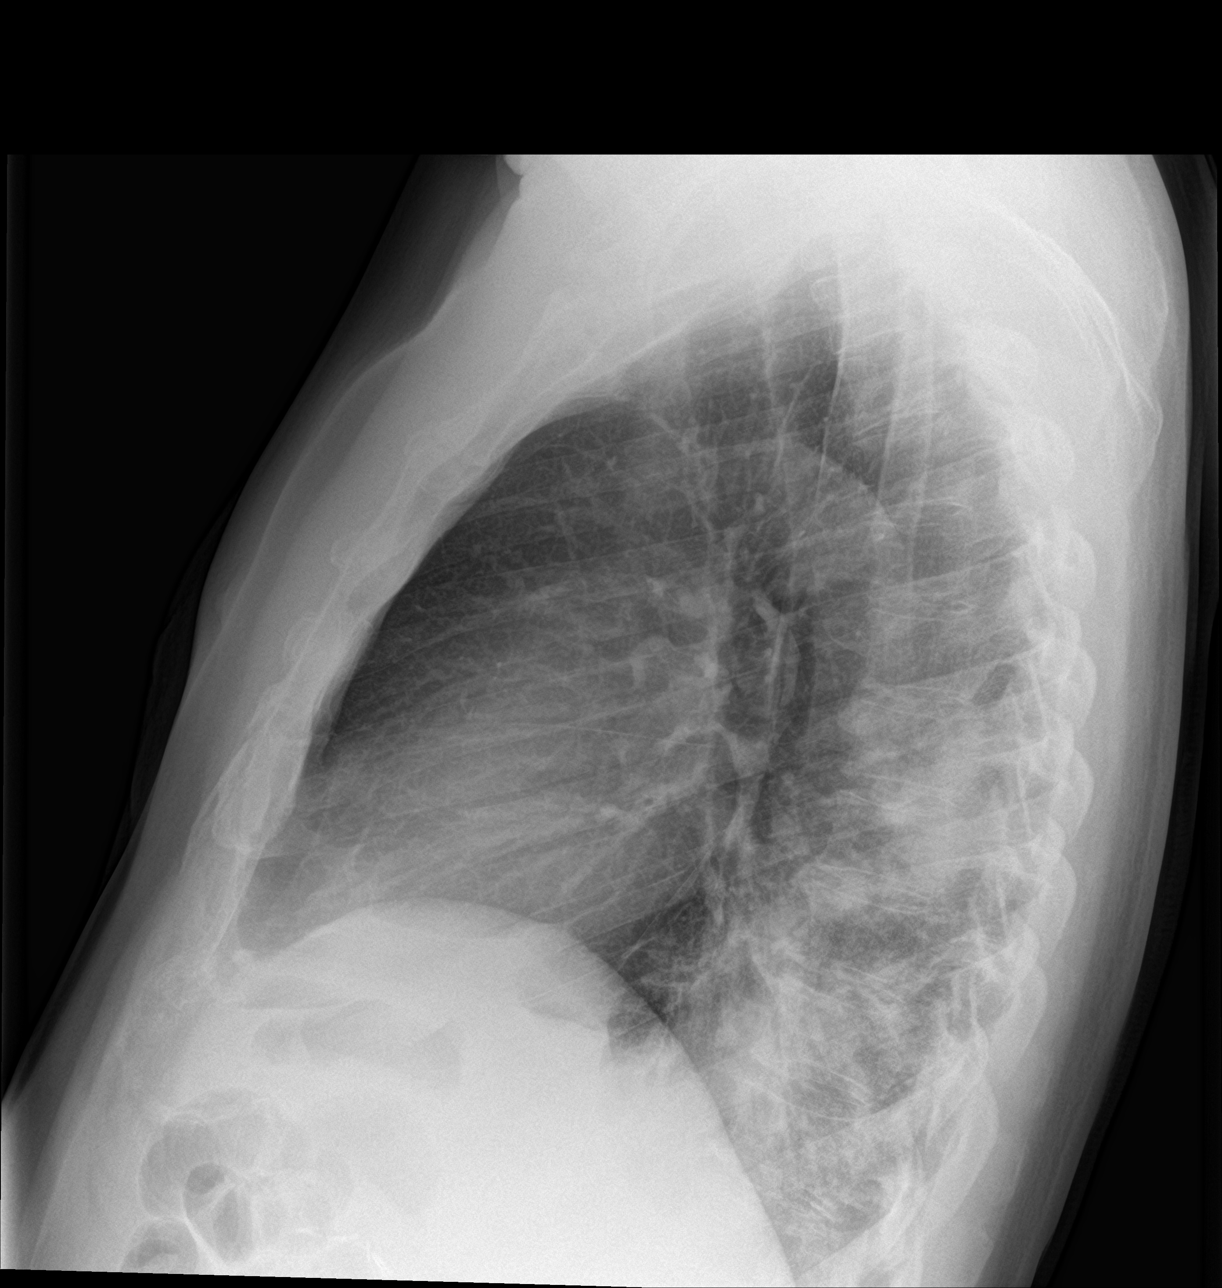

[chest lat (2 of 2)]
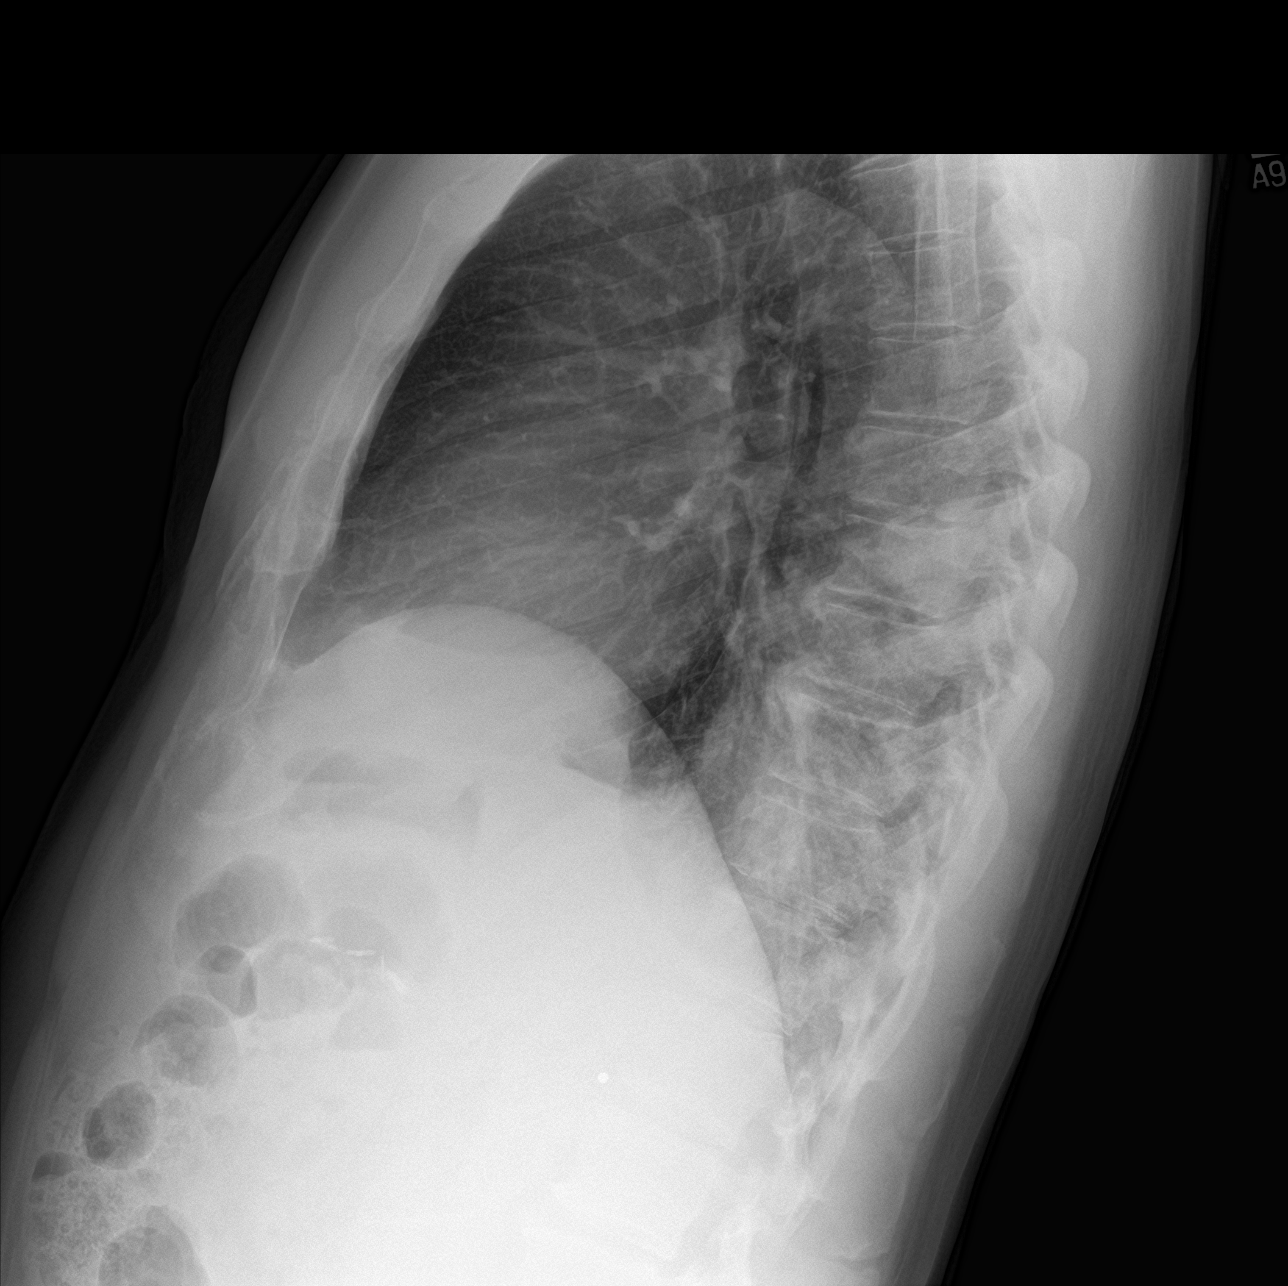

[3 of 3 positions shown; findings below may reference images not displayed]

FINDINGS: Normal heart size, mediastinal contours, and pulmonary vascularity.

LEFT lower lobe infiltrate consistent with pneumonia.

Remaining lungs clear.

No pleural effusion or pneumothorax.

Bones unremarkable.
IMPRESSION: LEFT lower lobe pneumonia.

Followup PA and lateral chest X-ray is recommended in 3-4 weeks
following trial of antibiotic therapy to ensure resolution and
exclude underlying malignancy.

## 2018-05-16 ENCOUNTER — Telehealth: Payer: Self-pay | Admitting: Student

## 2018-05-16 NOTE — Telephone Encounter (Signed)
   Cardiac Questionnaire:  Since your last visit or hospitalization:    1. Have you been having new or worsening chest pain? No   2. Have you been having new or worsening shortness of breath? No 3. Have you been having new or worsening leg swelling, wt gain, or increase in abdominal girth (pants fitting more tightly)? No   4. Have you had any passing out spells? No    A YES to any of these questions would result in the appointment being kept. If all the answers to these questions are NO, we should indicate that given the current situation regarding the worldwide coronarvirus pandemic, at the recommendation of the CDC, we are looking to limit gatherings in our waiting area, and thus will reschedule their appointment beyond four weeks from today.   _____________    Jeanene Erb and spoke with patient. No recent symptoms. Does report having a tooth abscess which might require extraction. He is s/p STEMI in 11/2017 and is to be on DAPT for a minimum of 12 months. Reviewed that Brilinta could likely be held for 4-5 days in 05/2018 as he would be 6 months out from his procedure but would recommend continuing on ASA. Preferably, if just a single extraction, hopefully this would not need to be held.   Can cancel visit for later today and reschedule in 4-6 weeks.   Signed, Ellsworth Lennox, PA-C 05/17/2018, 10:01 AM Pager: [phone]

## 2018-05-17 ENCOUNTER — Ambulatory Visit: Payer: Medicare Other | Admitting: Student

## 2018-07-03 ENCOUNTER — Telehealth: Payer: Self-pay | Admitting: Student

## 2018-07-03 NOTE — Telephone Encounter (Signed)
Virtual Visit Pre-Appointment Phone Call  "(Name), I am calling you today to discuss your upcoming appointment. We are currently trying to limit exposure to the virus that causes COVID-19 by seeing patients at home rather than in the office."  1. "What is the BEST phone number to call the day of the visit?" - include this in appointment notes  2. Do you have or have access to (through a family member/friend) a smartphone with video capability that we can use for your visit?" a. If yes - list this number in appt notes as cell (if different from BEST phone #) and list the appointment type as a VIDEO visit in appointment notes b. If no - list the appointment type as a PHONE visit in appointment notes  3. Confirm consent - "In the setting of the current Covid19 crisis, you are scheduled for a (phone or video) visit with your provider on (date) at (time).  Just as we do with many in-office visits, in order for you to participate in this visit, we must obtain consent.  If you'd like, I can send this to your mychart (if signed up) or email for you to review.  Otherwise, I can obtain your verbal consent now.  All virtual visits are billed to your insurance company just like a normal visit would be.  By agreeing to a virtual visit, we'd like you to understand that the technology does not allow for your provider to perform an examination, and thus may limit your provider's ability to fully assess your condition. If your provider identifies any concerns that need to be evaluated in person, we will make arrangements to do so.  Finally, though the technology is pretty good, we cannot assure that it will always work on either your or our end, and in the setting of a video visit, we may have to convert it to a phone-only visit.  In either situation, we cannot ensure that we have a secure connection.  Are you willing to proceed?" STAFF: Did the patient verbally acknowledge consent to telehealth visit? Document  YES/NO here: Yes  4. Advise patient to be prepared - "Two hours prior to your appointment, go ahead and check your blood pressure, pulse, oxygen saturation, and your weight (if you have the equipment to check those) and write them all down. When your visit starts, your provider will ask you for this information. If you have an Apple Watch or Kardia device, please plan to have heart rate information ready on the day of your appointment. Please have a pen and paper handy nearby the day of the visit as well."  5. Give patient instructions for MyChart download to smartphone OR [email or web address] as below if video visit (depending on what platform provider is using)  6. Inform patient they will receive a phone call 15 minutes prior to their appointment time (may be from unknown caller ID) so they should be prepared to answer    TELEPHONE CALL NOTE  Thomas Black. has been deemed a candidate for a follow-up tele-health visit to limit community exposure during the Covid-19 pandemic. I spoke with the patient via phone to ensure availability of phone/video source, confirm preferred email & phone number, and discuss instructions and expectations.  I reminded Thomas Black. to be prepared with any vital sign and/or heart rhythm information that could potentially be obtained via home monitoring, at the time of his visit. I reminded Thomas Black. to expect a phone  call prior to his visit.  Thomas Black 07/03/2018 12:25 PM

## 2018-07-10 ENCOUNTER — Telehealth (INDEPENDENT_AMBULATORY_CARE_PROVIDER_SITE_OTHER): Payer: Medicare Other | Admitting: Student

## 2018-07-10 ENCOUNTER — Encounter: Payer: Self-pay | Admitting: Student

## 2018-07-10 VITALS — HR 60 | Wt 194.0 lb

## 2018-07-10 DIAGNOSIS — E785 Hyperlipidemia, unspecified: Secondary | ICD-10-CM

## 2018-07-10 DIAGNOSIS — I10 Essential (primary) hypertension: Secondary | ICD-10-CM

## 2018-07-10 DIAGNOSIS — I255 Ischemic cardiomyopathy: Secondary | ICD-10-CM

## 2018-07-10 DIAGNOSIS — Z7189 Other specified counseling: Secondary | ICD-10-CM

## 2018-07-10 DIAGNOSIS — I2510 Atherosclerotic heart disease of native coronary artery without angina pectoris: Secondary | ICD-10-CM

## 2018-07-10 NOTE — Progress Notes (Signed)
Virtual Visit via Telephone Note   This visit type was conducted due to national recommendations for restrictions regarding the COVID-19 Pandemic (e.g. social distancing) in an effort to limit this patient's exposure and mitigate transmission in our community.  Due to his co-morbid illnesses, this patient is at least at moderate risk for complications without adequate follow up.  This format is felt to be most appropriate for this patient at this time.  The patient did not have access to video technology/had technical difficulties with video requiring transitioning to audio format only (telephone).  All issues noted in this document were discussed and addressed.  No physical exam could be performed with this format.  Please refer to the patient's chart for his  consent to telehealth for [facility].   Date:  07/10/2018   ID:  Thomas Black., DOB 01-11-1941, MRN [id number]  Patient Location: Home Provider Location: Home  PCP:  [name], MD  Cardiologist:  [name], MD  Electrophysiologist:  None   Evaluation Performed:  Follow-Up Visit  Chief Complaint:  10-month visit  History of Present Illness:    Thomas Black. is a 78 y.o. male with past medical history of CAD (s/p STEMI in 11/2017 with DES to proximal-LAD), chronic combined systolic and diastolic CHF (EF 08-65% by echo in 11/2017, at 30-35% by repeat imaging in 02/2018), ischemic cardiomyopathy, HTN, and HLD who presents for a follow-up visit.   He was last examined by Dr. Diona Browner in 02/2018 and denied any recent chest pain or dyspnea at that time. Was walking 3 to 5 miles per day several days per week without recurrent anginal symptoms. Was continued on his current regimen and a follow-up echo was obtained. This showed his EF remained reduced at 30-35% with akinesis of the mid-apical anterior, mid anteroseptal, apical inferior, mid anterolateral, apical septal, apical lateral, and apical myocardium. He was switched  from Losartan to Entresto 24-26mg  BID. Follow-up BMET showed stable renal function and electrolytes.   In talking with the patient today, he reports overall doing well from a cardiac perspective since his last office visit. He has been cutting firewood for the past several weeks without any anginal symptoms. The patient and his wife also go for a 3 mile walk at least 5 days/week and he denies any symptoms with this. No recent orthopnea, PND, or lower extremity edema. Weight has overall been stable on his home scales and has actually declined by 5 pounds since 02/2018.  He reports good compliance with his current medication regimen. Does report having intermittent episodes of dyspnea at rest but does not feel like this correlates with when he takes Brilinta. He also reports having issues with erectile dysfunction since his MI in 11/2017. He questions if this is due to one of his medications.  The patient does not have symptoms concerning for COVID-19 infection (fever, chills, cough, or new shortness of breath).    Past Medical History:  Diagnosis Date   Chronic neck pain    Coronary artery disease    a. 11/2017: s/p anterolateral STEMI with cath showing severe thrombotic occlusion of proximal-LAD --> PCI/DES performed   Headache(784.0)    Hypercholesteremia    Hypertension    Ischemic cardiomyopathy    a. 11/2017: EF 35% to 40% with akinesis of the anteroseptal and apical myocardium noted   Migraine    Muscle spasms of neck    Myocardial infarction [facility])    Past Surgical History:  Procedure Laterality Date  CHOLECYSTECTOMY     1/12   COLONOSCOPY  03/15/2011   Procedure: COLONOSCOPY;  Surgeon: [name], MD;  [facility];  Service: Endoscopy;  Laterality: N/A;  9:30 am   CORONARY STENT INTERVENTION N/A 12/03/2017   Procedure: CORONARY STENT INTERVENTION;  Surgeon: [name], MD;  Location: [facility] INVASIVE CV LAB;  Service: Cardiovascular;  Laterality: N/A;     ESOPHAGOGASTRODUODENOSCOPY N/A 06/26/2012   Procedure: ESOPHAGOGASTRODUODENOSCOPY (EGD);  Surgeon: [name], MD;  [facility];  Service: Endoscopy;  Laterality: N/A;   fractured jaw     fractured nose     left acl repair     LEFT HEART CATH AND CORONARY ANGIOGRAPHY N/A 12/03/2017   Procedure: LEFT HEART CATH AND CORONARY ANGIOGRAPHY;  Surgeon: [name], MD;  Location: [facility] INVASIVE CV LAB;  Service: Cardiovascular;  Laterality: N/A;   Left Knee Arthroscopy     TIBIA FRACTURE SURGERY       Current Meds  Medication Sig   acetaminophen (TYLENOL) 325 MG tablet Take 650 mg by mouth every 6 (six) hours as needed for mild pain.   aspirin 81 MG EC tablet Take 1 tablet (81 mg total) by mouth daily.   atorvastatin (LIPITOR) 80 MG tablet Take 1 tablet (80 mg total) by mouth daily at 6 PM.   carvedilol (COREG) 6.25 MG tablet Take 1 tablet (6.25 mg total) by mouth 2 (two) times daily with a meal.   cyclobenzaprine (FLEXERIL) 10 MG tablet Take by mouth.   Melatonin 5 MG CAPS Take 5 mg by mouth at bedtime.   oxyCODONE-acetaminophen (PERCOCET) 10-325 MG per tablet Take 0.5-1 tablets by mouth daily as needed for pain (for migraine pain). migraines   sacubitril-valsartan (ENTRESTO) 24-26 MG Take 1 tablet by mouth 2 (two) times daily.   spironolactone (ALDACTONE) 25 MG tablet Take 1 tablet (25 mg total) by mouth daily.   ticagrelor (BRILINTA) 90 MG TABS tablet Take 1 tablet (90 mg total) by mouth 2 (two) times daily.     Allergies:   Patient has no known allergies.   Social History   Tobacco Use   Smoking status: Never Smoker   Smokeless tobacco: Never Used  Substance Use Topics   Alcohol use: No   Drug use: No     Family Hx: The patient's family history includes Cancer in his mother. There is no history of Colon cancer or Heart disease.  ROS:   Please see the history of present illness.     All other systems reviewed and are negative.   Prior  CV studies:   The following studies were reviewed today:  Cardiac Catheterization: 11/2017  Mid RCA lesion is 40% stenosed.  Dist RCA lesion is 40% stenosed.  Ost Ramus to Ramus lesion is 75% stenosed.  Prox LAD lesion is 95% stenosed.  Mid LAD to Dist LAD lesion is 50% stenosed.  A drug-eluting stent was successfully placed using a STENT RESOLUTE ONYX 3.0X15.  Post intervention, there is a 0% residual stenosis.  There is moderate to severe left ventricular systolic dysfunction.  LV end diastolic pressure is moderately elevated.   1.  Severe thrombotic proximal LAD stenosis, treated successfully with PCI using a drug-eluting stent 2.  Moderately severe stenosis of the ramus intermedius 3.  Mild to moderate RCA stenosis 4.  Widely patent left circumflex 5.  Moderately severe LV systolic dysfunction with LVEF estimated at 35% and severely elevated LVEDP  Recommend uninterrupted dual antiplatelet therapy with Aspirin  81mg  daily and Ticagrelor 90mg  twice daily for a minimum of 12 months (ACS - Class I recommendation).   Initial presentation consistent with acute systolic heart failure with LAD territory infarction, severe LV dysfunction, and severely elevated LVEDP.  The patient will be given IV furosemide in the Cath Lab in addition to routine post MI medical therapy.  He will be monitored carefully in the CV-ICU.   Echocardiogram: 02/2018 Study Conclusions  - Left ventricle: The cavity size was normal. Wall thickness was   increased in a pattern of moderate LVH. Systolic function was   moderately to severely reduced. The estimated ejection fraction   was in the range of 30% to 35%. Doppler parameters are consistent   with abnormal left ventricular relaxation (grade 1 diastolic   dysfunction). - Regional wall motion abnormality: Akinesis of the mid-apical   anterior, mid anteroseptal, apical inferior, mid anterolateral,   apical septal, apical lateral, and apical  myocardium. - Aortic valve: Valve area (VTI): 3.04 cm^2. Valve area (Vmax):   3.16 cm^2.   Labs/Other Tests and Data Reviewed:    EKG:  No ECG reviewed.  Recent Labs: 12/03/2017: ALT 25 12/04/2017: Hemoglobin 16.2; Platelets 279 04/05/2018: BUN 15; Creatinine, Ser 0.94; Potassium 4.3; Sodium 139   Recent Lipid Panel Lab Results  Component Value Date/Time   CHOL 200 12/04/2017 04:37 AM   TRIG 163 (H) 12/04/2017 04:37 AM   HDL 60 12/04/2017 04:37 AM   CHOLHDL 3.3 12/04/2017 04:37 AM   LDLCALC 107 (H) 12/04/2017 04:37 AM    Wt Readings from Last 3 Encounters:  07/10/18 194 lb (88 kg)  03/19/18 200 lb (90.7 kg)  01/01/18 201 lb 9.6 oz (91.4 kg)     Objective:    Vital Signs:  Pulse 60    Wt 194 lb (88 kg)    BMI 26.31 kg/m    General: Pleasant male sounding in NAD Psych: Normal affect. Neuro: Alert and oriented X 3.  Lungs:  Resp regular and unlabored while talking on the phone.   ASSESSMENT & PLAN:    1. CAD - he is s/p STEMI in 11/2017 with DES to proximal-LAD. Has residual disease along the RI, distal LAD, and RCA as outlined above. He has been cutting firewood and walking for 3 miles most days of the week without any anginal symptoms.  - continue DAPT with ASA and Brilinita. We reviewed his intermittent dyspnea at rest might be secondary to [facility] but he wishes to continue on the medication for now as the episodes are infrequent and brief. Remains on BB and statin therapy.   2. Ischemic Cardiomyopathy - EF remains reduced at 35% by most recent echocardiogram with him being switched from Losartan to Entresto at that time.  - he denies any recent orthopnea, PND, or edema and weight has been stable on his home scales.  - continue Coreg 6.25mg  BID, Entresto 24-26mg  BID, and Spironolactone 25mg  dialy. Would not further titrate Entresto at this time as he does not have a BP cuff at home. He does report issues with ED which is possibly secondary to Coreg. Reviewed he could  try reducing the dose to 3.125mg  BID to see if this helps with his symptoms. If no improvement, could transition from Coreg to Toprol-XL as the side-effect profile is sometimes better in this regard.   3. HTN - he does not have a BP cuff at home but was well-controlled at his prior office visits. Continue current medication regimen.   4. HLD -  LDL improved to 34 when checked in 01/2018. At goal of less than 70. Continue Atorvastatin 80mg  daily.   5. COVID-19 Education - The signs and symptoms of COVID-19 were discussed with the patient. The importance of social distancing was discussed today.  Time:   Today, I have spent 31 minutes with the patient with telehealth technology discussing the above problems.     Medication Adjustments/Labs and Tests Ordered: Current medicines are reviewed at length with the patient today.  Concerns regarding medicines are outlined above.   Tests Ordered: No orders of the defined types were placed in this encounter.   Medication Changes: No orders of the defined types were placed in this encounter.   Disposition:  Follow up with Dr. Diona Browner in 3-4 months  Signed, Ellsworth Lennox, PA-C  07/10/2018 2:26 PM    [facility]

## 2018-07-10 NOTE — Patient Instructions (Signed)
Medication Instructions:  Your physician recommends that you continue on your current medications as directed. Please refer to the Current Medication list given to you today.   Labwork: NONE   Testing/Procedures: NONE  Follow-Up: Your physician recommends that you schedule a follow-up appointment in: 3-4 Months with Dr. Diona Browner in the [place] office.    Any Other Special Instructions Will Be Listed Below (If Applicable).     If you need a refill on your cardiac medications before your next appointment, please call your pharmacy.  Thank you for choosing [facility]!

## 2018-09-23 NOTE — Progress Notes (Signed)
Cardiology Office Note  Date: 09/24/2018   ID: Thomas Black., DOB 05-18-1940, MRN [id number]  PCP:  Asencion Noble, MD  Cardiologist:  Rozann Lesches, MD Electrophysiologist:  None   Chief Complaint  Patient presents with  . Cardiac follow-up    History of Present Illness: Thomas Black. is a 78 y.o. male last assessed via telehealth encounter in May with Ms. Delano Metz.  He presents for a routine visit.  He does not report any angina symptoms at this time, still feels short of breath with activity and a mild sense of orthopnea.  He reports stable weight, no leg swelling.  He has been compliant with his medications as listed below.  He is due for a follow-up echocardiogram in comparison to the previous evaluation in January, now following medication adjustments.  We also discussed switching him from Brilinta to Plavix to see if this could be related to his shortness of breath.  Past Medical History:  Diagnosis Date  . Chronic neck pain   . Coronary artery disease    a. 11/2017: s/p anterolateral STEMI with cath showing severe thrombotic occlusion of proximal-LAD --> PCI/DES performed  . Headache(784.0)   . Hypercholesteremia   . Hypertension   . Ischemic cardiomyopathy    a. 11/2017: EF 35% to 40% with akinesis of the anteroseptal and apical myocardium noted  . Migraine   . Muscle spasms of neck   . Myocardial infarction [facility])     Past Surgical History:  Procedure Laterality Date  . CHOLECYSTECTOMY     1/12  . COLONOSCOPY  03/15/2011   Procedure: COLONOSCOPY;  Surgeon: Rogene Houston, MD;  [facility];  Service: Endoscopy;  Laterality: N/A;  9:30 am  . CORONARY STENT INTERVENTION N/A 12/03/2017   Procedure: CORONARY STENT INTERVENTION;  Surgeon: Sherren Mocha, MD;  Location: [facility] CV LAB;  Service: Cardiovascular;  Laterality: N/A;  . ESOPHAGOGASTRODUODENOSCOPY N/A 06/26/2012   Procedure: ESOPHAGOGASTRODUODENOSCOPY (EGD);  Surgeon: Rogene Houston, MD;  [facility];  Service: Endoscopy;  Laterality: N/A;  . fractured jaw    . fractured nose    . left acl repair    . LEFT HEART CATH AND CORONARY ANGIOGRAPHY N/A 12/03/2017   Procedure: LEFT HEART CATH AND CORONARY ANGIOGRAPHY;  Surgeon: Sherren Mocha, MD;  Location: [place] CV LAB;  Service: Cardiovascular;  Laterality: N/A;  . Left Knee Arthroscopy    . TIBIA FRACTURE SURGERY      Current Outpatient Medications  Medication Sig Dispense Refill  . acetaminophen (TYLENOL) 325 MG tablet Take 650 mg by mouth every 6 (six) hours as needed for mild pain.    Marland Kitchen aspirin 81 MG EC tablet Take 1 tablet (81 mg total) by mouth daily. 30 tablet 0  . atorvastatin (LIPITOR) 80 MG tablet Take 1 tablet (80 mg total) by mouth daily at 6 PM. 90 tablet 3  . carvedilol (COREG) 6.25 MG tablet Take 1 tablet (6.25 mg total) by mouth 2 (two) times daily with a meal. 180 tablet 3  . cyclobenzaprine (FLEXERIL) 10 MG tablet Take by mouth.    . docusate sodium (COLACE) 50 MG capsule Take 50 mg by mouth 2 (two) times daily.    . Melatonin 5 MG CAPS Take 5 mg by mouth at bedtime.    Marland Kitchen oxyCODONE-acetaminophen (PERCOCET) 10-325 MG per tablet Take 0.5-1 tablets by mouth daily as needed for pain (for migraine pain). migraines    . sacubitril-valsartan (  ENTRESTO) 24-26 MG Take 1 tablet by mouth 2 (two) times daily. 60 tablet 11  . spironolactone (ALDACTONE) 25 MG tablet Take 1 tablet (25 mg total) by mouth daily. 90 tablet 3  . clopidogrel (PLAVIX) 75 MG tablet Take 1 tablet (75 mg total) by mouth daily. 90 tablet 3   No current facility-administered medications for this visit.    Allergies:  Patient has no known allergies.   Social History: The patient  reports that he has never smoked. He has never used smokeless tobacco. He reports that he does not drink alcohol or use drugs.   ROS:  Please see the history of present illness. Otherwise, complete review of systems is positive for none.  All  other systems are reviewed and negative.   Physical Exam: VS:  BP (!) 113/58   Pulse 60   Temp (!) 97.3 F (36.3 C)   Ht 6' (1.829 m)   Wt 200 lb (90.7 kg)   BMI 27.12 kg/m , BMI Body mass index is 27.12 kg/m.  Wt Readings from Last 3 Encounters:  09/24/18 200 lb (90.7 kg)  07/10/18 194 lb (88 kg)  03/19/18 200 lb (90.7 kg)    General: Elderly male, appears comfortable at rest. HEENT: Conjunctiva and lids normal, wearing a mask. Neck: Supple, no elevated JVP or carotid bruits, no thyromegaly. Lungs: Clear to auscultation, nonlabored breathing at rest. Cardiac: Regular rate and rhythm, no S3 or significant systolic murmur, no pericardial rub. Abdomen: Soft, nontender, bowel sounds present. Extremities: No pitting edema, distal pulses 2+. Skin: Warm and dry. Musculoskeletal: No kyphosis. Neuropsychiatric: Alert and oriented x3, affect grossly appropriate.  ECG:  An ECG dated 01/01/2018 was personally reviewed today and demonstrated:  Sinus rhythm with low voltage and old anterior infarct pattern.  Recent Labwork: 12/03/2017: ALT 25; AST 38 12/04/2017: Hemoglobin 16.2; Platelets 279 04/05/2018: BUN 15; Creatinine, Ser 0.94; Potassium 4.3; Sodium 139     Component Value Date/Time   CHOL 200 12/04/2017 0437   TRIG 163 (H) 12/04/2017 0437   HDL 60 12/04/2017 0437   CHOLHDL 3.3 12/04/2017 0437   VLDL 33 12/04/2017 0437   LDLCALC 107 (H) 12/04/2017 0437  December 2019: Cholesterol 127, triglycerides 231, HDL 47, LDL 34, ALT 21  Other Studies Reviewed Today:  Echocardiogram 03/22/2018: Study Conclusions  - Left ventricle: The cavity size was normal. Wall thickness was   increased in a pattern of moderate LVH. Systolic function was   moderately to severely reduced. The estimated ejection fraction   was in the range of 30% to 35%. Doppler parameters are consistent   with abnormal left ventricular relaxation (grade 1 diastolic   dysfunction). - Regional wall motion abnormality:  Akinesis of the mid-apical   anterior, mid anteroseptal, apical inferior, mid anterolateral,   apical septal, apical lateral, and apical myocardium. - Aortic valve: Valve area (VTI): 3.04 cm^2. Valve area (Vmax):   3.16 cm^2.  Assessment and Plan:  1.  Cardiomyopathy with LVEF 30 to 35% as of January.  Medication adjustments have been made since that time and we will plan to follow-up with an echocardiogram for reevaluation.  Current regimen includes Coreg, Entresto, and Aldactone.  2.  CAD status post anterolateral STEMI in October 2019 with DES to the proximal LAD.  He continues aspirin and Brilinta, we plan to switch to Plavix to see if this helps with any of his shortness of breath.  3.  Mixed hyperlipidemia, continues to tolerate Lipitor.  LDL was 34 in December  2019 by lab work per Dr. Ouida SillsFagan.  4.  Essential hypertension, blood pressure well controlled today.  Medication Adjustments/Labs and Tests Ordered: Current medicines are reviewed at length with the patient today.  Concerns regarding medicines are outlined above.   Tests Ordered: Orders Placed This Encounter  Procedures  . ECHOCARDIOGRAM COMPLETE    Medication Changes: Meds ordered this encounter  Medications  . clopidogrel (PLAVIX) 75 MG tablet    Sig: Take 1 tablet (75 mg total) by mouth daily.    Dispense:  90 tablet    Refill:  3    09/24/18 pt to start after he finishes his Brilinta    Disposition:  Follow up 3 months in the [place] office.  Signed, Jonelle SidleSamuel G. Kanyon Bunn, MD, [facility] HospitalFACC 09/24/2018 10:07 AM    [facility] [address], [place], [place] [address] Phone: [phone]; Fax: [phone]

## 2018-09-24 ENCOUNTER — Telehealth: Payer: Self-pay | Admitting: Cardiology

## 2018-09-24 ENCOUNTER — Ambulatory Visit (INDEPENDENT_AMBULATORY_CARE_PROVIDER_SITE_OTHER): Payer: Medicare Other | Admitting: Cardiology

## 2018-09-24 ENCOUNTER — Other Ambulatory Visit: Payer: Self-pay

## 2018-09-24 ENCOUNTER — Encounter: Payer: Self-pay | Admitting: Cardiology

## 2018-09-24 VITALS — BP 113/58 | HR 60 | Temp 97.3°F | Ht 72.0 in | Wt 200.0 lb

## 2018-09-24 DIAGNOSIS — E782 Mixed hyperlipidemia: Secondary | ICD-10-CM | POA: Diagnosis not present

## 2018-09-24 DIAGNOSIS — I255 Ischemic cardiomyopathy: Secondary | ICD-10-CM | POA: Diagnosis not present

## 2018-09-24 DIAGNOSIS — I25119 Atherosclerotic heart disease of native coronary artery with unspecified angina pectoris: Secondary | ICD-10-CM | POA: Diagnosis not present

## 2018-09-24 DIAGNOSIS — I10 Essential (primary) hypertension: Secondary | ICD-10-CM | POA: Diagnosis not present

## 2018-09-24 MED ORDER — CLOPIDOGREL BISULFATE 75 MG PO TABS: 75.0000 mg | ORAL_TABLET | Freq: Every day | ORAL | 3 refills | Status: DC

## 2018-09-24 NOTE — Patient Instructions (Signed)
Medication Instructions: After you finish your last dose of Brilinta, STOP  Then, START Plavix 75 mg daily  Labwork: none Procedures/Testing: Your physician has requested that you have an echocardiogram. Echocardiography is a painless test that uses sound waves to create images of your heart. It provides your doctor with information about the size and shape of your heart and how well your heart's chambers and valves are working. This procedure takes approximately one hour. There are no restrictions for this procedure.   Follow-Up: 3 months with Dr.McDowell  Any Additional Special Instructions Will Be Listed Below (If Applicable).     If you need a refill on your cardiac medications before your next appointment, please call your pharmacy.

## 2018-09-24 NOTE — Telephone Encounter (Signed)
°  Precert needed for: ECHO   Location: [place]    Date: Oct 03, 2018

## 2018-10-03 ENCOUNTER — Inpatient Hospital Stay (HOSPITAL_COMMUNITY): Admission: RE | Admit: 2018-10-03 | Payer: Medicare Other | Source: Ambulatory Visit

## 2018-10-03 ENCOUNTER — Telehealth: Payer: Self-pay

## 2018-10-03 ENCOUNTER — Ambulatory Visit (HOSPITAL_COMMUNITY)
Admission: RE | Admit: 2018-10-03 | Discharge: 2018-10-03 | Disposition: A | Discharge status: Home / Self Care | Payer: Medicare Other | Source: Ambulatory Visit | Attending: Cardiology | Admitting: Cardiology

## 2018-10-03 ENCOUNTER — Other Ambulatory Visit: Payer: Self-pay

## 2018-10-03 DIAGNOSIS — I255 Ischemic cardiomyopathy: Secondary | ICD-10-CM | POA: Diagnosis not present

## 2018-10-03 NOTE — Progress Notes (Signed)
*  PRELIMINARY RESULTS* Echocardiogram 2D Echocardiogram has been performed.  Thomas Black 10/03/2018, 2:50 PM

## 2018-10-03 NOTE — Telephone Encounter (Signed)
-----   Message from Satira Sark, MD sent at 10/03/2018  3:08 PM EDT ----- Results reviewed.  Follow-up study shows LVEF still in the range of 30 to 35% despite medication optimization.  Would suggest EP consultation to discuss possibility of prophylactic ICD, otherwise keep follow-up plan.

## 2018-10-03 NOTE — Telephone Encounter (Signed)
Results of echo given to patient, ref to EP clinic placed, pt awaits call

## 2018-11-19 ENCOUNTER — Encounter: Payer: Self-pay | Admitting: Internal Medicine

## 2018-11-19 ENCOUNTER — Ambulatory Visit (INDEPENDENT_AMBULATORY_CARE_PROVIDER_SITE_OTHER): Payer: Medicare Other | Admitting: Internal Medicine

## 2018-11-19 ENCOUNTER — Telehealth: Payer: Self-pay | Admitting: Internal Medicine

## 2018-11-19 ENCOUNTER — Other Ambulatory Visit: Payer: Self-pay

## 2018-11-19 VITALS — BP 119/75 | HR 62 | Temp 97.5°F | Ht 72.0 in | Wt 203.0 lb

## 2018-11-19 DIAGNOSIS — I255 Ischemic cardiomyopathy: Secondary | ICD-10-CM | POA: Diagnosis not present

## 2018-11-19 NOTE — H&P (View-Only) (Signed)
HPI [name] is referred today by Dr. Domenic Polite for consideration for ICD insertion. He is a 78 yo man who is very active. He sustained an MI last year and underwent PCI/stenting of the LAD. He notes that despite medical therapy his EF is 30% by echo. His CHF is class 2A. He has not had syncope. He denies medical non-compliance. He denies anginal symptoms. No edema.  No Known Allergies   Current Outpatient Medications  Medication Sig Dispense Refill  . acetaminophen (TYLENOL) 325 MG tablet Take 650 mg by mouth every 6 (six) hours as needed for mild pain.    Marland Kitchen aspirin 81 MG EC tablet Take 1 tablet (81 mg total) by mouth daily. 30 tablet 0  . atorvastatin (LIPITOR) 80 MG tablet Take 1 tablet (80 mg total) by mouth daily at 6 PM. 90 tablet 3  . carvedilol (COREG) 6.25 MG tablet Take 1 tablet (6.25 mg total) by mouth 2 (two) times daily with a meal. 180 tablet 3  . clopidogrel (PLAVIX) 75 MG tablet Take 1 tablet (75 mg total) by mouth daily. 90 tablet 3  . cyclobenzaprine (FLEXERIL) 10 MG tablet Take by mouth.    . docusate sodium (COLACE) 50 MG capsule Take 50 mg by mouth 2 (two) times daily.    . Melatonin 5 MG CAPS Take 5 mg by mouth at bedtime.    Marland Kitchen oxyCODONE-acetaminophen (PERCOCET) 10-325 MG per tablet Take 0.5-1 tablets by mouth daily as needed for pain (for migraine pain). migraines    . sacubitril-valsartan (ENTRESTO) 24-26 MG Take 1 tablet by mouth 2 (two) times daily. 60 tablet 11  . spironolactone (ALDACTONE) 25 MG tablet Take 1 tablet (25 mg total) by mouth daily. 90 tablet 3   No current facility-administered medications for this visit.      Past Medical History:  Diagnosis Date  . Chronic neck pain   . Coronary artery disease    a. 11/2017: s/p anterolateral STEMI with cath showing severe thrombotic occlusion of proximal-LAD --> PCI/DES performed  . Headache(784.0)   . Hypercholesteremia   . Hypertension   . Ischemic cardiomyopathy    a. 11/2017: EF 35% to 40%  with akinesis of the anteroseptal and apical myocardium noted  . Migraine   . Muscle spasms of neck   . Myocardial infarction ([place])     ROS:   All systems reviewed and negative except as noted in the HPI.   Past Surgical History:  Procedure Laterality Date  . CHOLECYSTECTOMY     1/12  . COLONOSCOPY  03/15/2011   Procedure: COLONOSCOPY;  Surgeon: Rogene Houston, MD;  [facility];  Service: Endoscopy;  Laterality: N/A;  9:30 am  . CORONARY STENT INTERVENTION N/A 12/03/2017   Procedure: CORONARY STENT INTERVENTION;  Surgeon: Sherren Mocha, MD;  Location: [facility];  Service: Cardiovascular;  Laterality: N/A;  . ESOPHAGOGASTRODUODENOSCOPY N/A 06/26/2012   Procedure: ESOPHAGOGASTRODUODENOSCOPY (EGD);  Surgeon: Rogene Houston, MD;  [facility];  Service: Endoscopy;  Laterality: N/A;  . fractured jaw    . fractured nose    . left acl repair    . LEFT HEART CATH AND CORONARY ANGIOGRAPHY N/A 12/03/2017   Procedure: LEFT HEART CATH AND CORONARY ANGIOGRAPHY;  Surgeon: Sherren Mocha, MD;  Location: [facility];  Service: Cardiovascular;  Laterality: N/A;  . Left Knee Arthroscopy    . TIBIA FRACTURE SURGERY       Family History  Problem  Relation Age of Onset  . Cancer Mother   . Colon cancer Neg Hx   . Heart disease Neg Hx      Social History   Socioeconomic History  . Marital status: Married    Spouse name: Joy  . Number of children: Not on file  . Years of education: Not on file  . Highest education level: Not on file  Occupational History  . Occupation: Former highway patrolman   Social Needs  . Financial resource strain: Not on file  . Food insecurity    Worry: Not on file    Inability: Not on file  . Transportation needs    Medical: Not on file    Non-medical: Not on file  Tobacco Use  . Smoking status: Never Smoker  . Smokeless tobacco: Never Used  Substance and Sexual Activity  . Alcohol use: No  . Drug use: No  .  Sexual activity: Not on file  Lifestyle  . Physical activity    Days per week: Not on file    Minutes per session: Not on file  . Stress: Not on file  Relationships  . Social connections    Talks on phone: Not on file    Gets together: Not on file    Attends religious service: Not on file    Active member of club or organization: Not on file    Attends meetings of clubs or organizations: Not on file    Relationship status: Not on file  . Intimate partner violence    Fear of current or ex partner: Not on file    Emotionally abused: Not on file    Physically abused: Not on file    Forced sexual activity: Not on file  Other Topics Concern  . Not on file  Social History Narrative  . Not on file     BP 119/75 (BP Location: Right Arm)   Pulse 62   Temp (!) 97.5 F (36.4 C)   Ht 6' (1.829 m)   Wt 203 lb (92.1 kg)   SpO2 97%   BMI 27.53 kg/m   Physical Exam:  Well appearing NAD HEENT: Unremarkable Neck:  No JVD, no thyromegally Lymphatics:  No adenopathy Back:  No CVA tenderness Lungs:  Clear with no wheezes HEART:  Regular rate rhythm, no murmurs, no rubs, no clicks Abd:  soft, positive bowel sounds, no organomegally, no rebound, no guarding Ext:  2 plus pulses, no edema, no cyanosis, no clubbing Skin:  No rashes no nodules Neuro:  CN II through XII intact, motor grossly intact  EKG - reviewed. NSR  Assess/Plan: 1. ICM - he has persistent LV dysfunction despite maximal medical therapy. He has class 2 symptoms. His EF is 30%. I have discussed the indications/risks/benefits/goals/expectation of ICD insertion and he will call us if he wishes to proceed.  2. Chronic systolic heart failure - his symptoms are class 2A. He will continue his current meds.  Gregg Taylor,M.D. 

## 2018-11-19 NOTE — Patient Instructions (Signed)
Medication Instructions:  Your physician recommends that you continue on your current medications as directed. Please refer to the Current Medication list given to you today.  If you need a refill on your cardiac medications before your next appointment, please call your pharmacy.   Lab work: NONE  If you have labs (blood work) drawn today and your tests are completely normal, you will receive your results only by: Marland Kitchen MyChart Message (if you have MyChart) OR . A paper copy in the mail If you have any lab test that is abnormal or we need to change your treatment, we will call you to review the results.  Testing/Procedures: Your physician has recommended that you have a pacemaker inserted. A pacemaker is a small device that is placed under the skin of your chest or abdomen to help control abnormal heart rhythms. This device uses electrical pulses to prompt the heart to beat at a normal rate. Pacemakers are used to treat heart rhythms that are too slow. Wire (leads) are attached to the pacemaker that goes into the chambers of you heart. This is done in the hospital and usually requires and overnight stay. Please see the instruction sheet given to you today for more information.  Available dates: 9/30, 10/2, 10/5, 10/9, 10/21, 10/23 please call to schedule.   Follow-Up: At [facility], you and your health needs are our priority.  As part of our continuing mission to provide you with exceptional heart care, we have created designated Provider Care Teams.  These Care Teams include your primary Cardiologist (physician) and Advanced Practice Providers (APPs -  Physician Assistants and Nurse Practitioners) who all work together to provide you with the care you need, when you need it. You will need a follow up appointment.  Please call our office 2 months in advance to schedule this appointment.  You may see [name], MD or one of the following Advanced Practice Providers on your designated Care Team:    Chanetta Marshall, NP . Tommye Standard, PA-C  Any Other Special Instructions Will Be Listed Below (If Applicable). Thank you for choosing [name]!

## 2018-11-19 NOTE — Telephone Encounter (Signed)
Calling to schedule pacemaker placement/tg

## 2018-11-19 NOTE — Progress Notes (Signed)
HPI Thomas Black is referred today by Dr. Domenic Polite for consideration for ICD insertion. He is a 78 yo man who is very active. He sustained an MI last year and underwent PCI/stenting of the LAD. He notes that despite medical therapy his EF is 30% by echo. His CHF is class 2A. He has not had syncope. He denies medical non-compliance. He denies anginal symptoms. No edema.  No Known Allergies   Current Outpatient Medications  Medication Sig Dispense Refill  . acetaminophen (TYLENOL) 325 MG tablet Take 650 mg by mouth every 6 (six) hours as needed for mild pain.    Marland Kitchen aspirin 81 MG EC tablet Take 1 tablet (81 mg total) by mouth daily. 30 tablet 0  . atorvastatin (LIPITOR) 80 MG tablet Take 1 tablet (80 mg total) by mouth daily at 6 PM. 90 tablet 3  . carvedilol (COREG) 6.25 MG tablet Take 1 tablet (6.25 mg total) by mouth 2 (two) times daily with a meal. 180 tablet 3  . clopidogrel (PLAVIX) 75 MG tablet Take 1 tablet (75 mg total) by mouth daily. 90 tablet 3  . cyclobenzaprine (FLEXERIL) 10 MG tablet Take by mouth.    . docusate sodium (COLACE) 50 MG capsule Take 50 mg by mouth 2 (two) times daily.    . Melatonin 5 MG CAPS Take 5 mg by mouth at bedtime.    Marland Kitchen oxyCODONE-acetaminophen (PERCOCET) 10-325 MG per tablet Take 0.5-1 tablets by mouth daily as needed for pain (for migraine pain). migraines    . sacubitril-valsartan (ENTRESTO) 24-26 MG Take 1 tablet by mouth 2 (two) times daily. 60 tablet 11  . spironolactone (ALDACTONE) 25 MG tablet Take 1 tablet (25 mg total) by mouth daily. 90 tablet 3   No current facility-administered medications for this visit.      Past Medical History:  Diagnosis Date  . Chronic neck pain   . Coronary artery disease    a. 11/2017: s/p anterolateral STEMI with cath showing severe thrombotic occlusion of proximal-LAD --> PCI/DES performed  . Headache(784.0)   . Hypercholesteremia   . Hypertension   . Ischemic cardiomyopathy    a. 11/2017: EF 35% to 40%  with akinesis of the anteroseptal and apical myocardium noted  . Migraine   . Muscle spasms of neck   . Myocardial infarction ([place])     ROS:   All systems reviewed and negative except as noted in the HPI.   Past Surgical History:  Procedure Laterality Date  . CHOLECYSTECTOMY     1/12  . COLONOSCOPY  03/15/2011   Procedure: COLONOSCOPY;  Surgeon: Rogene Houston, MD;  [facility];  Service: Endoscopy;  Laterality: N/A;  9:30 am  . CORONARY STENT INTERVENTION N/A 12/03/2017   Procedure: CORONARY STENT INTERVENTION;  Surgeon: Sherren Mocha, MD;  Location: [facility];  Service: Cardiovascular;  Laterality: N/A;  . ESOPHAGOGASTRODUODENOSCOPY N/A 06/26/2012   Procedure: ESOPHAGOGASTRODUODENOSCOPY (EGD);  Surgeon: Rogene Houston, MD;  [facility];  Service: Endoscopy;  Laterality: N/A;  . fractured jaw    . fractured nose    . left acl repair    . LEFT HEART CATH AND CORONARY ANGIOGRAPHY N/A 12/03/2017   Procedure: LEFT HEART CATH AND CORONARY ANGIOGRAPHY;  Surgeon: Sherren Mocha, MD;  Location: [facility];  Service: Cardiovascular;  Laterality: N/A;  . Left Knee Arthroscopy    . TIBIA FRACTURE SURGERY       Family History  Problem  Relation Age of Onset  . Cancer Mother   . Colon cancer Neg Hx   . Heart disease Neg Hx      Social History   Socioeconomic History  . Marital status: Married    Spouse name: Joy  . Number of children: Not on file  . Years of education: Not on file  . Highest education level: Not on file  Occupational History  . Occupation: Former Secretary/administrator  . Financial resource strain: Not on file  . Food insecurity    Worry: Not on file    Inability: Not on file  . Transportation needs    Medical: Not on file    Non-medical: Not on file  Tobacco Use  . Smoking status: Never Smoker  . Smokeless tobacco: Never Used  Substance and Sexual Activity  . Alcohol use: No  . Drug use: No  .  Sexual activity: Not on file  Lifestyle  . Physical activity    Days per week: Not on file    Minutes per session: Not on file  . Stress: Not on file  Relationships  . Social Musician on phone: Not on file    Gets together: Not on file    Attends religious service: Not on file    Active member of club or organization: Not on file    Attends meetings of clubs or organizations: Not on file    Relationship status: Not on file  . Intimate partner violence    Fear of current or ex partner: Not on file    Emotionally abused: Not on file    Physically abused: Not on file    Forced sexual activity: Not on file  Other Topics Concern  . Not on file  Social History Narrative  . Not on file     BP 119/75 (BP Location: Right Arm)   Pulse 62   Temp (!) 97.5 F (36.4 C)   Ht 6' (1.829 m)   Wt 203 lb (92.1 kg)   SpO2 97%   BMI 27.53 kg/m   Physical Exam:  Well appearing NAD HEENT: Unremarkable Neck:  No JVD, no thyromegally Lymphatics:  No adenopathy Back:  No CVA tenderness Lungs:  Clear with no wheezes HEART:  Regular rate rhythm, no murmurs, no rubs, no clicks Abd:  soft, positive bowel sounds, no organomegally, no rebound, no guarding Ext:  2 plus pulses, no edema, no cyanosis, no clubbing Skin:  No rashes no nodules Neuro:  CN II through XII intact, motor grossly intact  EKG - reviewed. NSR  Assess/Plan: 1. ICM - he has persistent LV dysfunction despite maximal medical therapy. He has class 2 symptoms. His EF is 30%. I have discussed the indications/risks/benefits/goals/expectation of ICD insertion and he will call us if he wishes to proceed.  2. Chronic systolic heart failure - his symptoms are class 2A. He will continue his current meds.  Leonia Reeves.D.

## 2018-11-19 NOTE — Telephone Encounter (Signed)
Pt called back and stated that he would like to schedule for 9/30 or 10/2. Please advise on meds to hold.

## 2018-11-22 ENCOUNTER — Encounter: Payer: Self-pay | Admitting: *Deleted

## 2018-11-22 NOTE — Addendum Note (Signed)
Addended by: Levonne Hubert on: 11/22/2018 12:43 PM   Modules accepted: Orders

## 2018-11-22 NOTE — Telephone Encounter (Signed)
Pt is calling back stating he hasn't heard anything concerning his pacemaker placement.

## 2018-11-25 ENCOUNTER — Other Ambulatory Visit (HOSPITAL_COMMUNITY)
Admission: RE | Admit: 2018-11-25 | Discharge: 2018-11-25 | Disposition: A | Discharge status: Home / Self Care | Payer: Medicare Other | Source: Ambulatory Visit | Attending: Internal Medicine | Admitting: Internal Medicine

## 2018-11-25 DIAGNOSIS — Z01812 Encounter for preprocedural laboratory examination: Secondary | ICD-10-CM | POA: Diagnosis not present

## 2018-11-25 DIAGNOSIS — Z20828 Contact with and (suspected) exposure to other viral communicable diseases: Secondary | ICD-10-CM | POA: Diagnosis not present

## 2018-11-25 LAB — CBC WITH DIFFERENTIAL/PLATELET
Abs Immature Granulocytes: 0.03 10*3/uL (ref 0.00–0.07)
Basophils Absolute: 0.1 10*3/uL (ref 0.0–0.1)
Basophils Relative: 1 %
Eosinophils Absolute: 0.2 10*3/uL (ref 0.0–0.5)
Eosinophils Relative: 3 %
HCT: 46.5 % (ref 39.0–52.0)
Hemoglobin: 15.1 g/dL (ref 13.0–17.0)
Immature Granulocytes: 0 %
Lymphocytes Relative: 20 %
Lymphs Abs: 1.5 10*3/uL (ref 0.7–4.0)
MCH: 32.4 pg (ref 26.0–34.0)
MCHC: 32.5 g/dL (ref 30.0–36.0)
MCV: 99.8 fL (ref 80.0–100.0)
Monocytes Absolute: 0.5 10*3/uL (ref 0.1–1.0)
Monocytes Relative: 6 %
Neutro Abs: 5.3 10*3/uL (ref 1.7–7.7)
Neutrophils Relative %: 70 %
Platelets: 254 10*3/uL (ref 150–400)
RBC: 4.66 MIL/uL (ref 4.22–5.81)
RDW: 12.9 % (ref 11.5–15.5)
WBC: 7.6 10*3/uL (ref 4.0–10.5)
nRBC: 0 % (ref 0.0–0.2)

## 2018-11-25 LAB — BASIC METABOLIC PANEL
Anion gap: 8 (ref 5–15)
BUN: 13 mg/dL (ref 8–23)
CO2: 27 mmol/L (ref 22–32)
Calcium: 9.4 mg/dL (ref 8.9–10.3)
Chloride: 103 mmol/L (ref 98–111)
Creatinine, Ser: 1.07 mg/dL (ref 0.61–1.24)
GFR calc Af Amer: >60 mL/min (ref >60)
GFR calc non Af Amer: >60 mL/min (ref >60)
Glucose, Bld: 112 mg/dL — ABNORMAL HIGH (ref 70–99)
Potassium: 4.3 mmol/L (ref 3.5–5.1)
Sodium: 138 mmol/L (ref 135–145)

## 2018-11-25 LAB — SARS CORONAVIRUS 2 (TAT 6-24 HRS): SARS Coronavirus 2: NEGATIVE

## 2018-11-25 NOTE — Telephone Encounter (Signed)
Pt notified of date and time of ICD placement. Pt in office today for lab sheet and instructions. Pt to have COVID test today

## 2018-11-27 ENCOUNTER — Ambulatory Visit (HOSPITAL_COMMUNITY)
Admission: RE | Admit: 2018-11-27 | Discharge: 2018-11-27 | Disposition: A | Discharge status: Home / Self Care | Payer: Medicare Other | Attending: Internal Medicine | Admitting: Internal Medicine

## 2018-11-27 ENCOUNTER — Encounter (HOSPITAL_COMMUNITY)
Admission: RE | Disposition: A | Discharge status: Home / Self Care | Payer: Self-pay | Source: Home / Self Care | Attending: Internal Medicine

## 2018-11-27 ENCOUNTER — Other Ambulatory Visit: Payer: Self-pay

## 2018-11-27 ENCOUNTER — Encounter (HOSPITAL_COMMUNITY): Payer: Self-pay | Admitting: Internal Medicine

## 2018-11-27 ENCOUNTER — Ambulatory Visit (HOSPITAL_COMMUNITY): Payer: Medicare Other

## 2018-11-27 DIAGNOSIS — I110 Hypertensive heart disease with heart failure: Secondary | ICD-10-CM | POA: Insufficient documentation

## 2018-11-27 DIAGNOSIS — Z955 Presence of coronary angioplasty implant and graft: Secondary | ICD-10-CM | POA: Diagnosis not present

## 2018-11-27 DIAGNOSIS — Z8249 Family history of ischemic heart disease and other diseases of the circulatory system: Secondary | ICD-10-CM | POA: Diagnosis not present

## 2018-11-27 DIAGNOSIS — E7800 Pure hypercholesterolemia, unspecified: Secondary | ICD-10-CM | POA: Insufficient documentation

## 2018-11-27 DIAGNOSIS — I255 Ischemic cardiomyopathy: Secondary | ICD-10-CM | POA: Diagnosis not present

## 2018-11-27 DIAGNOSIS — I2510 Atherosclerotic heart disease of native coronary artery without angina pectoris: Secondary | ICD-10-CM | POA: Insufficient documentation

## 2018-11-27 DIAGNOSIS — Z7902 Long term (current) use of antithrombotics/antiplatelets: Secondary | ICD-10-CM | POA: Diagnosis not present

## 2018-11-27 DIAGNOSIS — I5022 Chronic systolic (congestive) heart failure: Secondary | ICD-10-CM | POA: Diagnosis not present

## 2018-11-27 DIAGNOSIS — Z79899 Other long term (current) drug therapy: Secondary | ICD-10-CM | POA: Insufficient documentation

## 2018-11-27 DIAGNOSIS — I252 Old myocardial infarction: Secondary | ICD-10-CM | POA: Diagnosis not present

## 2018-11-27 DIAGNOSIS — Z006 Encounter for examination for normal comparison and control in clinical research program: Secondary | ICD-10-CM | POA: Insufficient documentation

## 2018-11-27 DIAGNOSIS — Z7982 Long term (current) use of aspirin: Secondary | ICD-10-CM | POA: Diagnosis not present

## 2018-11-27 DIAGNOSIS — Z95810 Presence of automatic (implantable) cardiac defibrillator: Secondary | ICD-10-CM

## 2018-11-27 HISTORY — PX: ICD IMPLANT: EP1208

## 2018-11-27 LAB — SURGICAL PCR SCREEN
MRSA, PCR: NEGATIVE
Staphylococcus aureus: NEGATIVE

## 2018-11-27 SURGERY — ICD IMPLANT

## 2018-11-27 MED ORDER — ACETAMINOPHEN 325 MG PO TABS: 650.0000 mg | ORAL_TABLET | Freq: Four times a day (QID) | ORAL | Status: DC | PRN

## 2018-11-27 MED ORDER — LIDOCAINE HCL (PF) 1 % IJ SOLN
INTRAMUSCULAR | Status: DC | PRN
  Administered 2018-11-27: 45 mL

## 2018-11-27 MED ORDER — DOCUSATE SODIUM 100 MG PO CAPS: 100.0000 mg | ORAL_CAPSULE | Freq: Every day | ORAL | Status: DC

## 2018-11-27 MED ORDER — FENTANYL CITRATE (PF) 100 MCG/2ML IJ SOLN
INTRAMUSCULAR | Status: AC
  Filled 2018-11-27: qty 2

## 2018-11-27 MED ORDER — MIDAZOLAM HCL 5 MG/5ML IJ SOLN
INTRAMUSCULAR | Status: AC
  Filled 2018-11-27: qty 5

## 2018-11-27 MED ORDER — ASPIRIN 81 MG PO TBEC: 81.0000 mg | DELAYED_RELEASE_TABLET | Freq: Every day | ORAL | Status: DC

## 2018-11-27 MED ORDER — SODIUM CHLORIDE 0.9 % IV SOLN
80.0000 mg | INTRAVENOUS | Status: AC
  Administered 2018-11-27: 80 mg

## 2018-11-27 MED ORDER — SODIUM CHLORIDE 0.9 % IV SOLN
INTRAVENOUS | Status: AC
  Filled 2018-11-27: qty 2

## 2018-11-27 MED ORDER — SACUBITRIL-VALSARTAN 24-26 MG PO TABS: 1.0000 | ORAL_TABLET | Freq: Two times a day (BID) | ORAL | Status: DC

## 2018-11-27 MED ORDER — ACETAMINOPHEN 325 MG PO TABS
325.0000 mg | ORAL_TABLET | ORAL | Status: DC | PRN
  Administered 2018-11-27: 14:00:00 650 mg via ORAL

## 2018-11-27 MED ORDER — ONDANSETRON HCL 4 MG/2ML IJ SOLN: 4.0000 mg | Freq: Four times a day (QID) | INTRAMUSCULAR | Status: DC | PRN

## 2018-11-27 MED ORDER — MELATONIN 5 MG PO CAPS: 5.0000 mg | ORAL_CAPSULE | Freq: Every day | ORAL | Status: DC

## 2018-11-27 MED ORDER — LIDOCAINE HCL (PF) 1 % IJ SOLN
INTRAMUSCULAR | Status: AC
  Filled 2018-11-27: qty 30

## 2018-11-27 MED ORDER — ATORVASTATIN CALCIUM 80 MG PO TABS: 80.0000 mg | ORAL_TABLET | Freq: Every day | ORAL | Status: DC

## 2018-11-27 MED ORDER — CEFAZOLIN SODIUM-DEXTROSE 1-4 GM/50ML-% IV SOLN: 1.0000 g | Freq: Four times a day (QID) | INTRAVENOUS | Status: DC

## 2018-11-27 MED ORDER — MUPIROCIN 2 % EX OINT
TOPICAL_OINTMENT | CUTANEOUS | Status: AC
  Administered 2018-11-27: 10:00:00
  Filled 2018-11-27: qty 22

## 2018-11-27 MED ORDER — HEPARIN (PORCINE) IN NACL 1000-0.9 UT/500ML-% IV SOLN
INTRAVENOUS | Status: DC | PRN
  Administered 2018-11-27: 500 mL

## 2018-11-27 MED ORDER — HEPARIN (PORCINE) IN NACL 1000-0.9 UT/500ML-% IV SOLN
INTRAVENOUS | Status: AC
  Filled 2018-11-27: qty 500

## 2018-11-27 MED ORDER — CEFAZOLIN SODIUM-DEXTROSE 2-4 GM/100ML-% IV SOLN
2.0000 g | INTRAVENOUS | Status: AC
  Administered 2018-11-27: 2 g via INTRAVENOUS

## 2018-11-27 MED ORDER — FENTANYL CITRATE (PF) 100 MCG/2ML IJ SOLN
INTRAMUSCULAR | Status: DC | PRN
  Administered 2018-11-27: 12.5 ug via INTRAVENOUS
  Administered 2018-11-27: 25 ug via INTRAVENOUS
  Administered 2018-11-27: 12.5 ug via INTRAVENOUS
  Administered 2018-11-27 (×2): 25 ug via INTRAVENOUS
  Administered 2018-11-27: 12.5 ug via INTRAVENOUS

## 2018-11-27 MED ORDER — CHLORHEXIDINE GLUCONATE 4 % EX LIQD: 60.0000 mL | Freq: Once | CUTANEOUS | Status: DC

## 2018-11-27 MED ORDER — ACETAMINOPHEN 325 MG PO TABS
ORAL_TABLET | ORAL | Status: AC
  Administered 2018-11-27: 650 mg via ORAL
  Filled 2018-11-27: qty 2

## 2018-11-27 MED ORDER — MIDAZOLAM HCL 5 MG/5ML IJ SOLN
INTRAMUSCULAR | Status: DC | PRN
  Administered 2018-11-27: 1 mg via INTRAVENOUS
  Administered 2018-11-27 (×2): 2 mg via INTRAVENOUS
  Administered 2018-11-27: 1 mg via INTRAVENOUS
  Administered 2018-11-27: 2 mg via INTRAVENOUS
  Administered 2018-11-27: 1 mg via INTRAVENOUS

## 2018-11-27 MED ORDER — CYCLOBENZAPRINE HCL 10 MG PO TABS: 10.0000 mg | ORAL_TABLET | Freq: Every day | ORAL | Status: DC | PRN

## 2018-11-27 MED ORDER — SPIRONOLACTONE 25 MG PO TABS: 25.0000 mg | ORAL_TABLET | Freq: Every day | ORAL | Status: DC

## 2018-11-27 MED ORDER — CEFAZOLIN SODIUM-DEXTROSE 2-4 GM/100ML-% IV SOLN
INTRAVENOUS | Status: AC
  Filled 2018-11-27: qty 100

## 2018-11-27 MED ORDER — CARVEDILOL 6.25 MG PO TABS: 6.2500 mg | ORAL_TABLET | Freq: Two times a day (BID) | ORAL | Status: DC

## 2018-11-27 MED ORDER — SODIUM CHLORIDE 0.9 % IV SOLN
INTRAVENOUS | Status: DC
  Administered 2018-11-27: 10:00:00 via INTRAVENOUS

## 2018-11-27 SURGICAL SUPPLY — 10 items
CABLE SURGICAL S-101-97-12 (CABLE) ×3 IMPLANT
GUIDEWIRE ANGLED .035X150CM (WIRE) ×2 IMPLANT
ICD EVERA XT MRI DF1  DDMB1D1 (ICD Generator) ×2 IMPLANT
ICD EVERA XT MRI DF1 DDMB1D1 (ICD Generator) IMPLANT
LEAD CAPSURE NOVUS 5076-52CM (Lead) ×2 IMPLANT
LEAD SPRINT QUAT SEC 6935-65CM (Lead) ×2 IMPLANT
PAD PRO RADIOLUCENT 2001M-C (PAD) ×3 IMPLANT
SHEATH 7FR PRELUDE SNAP 13 (SHEATH) ×2 IMPLANT
SHEATH 9FR PRELUDE SNAP 13 (SHEATH) ×2 IMPLANT
TRAY PACEMAKER INSERTION (PACKS) ×3 IMPLANT

## 2018-11-27 NOTE — Progress Notes (Signed)
Discharge instructions reviewed with pt and his wife (via telephone) both voice understanding.  

## 2018-11-27 NOTE — Progress Notes (Signed)
Renee, PA called for orders

## 2018-11-27 NOTE — Discharge Instructions (Signed)
TOMORROW, 11/28/2018, PLEASE SEND A REMOTE DEVICE TRANSMISSION  RESUME PLAVIX ON Sunday 12/01/2018       Supplemental Discharge Instructions for  Pacemaker/Defibrillator Patients  Activity No heavy lifting or vigorous activity with your left/right arm for 6 to 8 weeks.  Do not raise your left/right arm above your head for one week.  Gradually raise your affected arm as drawn below.             12/01/2018                 12/02/2018                12/03/2018               10 /08/2018 __  NO DRIVING for 1 week  ; you may begin driving on  44/0/1027   .  WOUND CARE - Keep the wound area clean and dry.  Do not get this area wet for one week. No showers for one week; you may shower on 12/04/2018    . - Remove outer plastic dressing tomorrow 11/28/2018, LEAVE the steri-strips (little paper tapes over the incision)  in place, do not remove these - The tape/steri-strips on your wound will fall off; do not pull them off.  No bandage is needed on the site.  DO  NOT apply any creams, oils, or ointments to the wound area. - If you notice any drainage or discharge from the wound, any swelling or bruising at the site, or you develop a fever > 101? F after you are discharged home, call the office at once.  Special Instructions - You are still able to use cellular telephones; use the ear opposite the side where you have your pacemaker/defibrillator.  Avoid carrying your cellular phone near your device. - When traveling through airports, show security personnel your identification card to avoid being screened in the metal detectors.  Ask the security personnel to use the hand wand. - Avoid arc welding equipment, MRI testing (magnetic resonance imaging), TENS units (transcutaneous nerve stimulators).  Call the office for questions about other devices. - Avoid electrical appliances that are in poor condition or are not properly grounded. - Microwave ovens are safe to be near or to operate.  Additional  information for defibrillator patients should your device go off: - If your device goes off ONCE and you feel fine afterward, notify the device clinic nurses. - If your device goes off ONCE and you do not feel well afterward, call 911. - If your device goes off TWICE, call 911. - If your device goes off THREE times in one day, call 911.  DO NOT DRIVE YOURSELF OR A FAMILY MEMBER WITH A DEFIBRILLATOR TO THE HOSPITAL--CALL 911.     Adult, Care After Defibrillator    This sheet gives you information about how to care for yourself after your procedure. Your health care provider may also give you more specific instructions. If you have problems or questions, contact your health care provider. What can I expect after the procedure? After the procedure, it is common to have: Mild pain. Slight bruising. Some swelling over the incision. A slight bump over the skin where the device was placed. Sometimes, it is possible to feel the device under the skin. This is normal. Follow these instructions at home: Medicines Take over-the-counter and prescription medicines only as told by your health care provider. If you were prescribed an antibiotic medicine, take it as told by your health care provider.  Do not stop taking the antibiotic even if you start to feel better. Wound care  Do not remove the bandage on your chest until directed to do so by your health care provider. After your bandage is removed, you may see pieces of tape called skin adhesive strips over the area where the cut was made (incision site). Let them fall off on their own. Check the incision site every day to make sure it is not infected, bleeding, or starting to pull apart. Do not use lotions or ointments near the incision site unless directed to do so. Keep the incision area clean and dry for 2-3 days after the procedure or as directed by your health care provider. It takes several weeks for the incision site to completely heal. Do  not take baths, swim, or use a hot tub for 7-10 days or as otherwise directed by your health care provider. Activity Do not drive or use heavy machinery while taking prescription pain medicine. Do not drive for 24 hours if you were given a medicine to help you relax (sedative). Check with your health care provider before you start to drive or play sports. Avoid sudden jerking, pulling, or chopping movements that pull your upper arm far away from your body. Avoid these movements for at least 6 weeks or as long as told by your health care provider. Do not lift your upper arm above your shoulders for at least 6 weeks or as long as told by your health care provider. This means no tennis, golf, or swimming. You may go back to work when your health care provider says it is okay. Pacemaker care You may be shown how to transfer data from your pacemaker through the phone to your health care provider. Always let all health care providers know about your pacemaker before you have any medical procedures or tests. Wear a medical ID bracelet or necklace stating that you have a pacemaker. Carry a pacemaker ID card with you at all times. Your pacemaker battery will last for 5-15 years. Routine checks by your health care provider will let the health care provider know when the battery is starting to run down. The pacemaker will need to be replaced when the battery starts to run down. Do not use amateur Proofreader. Other electrical devices are safe to use, including power tools, lawn mowers, and speakers. If you are unsure of whether something is safe to use, ask your health care provider. When using your cell phone, hold it to the ear opposite the pacemaker. Do not leave your cell phone in a pocket over the pacemaker. Avoid places or objects that have a strong electric or magnetic field, including: Scientist, physiological. When at the airport, let officials know that you have a  pacemaker. Power plants. Large electrical generators. Radiofrequency transmission towers, such as cell phone and radio towers. General instructions Weigh yourself every day. If you suddenly gain weight, fluid may be building up in your body. Keep all follow-up visits as told by your health care provider. This is important. Contact a health care provider if: You gain weight suddenly. Your legs or feet swell. It feels like your heart is fluttering or skipping beats (heart palpitations). You have chills or a fever. You have more redness, swelling, or pain around your incisions. You have more fluid or blood coming from your incisions. Your incisions feel warm to the touch. You have pus or a bad smell coming from your incisions. Get  help right away if: You have chest pain. You have trouble breathing or are short of breath. You become extremely tired. You are light-headed or you faint. This information is not intended to replace advice given to you by your health care provider. Make sure you discuss any questions you have with your health care provider. Document Released: 09/02/2004 Document Revised: 01/26/2017 Document Reviewed: 11/26/2015 Elsevier Patient Education  2020 [employer].

## 2018-11-27 NOTE — Progress Notes (Signed)
Dr Lovena Le called to place orders. Message left on his cell.

## 2018-11-27 NOTE — Interval H&P Note (Signed)
History and Physical Interval Note:  11/27/2018 10:36 AM  Thomas Black.  has presented today for surgery, with the diagnosis of icm.  The various methods of treatment have been discussed with the patient and family. After consideration of risks, benefits and other options for treatment, the patient has consented to  Procedure(s): ICD IMPLANT (N/A) as a surgical intervention.  The patient's history has been reviewed, patient examined, no change in status, stable for surgery.  I have reviewed the patient's chart and labs.  Questions were answered to the patient's satisfaction.     Thomas Black

## 2018-11-27 NOTE — Progress Notes (Signed)
Patient informed PCR negative

## 2018-12-12 ENCOUNTER — Ambulatory Visit (INDEPENDENT_AMBULATORY_CARE_PROVIDER_SITE_OTHER): Payer: Medicare Other | Admitting: Student

## 2018-12-12 ENCOUNTER — Other Ambulatory Visit: Payer: Self-pay

## 2018-12-12 DIAGNOSIS — Z95810 Presence of automatic (implantable) cardiac defibrillator: Secondary | ICD-10-CM

## 2018-12-12 LAB — CUP PACEART INCLINIC DEVICE CHECK
Battery Remaining Longevity: 129 mo
Battery Voltage: 3.16 V
Brady Statistic AP VP Percent: 0.01 %
Brady Statistic AP VS Percent: 23.78 %
Brady Statistic AS VP Percent: 0.04 %
Brady Statistic AS VS Percent: 76.17 %
Brady Statistic RA Percent Paced: 23.76 %
Brady Statistic RV Percent Paced: 0.05 %
Date Time Interrogation Session: 20201015115054
HighPow Impedance: 64 Ohm
Implantable Lead Implant Date: 20200930
Implantable Lead Implant Date: 20200930
Implantable Lead Location: 753859
Implantable Lead Location: 753860
Implantable Lead Model: 5076
Implantable Lead Model: 6935
Implantable Pulse Generator Implant Date: 20200930
Lead Channel Impedance Value: 342 Ohm
Lead Channel Impedance Value: 399 Ohm
Lead Channel Impedance Value: 475 Ohm
Lead Channel Pacing Threshold Amplitude: 0.75 V
Lead Channel Pacing Threshold Amplitude: 0.875 V
Lead Channel Pacing Threshold Pulse Width: 0.4 ms
Lead Channel Pacing Threshold Pulse Width: 0.4 ms
Lead Channel Sensing Intrinsic Amplitude: 18.875 mV
Lead Channel Sensing Intrinsic Amplitude: 20.625 mV
Lead Channel Sensing Intrinsic Amplitude: 3.375 mV
Lead Channel Sensing Intrinsic Amplitude: 3.75 mV
Lead Channel Setting Pacing Amplitude: 3.5 V
Lead Channel Setting Pacing Amplitude: 3.5 V
Lead Channel Setting Pacing Pulse Width: 0.4 ms
Lead Channel Setting Sensing Sensitivity: 0.3 mV

## 2018-12-12 NOTE — Progress Notes (Signed)
Wound check appointment. Steri-strips removed. Wound without redness or edema. Incision edges approximated, wound well healed. Normal device function. Thresholds, sensing, and impedances consistent with implant measurements. Device programmed at 3.5V for extra safety margin until 3 month visit. Histogram distribution appropriate for patient and level of activity. No mode switches or ventricular arrhythmias noted. Patient educated about wound care, arm mobility, lifting restrictions, shock plan. ROV in 3 months with Dr. Taylor. 

## 2018-12-30 ENCOUNTER — Encounter: Payer: Self-pay | Admitting: Cardiology

## 2018-12-30 NOTE — Progress Notes (Signed)
Cardiology Office Note  Date: 12/31/2018   ID: Thomas Black., DOB [birth year], MRN [id number]  PCP:  [name], MD  Cardiologist:  [name], MD Electrophysiologist:  [name], MD   Chief Complaint  Patient presents with  . Cardiac follow-up    History of Present Illness: Thomas Black. is a 78 y.o. male last seen in July.  He is here for a follow-up visit.  Doing very well at this time.  He reports no chest pain, NYHA class I-II dyspnea.  He has been walking 3 miles in about 45 to 55 minutes most days of the week.  No palpitations or syncope.  He saw Dr. [name] in September for discussion of ICD, and ultimately underwent placement of a Medtronic ICD, wound check noted on October 15.  He has had no device discharges.  I reviewed his medications which are stable and outlined below.  He is tolerating the current cardiac regimen well.  I reviewed his lab work from September.  He will be having a physical with Dr. [name] in about a month.  He is due for follow-up lipids.   Past Medical History:  Diagnosis Date  . Chronic neck pain   . Coronary artery disease    a. 11/2017: s/p anterolateral STEMI with cath showing severe thrombotic occlusion of proximal-LAD --> PCI/DES performed  . Essential hypertension   . Headache(784.0)   . Hypercholesteremia   . ICD (implantable cardioverter-defibrillator) in place    Medtronic, September 2020 - Dr. [name]  . Ischemic cardiomyopathy   . Migraine   . Muscle spasms of neck   . Myocardial infarction [facility])     Past Surgical History:  Procedure Laterality Date  . CHOLECYSTECTOMY     1/12  . COLONOSCOPY  03/15/2011   Procedure: COLONOSCOPY;  Surgeon: [name], MD;  [facility];  Service: Endoscopy;  Laterality: N/A;  9:30 am  . CORONARY STENT INTERVENTION N/A 12/03/2017   Procedure: CORONARY STENT INTERVENTION;  Surgeon: [name], MD;  Location: [facility] INVASIVE CV LAB;  Service: Cardiovascular;   Laterality: N/A;  . ESOPHAGOGASTRODUODENOSCOPY N/A 06/26/2012   Procedure: ESOPHAGOGASTRODUODENOSCOPY (EGD);  Surgeon: [name], MD;  [facility];  Service: Endoscopy;  Laterality: N/A;  . fractured jaw    . fractured nose    . ICD IMPLANT N/A 11/27/2018   Procedure: ICD IMPLANT;  Surgeon: [name], MD;  Location: [facility] INVASIVE CV LAB;  Service: Cardiovascular;  Laterality: N/A;  . left acl repair    . LEFT HEART CATH AND CORONARY ANGIOGRAPHY N/A 12/03/2017   Procedure: LEFT HEART CATH AND CORONARY ANGIOGRAPHY;  Surgeon: [name], MD;  Location: [facility] INVASIVE CV LAB;  Service: Cardiovascular;  Laterality: N/A;  . Left Knee Arthroscopy    . TIBIA FRACTURE SURGERY      Current Outpatient Medications  Medication Sig Dispense Refill  . acetaminophen (TYLENOL) 325 MG tablet Take 650 mg by mouth every 6 (six) hours as needed for mild pain.    Marland Kitchen aspirin 81 MG EC tablet Take 1 tablet (81 mg total) by mouth daily. 30 tablet 0  . atorvastatin (LIPITOR) 80 MG tablet Take 1 tablet (80 mg total) by mouth daily at 6 PM. 90 tablet 3  . carvedilol (COREG) 6.25 MG tablet Take 1 tablet (6.25 mg total) by mouth 2 (two) times daily with a meal. 180 tablet 3  . clopidogrel (PLAVIX) 75 MG tablet Take 1 tablet (75  mg total) by mouth daily. 90 tablet 3  . cyclobenzaprine (FLEXERIL) 10 MG tablet Take 10 mg by mouth daily as needed for muscle spasms.     Marland Kitchen docusate sodium (COLACE) 100 MG capsule Take 100 mg by mouth at bedtime.    . Melatonin 5 MG CAPS Take 10 mg by mouth at bedtime.     Marland Kitchen omeprazole (PRILOSEC) 20 MG capsule Take 20 mg by mouth every evening.    Marland Kitchen oxyCODONE-acetaminophen (PERCOCET) 10-325 MG per tablet Take 0.5-1 tablets by mouth daily as needed for pain (for migraine pain). migraines    . sacubitril-valsartan (ENTRESTO) 24-26 MG Take 1 tablet by mouth 2 (two) times daily. 60 tablet 11  . spironolactone (ALDACTONE) 25 MG tablet Take 1 tablet (25 mg total) by mouth daily.  90 tablet 3   No current facility-administered medications for this visit.    Allergies:  Patient has no known allergies.   Social History: The patient  reports that he has never smoked. He has never used smokeless tobacco. He reports that he does not drink alcohol or use drugs.   ROS:  Please see the history of present illness. Otherwise, complete review of systems is positive for none.  All other systems are reviewed and negative.   Physical Exam: VS:  BP 109/66   Pulse 66   Temp (!) 96.8 F (36 C) (Temporal)   Ht 6' (1.829 m)   Wt 207 lb (93.9 kg)   SpO2 96%   BMI 28.07 kg/m , BMI Body mass index is 28.07 kg/m.  Wt Readings from Last 3 Encounters:  12/31/18 207 lb (93.9 kg)  11/27/18 203 lb (92.1 kg)  11/19/18 203 lb (92.1 kg)    General: Elderly male, appears comfortable at rest. HEENT: Conjunctiva and lids normal, wearing a mask. Neck: Supple, no elevated JVP or carotid bruits, no thyromegaly. Lungs: Clear to auscultation, nonlabored breathing at rest. Cardiac: Regular rate and rhythm, no S3 or significant systolic murmur, no pericardial rub. Abdomen: Soft, nontender, bowel sounds present. Extremities: No pitting edema, distal pulses 2+. Skin: Warm and dry. Musculoskeletal: No kyphosis. Neuropsychiatric: Alert and oriented x3, affect grossly appropriate.  ECG:  An ECG dated 11/28/2018 was personally reviewed today and demonstrated:  Sinus rhythm with low voltage and old anterior infarct pattern.  Recent Labwork: 11/25/2018: BUN 13; Creatinine, Ser 1.07; Hemoglobin 15.1; Platelets 254; Potassium 4.3; Sodium 138     Component Value Date/Time   CHOL 200 12/04/2017 0437   TRIG 163 (H) 12/04/2017 0437   HDL 60 12/04/2017 0437   CHOLHDL 3.3 12/04/2017 0437   VLDL 33 12/04/2017 0437   LDLCALC 107 (H) 12/04/2017 0437    Other Studies Reviewed Today:  Echocardiogram 10/03/2018:  1. Stage 1: 1: Mid and apical anterior wall, mid and apical anterior septum, and apex are  abnormal.  2. The left ventricle has moderate-severely reduced systolic function, with an ejection fraction of 30-35%. The cavity size was normal. Mild posterior wall thickening. Left ventricular diastolic Doppler parameters are consistent with impaired  relaxation. Left ventricular diffuse hypokinesis.  3. The right ventricle has normal systolic function. The cavity was normal. There is no increase in right ventricular wall thickness.  4. The aortic valve is tricuspid. Mild thickening of the aortic valve. Aortic valve regurgitation is trivial by color flow Doppler. Mild to moderate aortic annular calcification noted.  5. The mitral valve is grossly normal.  6. The tricuspid valve is grossly normal.  7. The aorta is normal  in size and structure.  Assessment and Plan:  1.  Ischemic cardiomyopathy with LVEF 30 to 35%.  He is doing quite well at this time with NYHA class I-II dyspnea on medical therapy and good exercise tolerance.  Plan to continue Coreg, Entresto, and Aldactone.  Recent lab work reviewed.  2.  CAD status post anterolateral STEMI in October of last year managed with DES to the proximal LAD.  He reports no active angina and continues on aspirin, Plavix, and statin therapy.  3.  Mixed hyperlipidemia, tolerating Lipitor.  He will have a physical with lab work in the next month per Dr. Willey Blade.  Goal LDL under 70.  4.  Essential hypertension, blood pressure is well controlled today.  No changes made to current regimen.  Medication Adjustments/Labs and Tests Ordered: Current medicines are reviewed at length with the patient today.  Concerns regarding medicines are outlined above.   Tests Ordered: No orders of the defined types were placed in this encounter.   Medication Changes: No orders of the defined types were placed in this encounter.   Disposition:  Follow up 6 months in the [place] office.  Signed, Satira Sark, MD, [facility] 12/31/2018 1:22 PM    [facility] [address] [place], [place] [address] Phone: [phone]; Fax: [phone]

## 2018-12-31 ENCOUNTER — Ambulatory Visit (INDEPENDENT_AMBULATORY_CARE_PROVIDER_SITE_OTHER): Payer: Medicare Other | Admitting: Cardiology

## 2018-12-31 ENCOUNTER — Encounter: Payer: Self-pay | Admitting: Cardiology

## 2018-12-31 ENCOUNTER — Other Ambulatory Visit: Payer: Self-pay

## 2018-12-31 VITALS — BP 109/66 | HR 66 | Temp 96.8°F | Ht 72.0 in | Wt 207.0 lb

## 2018-12-31 DIAGNOSIS — I10 Essential (primary) hypertension: Secondary | ICD-10-CM | POA: Diagnosis not present

## 2018-12-31 DIAGNOSIS — I255 Ischemic cardiomyopathy: Secondary | ICD-10-CM

## 2018-12-31 DIAGNOSIS — Z95810 Presence of automatic (implantable) cardiac defibrillator: Secondary | ICD-10-CM | POA: Diagnosis not present

## 2018-12-31 DIAGNOSIS — I25119 Atherosclerotic heart disease of native coronary artery with unspecified angina pectoris: Secondary | ICD-10-CM | POA: Diagnosis not present

## 2018-12-31 DIAGNOSIS — E782 Mixed hyperlipidemia: Secondary | ICD-10-CM

## 2018-12-31 NOTE — Patient Instructions (Signed)
Medication Instructions:  Your physician recommends that you continue on your current medications as directed. Please refer to the Current Medication list given to you today.  *If you need a refill on your cardiac medications before your next appointment, please call your pharmacy*  Lab Work: None today If you have labs (blood work) drawn today and your tests are completely normal, you will receive your results only by: . MyChart Message (if you have MyChart) OR . A paper copy in the mail If you have any lab test that is abnormal or we need to change your treatment, we will call you to review the results.  Testing/Procedures: None today  Follow-Up: At [facility], you and your health needs are our priority.  As part of our continuing mission to provide you with exceptional heart care, we have created designated Provider Care Teams.  These Care Teams include your primary Cardiologist (physician) and Advanced Practice Providers (APPs -  Physician Assistants and Nurse Practitioners) who all work together to provide you with the care you need, when you need it.  Your next appointment:   6 months  The format for your next appointment:   In Person  Provider:   Samuel McDowell, MD  Other Instructions None     Thank you for choosing [facility] !         

## 2019-01-02 ENCOUNTER — Other Ambulatory Visit: Payer: Self-pay | Admitting: Student

## 2019-01-02 NOTE — Telephone Encounter (Signed)
This is a [place] pt.  °

## 2019-03-04 ENCOUNTER — Other Ambulatory Visit: Payer: Self-pay

## 2019-03-04 ENCOUNTER — Encounter: Payer: Self-pay | Admitting: Internal Medicine

## 2019-03-04 ENCOUNTER — Ambulatory Visit (INDEPENDENT_AMBULATORY_CARE_PROVIDER_SITE_OTHER): Payer: Medicare PPO | Admitting: Internal Medicine

## 2019-03-04 VITALS — BP 124/68 | HR 67 | Ht 72.0 in | Wt 207.0 lb

## 2019-03-04 DIAGNOSIS — I255 Ischemic cardiomyopathy: Secondary | ICD-10-CM | POA: Diagnosis not present

## 2019-03-04 DIAGNOSIS — Z95810 Presence of automatic (implantable) cardiac defibrillator: Secondary | ICD-10-CM | POA: Diagnosis not present

## 2019-03-04 NOTE — Patient Instructions (Signed)
Medication Instructions:  Your physician recommends that you continue on your current medications as directed. Please refer to the Current Medication list given to you today.  *If you need a refill on your cardiac medications before your next appointment, please call your pharmacy*  Lab Work:None  If you have labs (blood work) drawn today and your tests are completely normal, you will receive your results only by: Marland Kitchen MyChart Message (if you have MyChart) OR . A paper copy in the mail If you have any lab test that is abnormal or we need to change your treatment, we will call you to review the results.  Testing/Procedures: nONE  Follow-Up: At [facility], you and your health needs are our priority.  As part of our continuing mission to provide you with exceptional heart care, we have created designated Provider Care Teams.  These Care Teams include your primary Cardiologist (physician) and Advanced Practice Providers (APPs -  Physician Assistants and Nurse Practitioners) who all work together to provide you with the care you need, when you need it.  Your next appointment:   12 month(s)  The format for your next appointment:   In Person  Provider:   [name], MD  Other Instructions None

## 2019-03-04 NOTE — Progress Notes (Signed)
HPI Thomas Black returns today for ongoing evaluation and management of his ICD in the setting of an ICM. He is s/p MI over a year ago. Since his ICD insertion, he has done well. He denies chest pain, sob, or ICD therapy. He has managed to avoid Covid 19. He is burning wood to heat his home.  No Known Allergies   Current Outpatient Medications  Medication Sig Dispense Refill  . acetaminophen (TYLENOL) 325 MG tablet Take 650 mg by mouth every 6 (six) hours as needed for mild pain.    Marland Kitchen aspirin 81 MG EC tablet Take 1 tablet (81 mg total) by mouth daily. 30 tablet 0  . atorvastatin (LIPITOR) 80 MG tablet TAKE ONE TABLET (80MG  TOTAL) BY MOUTH DAILY AT 6PM 90 tablet 3  . carvedilol (COREG) 6.25 MG tablet TAKE ONE TABLET (6.25MG  TOTAL) BY MOUTH TWO TIMES DAILY WITH A MEAL 180 tablet 3  . clopidogrel (PLAVIX) 75 MG tablet Take 1 tablet (75 mg total) by mouth daily. 90 tablet 3  . cyclobenzaprine (FLEXERIL) 10 MG tablet Take 10 mg by mouth daily as needed for muscle spasms.     Marland Kitchen docusate sodium (COLACE) 100 MG capsule Take 100 mg by mouth at bedtime.    . Melatonin 5 MG CAPS Take 10 mg by mouth at bedtime.     Marland Kitchen omeprazole (PRILOSEC) 20 MG capsule Take 20 mg by mouth every evening.    Marland Kitchen oxyCODONE-acetaminophen (PERCOCET) 10-325 MG per tablet Take 0.5-1 tablets by mouth daily as needed for pain (for migraine pain). migraines    . sacubitril-valsartan (ENTRESTO) 24-26 MG Take 1 tablet by mouth 2 (two) times daily. 60 tablet 11  . spironolactone (ALDACTONE) 25 MG tablet TAKE ONE TABLET (25MG  TOTAL) BY MOUTH DAILY 90 tablet 3   No current facility-administered medications for this visit.     Past Medical History:  Diagnosis Date  . Chronic neck pain   . Coronary artery disease    a. 11/2017: s/p anterolateral STEMI with cath showing severe thrombotic occlusion of proximal-LAD --> PCI/DES performed  . Essential hypertension   . Headache(784.0)   . Hypercholesteremia   . ICD (implantable  cardioverter-defibrillator) in place    Medtronic, September 2020 - Dr. Lovena Le  . Ischemic cardiomyopathy   . Migraine   . Muscle spasms of neck   . Myocardial infarction ([place])     ROS:   All systems reviewed and negative except as noted in the HPI.   Past Surgical History:  Procedure Laterality Date  . CHOLECYSTECTOMY     1/12  . COLONOSCOPY  03/15/2011   Procedure: COLONOSCOPY;  Surgeon: Rogene Houston, MD;  [facility];  Service: Endoscopy;  Laterality: N/A;  9:30 am  . CORONARY STENT INTERVENTION N/A 12/03/2017   Procedure: CORONARY STENT INTERVENTION;  Surgeon: Sherren Mocha, MD;  Location: [place] CV LAB;  Service: Cardiovascular;  Laterality: N/A;  . ESOPHAGOGASTRODUODENOSCOPY N/A 06/26/2012   Procedure: ESOPHAGOGASTRODUODENOSCOPY (EGD);  Surgeon: Rogene Houston, MD;  [facility];  Service: Endoscopy;  Laterality: N/A;  . fractured jaw    . fractured nose    . ICD IMPLANT N/A 11/27/2018   Procedure: ICD IMPLANT;  Surgeon: Evans Lance, MD;  Location: [place] CV LAB;  Service: Cardiovascular;  Laterality: N/A;  . left acl repair    . LEFT HEART CATH AND CORONARY ANGIOGRAPHY N/A 12/03/2017   Procedure: LEFT HEART CATH AND CORONARY ANGIOGRAPHY;  Surgeon:  [name], MD;  Location: [facility] INVASIVE CV LAB;  Service: Cardiovascular;  Laterality: N/A;  . Left Knee Arthroscopy    . TIBIA FRACTURE SURGERY       Family History  Problem Relation Age of Onset  . Cancer Mother   . Colon cancer Neg Hx   . Heart disease Neg Hx      Social History   Socioeconomic History  . Marital status: Married    Spouse name: Joy  . Number of children: Not on file  . Years of education: Not on file  . Highest education level: Not on file  Occupational History  . Occupation: Former TEFL teacher   Tobacco Use  . Smoking status: Never Smoker  . Smokeless tobacco: Never Used  Substance and Sexual Activity  . Alcohol use: No  . Drug use: No  .  Sexual activity: Not on file  Other Topics Concern  . Not on file  Social History Narrative  . Not on file   Social Determinants of Health   Financial Resource Strain:   . Difficulty of Paying Living Expenses: Not on file  Food Insecurity:   . Worried About Programme researcher, broadcasting/film/video in the Last Year: Not on file  . Ran Out of Food in the Last Year: Not on file  Transportation Needs:   . Lack of Transportation (Medical): Not on file  . Lack of Transportation (Non-Medical): Not on file  Physical Activity:   . Days of Exercise per Week: Not on file  . Minutes of Exercise per Session: Not on file  Stress:   . Feeling of Stress : Not on file  Social Connections:   . Frequency of Communication with Friends and Family: Not on file  . Frequency of Social Gatherings with Friends and Family: Not on file  . Attends Religious Services: Not on file  . Active Member of Clubs or Organizations: Not on file  . Attends Banker Meetings: Not on file  . Marital Status: Not on file  Intimate Partner Violence:   . Fear of Current or Ex-Partner: Not on file  . Emotionally Abused: Not on file  . Physically Abused: Not on file  . Sexually Abused: Not on file     BP 124/68   Pulse 67   Ht 6' (1.829 m)   Wt 207 lb (93.9 kg)   SpO2 97%   BMI 28.07 kg/m   Physical Exam:  Well appearing NAD HEENT: Unremarkable Neck:  No JVD, no thyromegally Lymphatics:  No adenopathy Back:  No CVA tenderness Lungs:  Clear with no wheezes; well healed ICD incision HEART:  Regular rate rhythm, no murmurs, no rubs, no clicks Abd:  soft, positive bowel sounds, no organomegally, no rebound, no guarding Ext:  2 plus pulses, no edema, no cyanosis, no clubbing Skin:  No rashes no nodules Neuro:  CN II through XII intact, motor grossly intact  DEVICE  Normal device function.  See PaceArt for details.   Assess/Plan: 1. Chronic systolic heart failure -His symptoms remain class 2. He will continue his  current meds. 2. CAD - he denies anginal symptoms. He is encouraged to increase his activity. 3. ICD - his medtronic single chamber ICD is working normally. We will recheck in several months.   Leonia Reeves.D.

## 2019-03-20 ENCOUNTER — Other Ambulatory Visit: Payer: Self-pay | Admitting: Cardiology

## 2019-04-02 DIAGNOSIS — G43009 Migraine without aura, not intractable, without status migrainosus: Secondary | ICD-10-CM | POA: Diagnosis not present

## 2019-05-08 ENCOUNTER — Other Ambulatory Visit: Payer: Self-pay

## 2019-05-08 ENCOUNTER — Encounter: Payer: Self-pay | Admitting: Podiatry

## 2019-05-08 ENCOUNTER — Other Ambulatory Visit: Payer: Self-pay | Admitting: Podiatry

## 2019-05-08 ENCOUNTER — Ambulatory Visit (INDEPENDENT_AMBULATORY_CARE_PROVIDER_SITE_OTHER): Payer: Medicare PPO

## 2019-05-08 ENCOUNTER — Ambulatory Visit: Payer: Medicare PPO | Admitting: Podiatry

## 2019-05-08 VITALS — Temp 97.2°F

## 2019-05-08 DIAGNOSIS — Z7901 Long term (current) use of anticoagulants: Secondary | ICD-10-CM | POA: Diagnosis not present

## 2019-05-08 DIAGNOSIS — M21619 Bunion of unspecified foot: Secondary | ICD-10-CM

## 2019-05-08 DIAGNOSIS — L84 Corns and callosities: Secondary | ICD-10-CM | POA: Diagnosis not present

## 2019-05-08 DIAGNOSIS — M21612 Bunion of left foot: Secondary | ICD-10-CM

## 2019-05-08 DIAGNOSIS — M79672 Pain in left foot: Secondary | ICD-10-CM | POA: Diagnosis not present

## 2019-05-08 NOTE — Progress Notes (Signed)
Subjective:   Patient ID: Thomas Black., male   DOB: 79 y.o.   MRN: [id number]   HPI 79 year old male presents the office today for concerns of a painful callus on the side of his left bunion.  Is been worsening over the last couple months.  He states he had a heart attack last year he has increased his walking which aggravates his symptoms.  He wears hiking shoes with his daily walks.  Denies any drainage or pus.  He gets some occasional sharp pain that goes at the side of the toe at times with shoe gear.  He said no recent treatment.  He has no other concerns.   Review of Systems  All other systems reviewed and are negative.  Past Medical History:  Diagnosis Date  . Chronic neck pain   . Coronary artery disease    a. 11/2017: s/p anterolateral STEMI with cath showing severe thrombotic occlusion of proximal-LAD --> PCI/DES performed  . Essential hypertension   . Headache(784.0)   . Hypercholesteremia   . ICD (implantable cardioverter-defibrillator) in place    Medtronic, September 2020 - Dr. [name]  . Ischemic cardiomyopathy   . Migraine   . Muscle spasms of neck   . Myocardial infarction [facility])     Past Surgical History:  Procedure Laterality Date  . CHOLECYSTECTOMY     1/12  . COLONOSCOPY  03/15/2011   Procedure: COLONOSCOPY;  Surgeon: [name], MD;  [facility];  Service: Endoscopy;  Laterality: N/A;  9:30 am  . CORONARY STENT INTERVENTION N/A 12/03/2017   Procedure: CORONARY STENT INTERVENTION;  Surgeon: [name], MD;  Location: [facility] INVASIVE CV LAB;  Service: Cardiovascular;  Laterality: N/A;  . ESOPHAGOGASTRODUODENOSCOPY N/A 06/26/2012   Procedure: ESOPHAGOGASTRODUODENOSCOPY (EGD);  Surgeon: [name], MD;  [facility];  Service: Endoscopy;  Laterality: N/A;  . fractured jaw    . fractured nose    . ICD IMPLANT N/A 11/27/2018   Procedure: ICD IMPLANT;  Surgeon: [name], MD;  Location: [facility] INVASIVE CV LAB;  Service:  Cardiovascular;  Laterality: N/A;  . left acl repair    . LEFT HEART CATH AND CORONARY ANGIOGRAPHY N/A 12/03/2017   Procedure: LEFT HEART CATH AND CORONARY ANGIOGRAPHY;  Surgeon: [name], MD;  Location: [facility] INVASIVE CV LAB;  Service: Cardiovascular;  Laterality: N/A;  . Left Knee Arthroscopy    . TIBIA FRACTURE SURGERY       Current Outpatient Medications:  .  acetaminophen (TYLENOL) 325 MG tablet, Take 650 mg by mouth every 6 (six) hours as needed for mild pain., Disp: , Rfl:  .  aspirin 81 MG EC tablet, Take 1 tablet (81 mg total) by mouth daily., Disp: 30 tablet, Rfl: 0 .  atorvastatin (LIPITOR) 80 MG tablet, TAKE ONE TABLET (80MG  TOTAL) BY MOUTH DAILY AT 6PM, Disp: 90 tablet, Rfl: 3 .  carvedilol (COREG) 6.25 MG tablet, TAKE ONE TABLET (6.25MG  TOTAL) BY MOUTH TWO TIMES DAILY WITH A MEAL, Disp: 180 tablet, Rfl: 3 .  clopidogrel (PLAVIX) 75 MG tablet, Take 1 tablet (75 mg total) by mouth daily., Disp: 90 tablet, Rfl: 3 .  cyclobenzaprine (FLEXERIL) 10 MG tablet, Take 10 mg by mouth daily as needed for muscle spasms. , Disp: , Rfl:  .  docusate sodium (COLACE) 100 MG capsule, Take 100 mg by mouth at bedtime., Disp: , Rfl:  .  ENTRESTO 24-26 MG, TAKE ONE TABLET BY MOUTH TWICE A DAY, Disp: 60 tablet,  Rfl: 11 .  Melatonin 5 MG CAPS, Take 10 mg by mouth at bedtime. , Disp: , Rfl:  .  omeprazole (PRILOSEC) 20 MG capsule, Take 20 mg by mouth every evening., Disp: , Rfl:  .  oxyCODONE-acetaminophen (PERCOCET) 10-325 MG per tablet, Take 0.5-1 tablets by mouth daily as needed for pain (for migraine pain). migraines, Disp: , Rfl:  .  spironolactone (ALDACTONE) 25 MG tablet, TAKE ONE TABLET (25MG  TOTAL) BY MOUTH DAILY, Disp: 90 tablet, Rfl: 3  No Known Allergies       Objective:  Physical Exam  General: AAO x3, NAD  Dermatological: Hyperkeratotic lesion medial aspect left first metatarsal head on the area of bunion without any underlying ulceration, drainage or any signs of infection.  No  open lesions identified otherwise.  Vascular: Dorsalis Pedis artery and Posterior Tibial artery pedal pulses are 2/4 bilateral with immedate capillary fill time.  There is no pain with calf compression, swelling, warmth, erythema.   Neruologic: Grossly intact via light touch bilateral.  Protective threshold with Semmes Wienstein monofilament intact to all pedal sites bilateral.   Musculoskeletal: Bunion deformities present resulting in a hyperkeratotic lesion along the medial hallux.  Muscular strength 5/5 in all groups tested bilateral.  Gait: Unassisted, Nonantalgic.       Assessment:   Bunion deformity resulted in hyperkeratotic lesion left foot     Plan:  -Treatment options discussed including all alternatives, risks, and complications -Etiology of symptoms were discussed -X-rays were obtained and reviewed with the patient.  Bunion deformities present with midfoot arthritic changes present no evidence of acute fracture. -Debrided the hyperkeratotic lesion without any complications or bleeding.  Recommend moisturizer daily.  Dispensed offloading pads.  Discussed shoe modifications to avoid pressure.  Wants to hold off on any surgical intervention.  Return if symptoms worsen or fail to improve.  Trula Slade DPM

## 2019-05-26 ENCOUNTER — Ambulatory Visit (INDEPENDENT_AMBULATORY_CARE_PROVIDER_SITE_OTHER): Payer: Medicare PPO | Admitting: *Deleted

## 2019-05-26 DIAGNOSIS — Z95810 Presence of automatic (implantable) cardiac defibrillator: Secondary | ICD-10-CM | POA: Diagnosis not present

## 2019-05-26 LAB — CUP PACEART REMOTE DEVICE CHECK
Battery Remaining Longevity: 124 mo
Battery Voltage: 3.07 V
Brady Statistic AP VP Percent: 0.03 %
Brady Statistic AP VS Percent: 43.24 %
Brady Statistic AS VP Percent: 0.03 %
Brady Statistic AS VS Percent: 56.71 %
Brady Statistic RA Percent Paced: 43.24 %
Brady Statistic RV Percent Paced: 0.06 %
Date Time Interrogation Session: 20210329201918
HighPow Impedance: 68 Ohm
Implantable Lead Implant Date: 20200930
Implantable Lead Implant Date: 20200930
Implantable Lead Location: 753859
Implantable Lead Location: 753860
Implantable Lead Model: 5076
Implantable Lead Model: 6935
Implantable Pulse Generator Implant Date: 20200930
Lead Channel Impedance Value: 304 Ohm
Lead Channel Impedance Value: 418 Ohm
Lead Channel Impedance Value: 475 Ohm
Lead Channel Pacing Threshold Amplitude: 0.75 V
Lead Channel Pacing Threshold Amplitude: 1 V
Lead Channel Pacing Threshold Pulse Width: 0.4 ms
Lead Channel Pacing Threshold Pulse Width: 0.4 ms
Lead Channel Sensing Intrinsic Amplitude: 19 mV
Lead Channel Sensing Intrinsic Amplitude: 19 mV
Lead Channel Sensing Intrinsic Amplitude: 2.375 mV
Lead Channel Sensing Intrinsic Amplitude: 2.375 mV
Lead Channel Setting Pacing Amplitude: 2 V
Lead Channel Setting Pacing Amplitude: 2.5 V
Lead Channel Setting Pacing Pulse Width: 0.4 ms
Lead Channel Setting Sensing Sensitivity: 0.3 mV

## 2019-05-27 NOTE — Progress Notes (Signed)
ICD Remote  

## 2019-06-06 ENCOUNTER — Ambulatory Visit (INDEPENDENT_AMBULATORY_CARE_PROVIDER_SITE_OTHER): Payer: Medicare PPO | Admitting: *Deleted

## 2019-06-06 DIAGNOSIS — Z95810 Presence of automatic (implantable) cardiac defibrillator: Secondary | ICD-10-CM | POA: Diagnosis not present

## 2019-06-06 LAB — CUP PACEART REMOTE DEVICE CHECK
Battery Remaining Longevity: 123 mo
Battery Voltage: 3.06 V
Brady Statistic AP VP Percent: 0.03 %
Brady Statistic AP VS Percent: 43.32 %
Brady Statistic AS VP Percent: 0.04 %
Brady Statistic AS VS Percent: 56.61 %
Brady Statistic RA Percent Paced: 43.35 %
Brady Statistic RV Percent Paced: 0.06 %
Date Time Interrogation Session: 20210409072507
HighPow Impedance: 68 Ohm
Implantable Lead Implant Date: 20200930
Implantable Lead Implant Date: 20200930
Implantable Lead Location: 753859
Implantable Lead Location: 753860
Implantable Lead Model: 5076
Implantable Lead Model: 6935
Implantable Pulse Generator Implant Date: 20200930
Lead Channel Impedance Value: 304 Ohm
Lead Channel Impedance Value: 399 Ohm
Lead Channel Impedance Value: 475 Ohm
Lead Channel Pacing Threshold Amplitude: 0.75 V
Lead Channel Pacing Threshold Amplitude: 1 V
Lead Channel Pacing Threshold Pulse Width: 0.4 ms
Lead Channel Pacing Threshold Pulse Width: 0.4 ms
Lead Channel Sensing Intrinsic Amplitude: 18 mV
Lead Channel Sensing Intrinsic Amplitude: 18 mV
Lead Channel Sensing Intrinsic Amplitude: 2.625 mV
Lead Channel Sensing Intrinsic Amplitude: 2.625 mV
Lead Channel Setting Pacing Amplitude: 2 V
Lead Channel Setting Pacing Amplitude: 2.5 V
Lead Channel Setting Pacing Pulse Width: 0.4 ms
Lead Channel Setting Sensing Sensitivity: 0.3 mV

## 2019-06-06 NOTE — Progress Notes (Signed)
ICD Remote  

## 2019-07-02 DIAGNOSIS — I2510 Atherosclerotic heart disease of native coronary artery without angina pectoris: Secondary | ICD-10-CM | POA: Diagnosis not present

## 2019-07-02 DIAGNOSIS — I255 Ischemic cardiomyopathy: Secondary | ICD-10-CM | POA: Diagnosis not present

## 2019-07-02 DIAGNOSIS — Z6827 Body mass index (BMI) 27.0-27.9, adult: Secondary | ICD-10-CM | POA: Diagnosis not present

## 2019-07-14 NOTE — Progress Notes (Signed)
Cardiology Office Note  Date: 07/15/2019   ID: Thomas Black., DOB 10/28/1940, MRN [id number]  PCP:  [name], MD  Cardiologist:  [name], MD Electrophysiologist:  [name], MD   Chief Complaint  Patient presents with  . Cardiac follow-up    History of Present Illness: Thomas Black. is a 79 y.o. male last seen in November 2020.  He presents for a routine visit.  States that he has been doing well overall, no angina symptoms, no palpitations or syncope, NYHA class II dyspnea.  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  He sees Dr. [name], Medtronic ICD in place.  Device check in April indicated normal function.  No device shocks or syncope.  I reviewed his medications which are outlined below.  He is tolerating his current medical regimen well, tends to run a low blood pressure but is not symptomatic with this.  His last echocardiogram was in August 2020, we will get an updated study prior to his next visit.  He also plans to see Dr. [name] later this year for follow-up lab work.  Past Medical History:  Diagnosis Date  . Chronic neck pain   . Coronary artery disease    a. 11/2017: s/p anterolateral STEMI with cath showing severe thrombotic occlusion of proximal-LAD --> PCI/DES performed  . Essential hypertension   . Headache(784.0)   . Hypercholesteremia   . ICD (implantable cardioverter-defibrillator) in place    Medtronic, September 2020 - Dr. [name]  . Ischemic cardiomyopathy   . Migraine   . Muscle spasms of neck   . Myocardial infarction [facility])     Past Surgical History:  Procedure Laterality Date  . CHOLECYSTECTOMY     1/12  . COLONOSCOPY  03/15/2011   Procedure: COLONOSCOPY;  Surgeon: [name], MD;  [facility];  Service: Endoscopy;  Laterality: N/A;  9:30 am  . CORONARY STENT INTERVENTION N/A 12/03/2017   Procedure: CORONARY STENT INTERVENTION;  Surgeon: [name], MD;  Location:  [facility] INVASIVE CV LAB;  Service: Cardiovascular;  Laterality: N/A;  . ESOPHAGOGASTRODUODENOSCOPY N/A 06/26/2012   Procedure: ESOPHAGOGASTRODUODENOSCOPY (EGD);  Surgeon: [name], MD;  [facility];  Service: Endoscopy;  Laterality: N/A;  . fractured jaw    . fractured nose    . ICD IMPLANT N/A 11/27/2018   Procedure: ICD IMPLANT;  Surgeon: [name], MD;  Location: [facility] INVASIVE CV LAB;  Service: Cardiovascular;  Laterality: N/A;  . left acl repair    . LEFT HEART CATH AND CORONARY ANGIOGRAPHY N/A 12/03/2017   Procedure: LEFT HEART CATH AND CORONARY ANGIOGRAPHY;  Surgeon: [name], MD;  Location: [facility] INVASIVE CV LAB;  Service: Cardiovascular;  Laterality: N/A;  . Left Knee Arthroscopy    . TIBIA FRACTURE SURGERY      Current Outpatient Medications  Medication Sig Dispense Refill  . acetaminophen (TYLENOL) 325 MG tablet Take 650 mg by mouth every 6 (six) hours as needed for mild pain.    Marland Kitchen aspirin 81 MG EC tablet Take 1 tablet (81 mg total) by mouth daily. 30 tablet 0  . atorvastatin (LIPITOR) 80 MG tablet TAKE ONE TABLET (80MG  TOTAL) BY MOUTH DAILY AT 6PM 90 tablet 3  . carvedilol (COREG) 6.25 MG tablet TAKE ONE TABLET (6.25MG  TOTAL) BY MOUTH TWO TIMES DAILY WITH A MEAL 180 tablet 3  . clopidogrel (PLAVIX) 75 MG tablet Take 1 tablet (75 mg total) by mouth  daily. 90 tablet 3  . cyclobenzaprine (FLEXERIL) 10 MG tablet Take 10 mg by mouth daily as needed for muscle spasms.     Marland Kitchen docusate sodium (COLACE) 100 MG capsule Take 100 mg by mouth at bedtime.    Marland Kitchen ENTRESTO 24-26 MG TAKE ONE TABLET BY MOUTH TWICE A DAY 60 tablet 11  . Melatonin 5 MG CAPS Take 10 mg by mouth at bedtime.     Marland Kitchen omeprazole (PRILOSEC) 20 MG capsule Take 20 mg by mouth every evening.    Marland Kitchen oxyCODONE-acetaminophen (PERCOCET) 10-325 MG per tablet Take 0.5-1 tablets by mouth daily as needed for pain (for migraine pain). migraines    . spironolactone (ALDACTONE) 25 MG tablet TAKE ONE TABLET (25MG  TOTAL) BY  MOUTH DAILY 90 tablet 3   No current facility-administered medications for this visit.   Allergies:  Patient has no known allergies.   ROS:  No orthopnea or PND.  Physical Exam: VS:  BP 90/60   Pulse (!) 59   Ht 6' (1.829 m)   Wt 209 lb (94.8 kg)   BMI 28.35 kg/m , BMI Body mass index is 28.35 kg/m.  Wt Readings from Last 3 Encounters:  07/15/19 209 lb (94.8 kg)  03/04/19 207 lb (93.9 kg)  12/31/18 207 lb (93.9 kg)    General: Elderly male, appears comfortable at rest. HEENT: Conjunctiva and lids normal, wearing a mask. Neck: Supple, no elevated JVP or carotid bruits, no thyromegaly. Lungs: Clear to auscultation, nonlabored breathing at rest. Cardiac: Regular rate and rhythm, no S3 or significant systolic murmur. Extremities: No pitting edema, distal pulses 2+.  ECG:  An ECG dated 11/28/2018 was personally reviewed today and demonstrated:  Sinus rhythm with low voltage and old anterior infarct pattern.  Recent Labwork: 11/25/2018: BUN 13; Creatinine, Ser 1.07; Hemoglobin 15.1; Platelets 254; Potassium 4.3; Sodium 138     Component Value Date/Time   CHOL 200 12/04/2017 0437   TRIG 163 (H) 12/04/2017 0437   HDL 60 12/04/2017 0437   CHOLHDL 3.3 12/04/2017 0437   VLDL 33 12/04/2017 0437   LDLCALC 107 (H) 12/04/2017 0437    Other Studies Reviewed Today:  Echocardiogram 10/03/2018: 1. Stage 1: 1: Mid and apical anterior wall, mid and apical anterior septum, and apex are abnormal. 2. The left ventricle has moderate-severely reduced systolic function, with an ejection fraction of 30-35%. The cavity size was normal. Mild posterior wall thickening. Left ventricular diastolic Doppler parameters are consistent with impaired  relaxation. Left ventricular diffuse hypokinesis. 3. The right ventricle has normal systolic function. The cavity was normal. There is no increase in right ventricular wall thickness. 4. The aortic valve is tricuspid. Mild thickening of the aortic valve.  Aortic valve regurgitation is trivial by color flow Doppler. Mild to moderate aortic annular calcification noted. 5. The mitral valve is grossly normal. 6. The tricuspid valve is grossly normal. 7. The aorta is normal in size and structure.  Assessment and Plan:  1.  Ischemic cardiomyopathy with LVEF 30 to 35%.  He is doing well in terms of chronic systolic heart failure symptoms, no significant fluid overload on examination.  Continue Coreg, Entresto, and Aldactone.  He will have lab work with PCP prior to next visit we will also get a repeat echocardiogram.  2.  CAD status post anterolateral STEMI in October 2019.  He is status post DES to the proximal LAD at that time and is doing well without active angina symptoms.  Continue aspirin, Plavix, and statin therapy.  3.  Mixed hyperlipidemia, he continues on Lipitor.  He will have repeat lab work with PCP prior to next visit.  Medication Adjustments/Labs and Tests Ordered: Current medicines are reviewed at length with the patient today.  Concerns regarding medicines are outlined above.   Tests Ordered: Orders Placed This Encounter  Procedures  . ECHOCARDIOGRAM COMPLETE    Medication Changes: No orders of the defined types were placed in this encounter.   Disposition:  Follow up 6 months in the [place] office.  Signed, Jonelle Sidle, MD, [facility] 07/15/2019 8:45 AM    [facility] at [facility] [address], [place], [place] [address] Phone: [phone]; Fax: [phone]

## 2019-07-15 ENCOUNTER — Ambulatory Visit (INDEPENDENT_AMBULATORY_CARE_PROVIDER_SITE_OTHER): Payer: Medicare PPO | Admitting: Cardiology

## 2019-07-15 ENCOUNTER — Other Ambulatory Visit: Payer: Self-pay

## 2019-07-15 ENCOUNTER — Encounter: Payer: Self-pay | Admitting: Cardiology

## 2019-07-15 VITALS — BP 90/60 | HR 59 | Ht 72.0 in | Wt 209.0 lb

## 2019-07-15 DIAGNOSIS — E782 Mixed hyperlipidemia: Secondary | ICD-10-CM | POA: Diagnosis not present

## 2019-07-15 DIAGNOSIS — I25119 Atherosclerotic heart disease of native coronary artery with unspecified angina pectoris: Secondary | ICD-10-CM

## 2019-07-15 DIAGNOSIS — I255 Ischemic cardiomyopathy: Secondary | ICD-10-CM

## 2019-07-15 NOTE — Patient Instructions (Signed)
Medication Instructions:  Your physician recommends that you continue on your current medications as directed. Please refer to the Current Medication list given to you today.  *If you need a refill on your cardiac medications before your next appointment, please call your pharmacy*   Lab Work: None today If you have labs (blood work) drawn today and your tests are completely normal, you will receive your results only by: Marland Kitchen MyChart Message (if you have MyChart) OR . A paper copy in the mail If you have any lab test that is abnormal or we need to change your treatment, we will call you to review the results.   Testing/Procedures: Your physician has requested that you have an echocardiogram in [place]. Echocardiography is a painless test that uses sound waves to create images of your heart. It provides your doctor with information about the size and shape of your heart and how well your heart's chambers and valves are working. This procedure takes approximately one hour. There are no restrictions for this procedure.     Follow-Up: At [facility], you and your health needs are our priority.  As part of our continuing mission to provide you with exceptional heart care, we have created designated Provider Care Teams.  These Care Teams include your primary Cardiologist (physician) and Advanced Practice Providers (APPs -  Physician Assistants and Nurse Practitioners) who all work together to provide you with the care you need, when you need it.  We recommend signing up for the patient portal called "MyChart".  Sign up information is provided on this After Visit Summary.  MyChart is used to connect with patients for Virtual Visits (Telemedicine).  Patients are able to view lab/test results, encounter notes, upcoming appointments, etc.  Non-urgent messages can be sent to your provider as well.   To learn more about what you can do with MyChart, go to [email or web address].    Your next  appointment:   6 month(s)  The format for your next appointment:   In Person  Provider:   [name], MD   Other Instructions None        Thank you for choosing [facility] !

## 2019-08-01 ENCOUNTER — Telehealth: Payer: Self-pay | Admitting: Cardiology

## 2019-08-01 NOTE — Telephone Encounter (Signed)
   Primary Cardiologist: [name], MD  Chart reviewed as part of pre-operative protocol coverage.   Simple dental extractions are considered low risk procedures per guidelines and generally do not require any specific cardiac clearance. It is also generally accepted that for simple extractions and dental cleanings, there is no need to interrupt blood thinner therapy.  SBE prophylaxis is/is not required for the patient.  I will route this recommendation to the requesting party via Epic fax function and remove from pre-op pool.  Please call with questions.  Laverda Page, NP 08/01/2019, 9:59 AM

## 2019-08-01 NOTE — Telephone Encounter (Signed)
   [facility] Pre-operative Risk Assessment    HEARTCARE STAFF: - Please ensure there is not already an duplicate clearance open for this procedure. - Under Visit Info/Reason for Call, type in Other and utilize the format Clearance MM/DD/YY or Clearance TBD. Do not use dashes or single digits. - If request is for dental extraction, please clarify the # of teeth to be extracted.  Request for surgical clearance:  1. What type of surgery is being performed? Needing #2 and #15 extracted   2. When is this surgery scheduled? 08/05/2019  3. What type of clearance is required (medical clearance vs. Pharmacy clearance to hold med vs. Both)?  Pharmacy  4. Are there any medications that need to be held prior to surgery and how long? Please advise on Plavix Medication and should he stop taking it before surgery  5. Practice name and name of physician performing surgery? [facility]  6. What is the office phone number?    7.   What is the office fax number? [phone]  8.   Anesthesia type (None, local, MAC, general) ? Did not state   Desma Paganini 08/01/2019, 8:03 AM  _________________________________________________________________   (provider comments below)

## 2019-09-05 ENCOUNTER — Ambulatory Visit (INDEPENDENT_AMBULATORY_CARE_PROVIDER_SITE_OTHER): Payer: Medicare PPO | Admitting: *Deleted

## 2019-09-05 DIAGNOSIS — I255 Ischemic cardiomyopathy: Secondary | ICD-10-CM | POA: Diagnosis not present

## 2019-09-05 LAB — CUP PACEART REMOTE DEVICE CHECK
Battery Remaining Longevity: 121 mo
Battery Voltage: 3.04 V
Brady Statistic AP VP Percent: 0.04 %
Brady Statistic AP VS Percent: 51.28 %
Brady Statistic AS VP Percent: 0.02 %
Brady Statistic AS VS Percent: 48.66 %
Brady Statistic RA Percent Paced: 51.17 %
Brady Statistic RV Percent Paced: 0.06 %
Date Time Interrogation Session: 20210709063523
HighPow Impedance: 70 Ohm
Implantable Lead Implant Date: 20200930
Implantable Lead Implant Date: 20200930
Implantable Lead Location: 753859
Implantable Lead Location: 753860
Implantable Lead Model: 5076
Implantable Lead Model: 6935
Implantable Pulse Generator Implant Date: 20200930
Lead Channel Impedance Value: 304 Ohm
Lead Channel Impedance Value: 399 Ohm
Lead Channel Impedance Value: 475 Ohm
Lead Channel Pacing Threshold Amplitude: 0.625 V
Lead Channel Pacing Threshold Amplitude: 0.875 V
Lead Channel Pacing Threshold Pulse Width: 0.4 ms
Lead Channel Pacing Threshold Pulse Width: 0.4 ms
Lead Channel Sensing Intrinsic Amplitude: 17.125 mV
Lead Channel Sensing Intrinsic Amplitude: 17.125 mV
Lead Channel Sensing Intrinsic Amplitude: 2.25 mV
Lead Channel Sensing Intrinsic Amplitude: 2.25 mV
Lead Channel Setting Pacing Amplitude: 2 V
Lead Channel Setting Pacing Amplitude: 2.5 V
Lead Channel Setting Pacing Pulse Width: 0.4 ms
Lead Channel Setting Sensing Sensitivity: 0.3 mV

## 2019-09-09 NOTE — Progress Notes (Signed)
Remote ICD transmission.   

## 2019-10-13 ENCOUNTER — Other Ambulatory Visit: Payer: Self-pay | Admitting: Cardiology

## 2019-11-06 ENCOUNTER — Ambulatory Visit: Payer: Medicare PPO | Attending: Internal Medicine

## 2019-11-06 DIAGNOSIS — Z23 Encounter for immunization: Secondary | ICD-10-CM

## 2019-11-06 NOTE — Progress Notes (Signed)
   [facility]  Name:  Thomas Black.    MRN: [id number] DOB: [birth year]  11/06/2019  Thomas Black was observed post Covid-19 immunization for 15 minutes without incident. He was provided with Vaccine Information Sheet and instruction to access the V-Safe system.   Thomas Black was instructed to call 911 with any severe reactions post vaccine: Marland Kitchen Difficulty breathing  . Swelling of face and throat  . A fast heartbeat  . A bad rash all over body  . Dizziness and weakness

## 2019-12-05 ENCOUNTER — Ambulatory Visit (INDEPENDENT_AMBULATORY_CARE_PROVIDER_SITE_OTHER): Payer: Medicare PPO

## 2019-12-05 DIAGNOSIS — Z95810 Presence of automatic (implantable) cardiac defibrillator: Secondary | ICD-10-CM

## 2019-12-08 LAB — CUP PACEART REMOTE DEVICE CHECK
Battery Remaining Longevity: 117 mo
Battery Voltage: 3.02 V
Brady Statistic AP VP Percent: 0.03 %
Brady Statistic AP VS Percent: 54.14 %
Brady Statistic AS VP Percent: 0.02 %
Brady Statistic AS VS Percent: 45.81 %
Brady Statistic RA Percent Paced: 54 %
Brady Statistic RV Percent Paced: 0.05 %
Date Time Interrogation Session: 20211010061709
HighPow Impedance: 70 Ohm
Implantable Lead Implant Date: 20200930
Implantable Lead Implant Date: 20200930
Implantable Lead Location: 753859
Implantable Lead Location: 753860
Implantable Lead Model: 5076
Implantable Lead Model: 6935
Implantable Pulse Generator Implant Date: 20200930
Lead Channel Impedance Value: 342 Ohm
Lead Channel Impedance Value: 399 Ohm
Lead Channel Impedance Value: 475 Ohm
Lead Channel Pacing Threshold Amplitude: 0.75 V
Lead Channel Pacing Threshold Amplitude: 1 V
Lead Channel Pacing Threshold Pulse Width: 0.4 ms
Lead Channel Pacing Threshold Pulse Width: 0.4 ms
Lead Channel Sensing Intrinsic Amplitude: 18 mV
Lead Channel Sensing Intrinsic Amplitude: 18 mV
Lead Channel Sensing Intrinsic Amplitude: 2.5 mV
Lead Channel Sensing Intrinsic Amplitude: 2.5 mV
Lead Channel Setting Pacing Amplitude: 2 V
Lead Channel Setting Pacing Amplitude: 2.5 V
Lead Channel Setting Pacing Pulse Width: 0.4 ms
Lead Channel Setting Sensing Sensitivity: 0.3 mV

## 2019-12-09 NOTE — Progress Notes (Signed)
Remote ICD transmission.   

## 2019-12-29 ENCOUNTER — Ambulatory Visit: Payer: Medicare PPO | Admitting: Cardiology

## 2019-12-29 ENCOUNTER — Other Ambulatory Visit: Payer: Self-pay

## 2019-12-29 ENCOUNTER — Ambulatory Visit (HOSPITAL_COMMUNITY)
Admission: RE | Admit: 2019-12-29 | Discharge: 2019-12-29 | Disposition: A | Discharge status: Home / Self Care | Payer: Medicare PPO | Source: Ambulatory Visit | Attending: Cardiology | Admitting: Cardiology

## 2019-12-29 DIAGNOSIS — I255 Ischemic cardiomyopathy: Secondary | ICD-10-CM | POA: Diagnosis not present

## 2019-12-29 LAB — ECHOCARDIOGRAM COMPLETE
AR max vel: 2.2 cm2
AV Area VTI: 2.53 cm2
AV Area mean vel: 2.19 cm2
AV Mean grad: 3.7 mmHg
AV Peak grad: 6.7 mmHg
Ao pk vel: 1.29 m/s
Area-P 1/2: 1.7 cm2
S' Lateral: 3.27 cm

## 2019-12-29 NOTE — Progress Notes (Signed)
*  PRELIMINARY RESULTS* Echocardiogram 2D Echocardiogram has been performed.  Jeryl Columbia 12/29/2019, 1:07 PM

## 2019-12-31 ENCOUNTER — Other Ambulatory Visit: Payer: Self-pay | Admitting: Student

## 2019-12-31 ENCOUNTER — Other Ambulatory Visit: Payer: Self-pay | Admitting: Cardiology

## 2020-01-07 DIAGNOSIS — Z23 Encounter for immunization: Secondary | ICD-10-CM | POA: Diagnosis not present

## 2020-01-26 ENCOUNTER — Ambulatory Visit: Payer: Medicare PPO | Admitting: Cardiology

## 2020-01-26 NOTE — Progress Notes (Deleted)
Cardiology Office Note  Date: 01/26/2020   ID: Thomas Mink., DOB 1940/11/14, MRN [id number]  PCP:  [name], MD  Cardiologist:  [name], MD Electrophysiologist:  [name], MD   No chief complaint on file.   History of Present Illness: Thomas Brathwaite. is a 79 y.o. male last seen in May.  He follows with Dr. [name], Medtronic ICD in place.  Device interrogation in October revealed normal function.  Follow-up echocardiogram in November revealed LVEF 30 to 35% range with wall motion abnormalities consistent with ischemic cardiomyopathy, normal RV contraction.  Past Medical History:  Diagnosis Date  . Chronic neck pain   . Coronary artery disease    a. 11/2017: s/p anterolateral STEMI with cath showing severe thrombotic occlusion of proximal-LAD --> PCI/DES performed  . Essential hypertension   . Headache(784.0)   . Hypercholesteremia   . ICD (implantable cardioverter-defibrillator) in place    Medtronic, September 2020 - Dr. [name]  . Ischemic cardiomyopathy   . Migraine   . Muscle spasms of neck   . Myocardial infarction [facility])     Past Surgical History:  Procedure Laterality Date  . CHOLECYSTECTOMY     1/12  . COLONOSCOPY  03/15/2011   Procedure: COLONOSCOPY;  Surgeon: [name], MD;  [facility];  Service: Endoscopy;  Laterality: N/A;  9:30 am  . CORONARY STENT INTERVENTION N/A 12/03/2017   Procedure: CORONARY STENT INTERVENTION;  Surgeon: [name], MD;  Location: [facility] INVASIVE CV LAB;  Service: Cardiovascular;  Laterality: N/A;  . ESOPHAGOGASTRODUODENOSCOPY N/A 06/26/2012   Procedure: ESOPHAGOGASTRODUODENOSCOPY (EGD);  Surgeon: [name], MD;  [facility];  Service: Endoscopy;  Laterality: N/A;  . fractured jaw    . fractured nose    . ICD IMPLANT N/A 11/27/2018   Procedure: ICD IMPLANT;  Surgeon: [name], MD;  Location: [facility] INVASIVE CV LAB;  Service: Cardiovascular;  Laterality: N/A;  . left acl  repair    . LEFT HEART CATH AND CORONARY ANGIOGRAPHY N/A 12/03/2017   Procedure: LEFT HEART CATH AND CORONARY ANGIOGRAPHY;  Surgeon: [name], MD;  Location: [facility] INVASIVE CV LAB;  Service: Cardiovascular;  Laterality: N/A;  . Left Knee Arthroscopy    . TIBIA FRACTURE SURGERY      Current Outpatient Medications  Medication Sig Dispense Refill  . acetaminophen (TYLENOL) 325 MG tablet Take 650 mg by mouth every 6 (six) hours as needed for mild pain.    Marland Kitchen aspirin 81 MG EC tablet Take 1 tablet (81 mg total) by mouth daily. 30 tablet 0  . atorvastatin (LIPITOR) 80 MG tablet TAKE ONE TABLET (80MG  TOTAL) BY MOUTH DAILY AT 6PM 90 tablet 3  . carvedilol (COREG) 6.25 MG tablet TAKE ONE TABLET (6.25MG  TOTAL) BY MOUTH TWO TIMES DAILY WITH A MEAL 180 tablet 3  . clopidogrel (PLAVIX) 75 MG tablet TAKE ONE (1) TABLET BY MOUTH EVERY DAY 90 tablet 3  . cyclobenzaprine (FLEXERIL) 10 MG tablet Take 10 mg by mouth daily as needed for muscle spasms.     docusate sodium (COLACE) 100 MG capsule Take 100 mg by mouth at bedtime.    Marland Kitchen ENTRESTO 24-26 MG TAKE ONE TABLET BY MOUTH TWICE A DAY 60 tablet 11  . Melatonin 5 MG CAPS Take 10 mg by mouth at bedtime.     Marland Kitchen omeprazole (PRILOSEC) 20 MG capsule Take 20 mg by mouth every evening.    Marland Kitchen oxyCODONE-acetaminophen (PERCOCET) 10-325 MG per  tablet Take 0.5-1 tablets by mouth daily as needed for pain (for migraine pain). migraines    . spironolactone (ALDACTONE) 25 MG tablet TAKE ONE TABLET (25MG  TOTAL) BY MOUTH DAILY 90 tablet 3   No current facility-administered medications for this visit.   Allergies:  Patient has no allergy information on record.   Social History: The patient  reports that he has never smoked. He has never used smokeless tobacco. He reports that he does not drink alcohol and does not use drugs.   Family History: The patient's family history includes Cancer in his mother.   ROS:  Please see the history of present illness. Otherwise, complete  review of systems is positive for {NONE DEFAULTED:18576::"none"}.  All other systems are reviewed and negative.   Physical Exam: VS:  There were no vitals taken for this visit., BMI There is no height or weight on file to calculate BMI.  Wt Readings from Last 3 Encounters:  07/15/19 209 lb (94.8 kg)  03/04/19 207 lb (93.9 kg)  12/31/18 207 lb (93.9 kg)    General: Patient appears comfortable at rest. HEENT: Conjunctiva and lids normal, oropharynx clear with moist mucosa. Neck: Supple, no elevated JVP or carotid bruits, no thyromegaly. Lungs: Clear to auscultation, nonlabored breathing at rest. Cardiac: Regular rate and rhythm, no S3 or significant systolic murmur, no pericardial rub. Abdomen: Soft, nontender, no hepatomegaly, bowel sounds present, no guarding or rebound. Extremities: No pitting edema, distal pulses 2+. Skin: Warm and dry. Musculoskeletal: No kyphosis. Neuropsychiatric: Alert and oriented x3, affect grossly appropriate.  ECG:  An ECG dated 11/28/2018 was personally reviewed today and demonstrated:  Sinus rhythm with low voltage and old anterior infarct pattern.  Recent Labwork:  No interval lab work for review today.  Other Studies Reviewed Today:  Echocardiogram 12/29/2019: 1. The entire apex is akinetic. 13/02/2019 Left ventricular ejection fraction, by  estimation, is 30 to 35%. The left ventricle has moderately decreased  function. The left ventricle demonstrates regional wall motion  abnormalities (see scoring diagram/findings for  description). There is moderate left ventricular hypertrophy. Left  ventricular diastolic parameters are consistent with Grade I diastolic  dysfunction (impaired relaxation).  2. Right ventricular systolic function is normal. The right ventricular  size is normal.  3. The mitral valve is normal in structure. No evidence of mitral valve  regurgitation. No evidence of mitral stenosis.  4. The aortic valve is tricuspid. Aortic valve  regurgitation is mild. No  aortic stenosis is present.   Assessment and Plan:   Medication Adjustments/Labs and Tests Ordered: Current medicines are reviewed at length with the patient today.  Concerns regarding medicines are outlined above.   Tests Ordered: No orders of the defined types were placed in this encounter.   Medication Changes: No orders of the defined types were placed in this encounter.   Disposition:  Follow up {follow up:15908}  Signed, Marland Kitchen, MD, [facility] 01/26/2020 8:47 AM    [facility] at [facility] [address] [place], [place] Phone: [phone]; Fax: [phone]

## 2020-01-28 ENCOUNTER — Other Ambulatory Visit: Payer: Self-pay

## 2020-01-28 ENCOUNTER — Ambulatory Visit (INDEPENDENT_AMBULATORY_CARE_PROVIDER_SITE_OTHER): Payer: Medicare PPO | Admitting: Cardiology

## 2020-01-28 ENCOUNTER — Other Ambulatory Visit (HOSPITAL_COMMUNITY): Payer: Medicare PPO

## 2020-01-28 ENCOUNTER — Encounter: Payer: Self-pay | Admitting: Cardiology

## 2020-01-28 VITALS — BP 110/64 | HR 64 | Ht 73.0 in | Wt 211.0 lb

## 2020-01-28 DIAGNOSIS — I255 Ischemic cardiomyopathy: Secondary | ICD-10-CM

## 2020-01-28 DIAGNOSIS — E782 Mixed hyperlipidemia: Secondary | ICD-10-CM

## 2020-01-28 DIAGNOSIS — I25119 Atherosclerotic heart disease of native coronary artery with unspecified angina pectoris: Secondary | ICD-10-CM

## 2020-01-28 NOTE — Patient Instructions (Signed)
Medication Instructions:  °Your physician recommends that you continue on your current medications as directed. Please refer to the Current Medication list given to you today. ° °*If you need a refill on your cardiac medications before your next appointment, please call your pharmacy* ° ° °Lab Work: °None today °If you have labs (blood work) drawn today and your tests are completely normal, you will receive your results only by: °• MyChart Message (if you have MyChart) OR °• A paper copy in the mail °If you have any lab test that is abnormal or we need to change your treatment, we will call you to review the results. ° ° °Testing/Procedures: °None today ° ° °Follow-Up: °At [facility], you and your health needs are our priority.  As part of our continuing mission to provide you with exceptional heart care, we have created designated Provider Care Teams.  These Care Teams include your primary Cardiologist (physician) and Advanced Practice Providers (APPs -  Physician Assistants and Nurse Practitioners) who all work together to provide you with the care you need, when you need it. ° °We recommend signing up for the patient portal called "MyChart".  Sign up information is provided on this After Visit Summary.  MyChart is used to connect with patients for Virtual Visits (Telemedicine).  Patients are able to view lab/test results, encounter notes, upcoming appointments, etc.  Non-urgent messages can be sent to your provider as well.   °To learn more about what you can do with MyChart, go to [email or web address].   ° °Your next appointment:   °6 month(s) ° °The format for your next appointment:   °In Person ° °Provider:   °Samuel McDowell, MD ° ° °Other Instructions °None ° ° ° ° °Thank you for choosing  [facility] ! ° ° ° ° ° ° ° ° °

## 2020-01-28 NOTE — Progress Notes (Signed)
Cardiology Office Note  Date: 01/28/2020   ID: [name], DOB Sep 15, 1940, MRN [id number]  PCP:  [name], MD  Cardiologist:  [name], MD Electrophysiologist:  [name], MD   Chief Complaint  Patient presents with  . Cardiac follow-up    History of Present Illness: Thomas Dec. is a 79 y.o. male last seen in May.  He is here for a routine visit.  Continues to do well, reports no active angina and NYHA class II dyspnea with typical activities.  He follows with Dr. [name], Medtronic ICD in place.  Showed normal function.  He has had no palpitations or syncope, no device discharges.  Follow-up echocardiogram done recently in November revealed stable LVEF in the range of 30 to 35% with wall motion abnormalities consistent with ischemic cardiomyopathy and mild diastolic dysfunction.  I reviewed his cardiac regimen which is stable and outlined below.  He has lab work scheduled with Dr. [name] tomorrow.  Past Medical History:  Diagnosis Date  . Chronic neck pain   . Coronary artery disease    a. 11/2017: s/p anterolateral STEMI with cath showing severe thrombotic occlusion of proximal-LAD --> PCI/DES performed  . Essential hypertension   . Headache(784.0)   . Hypercholesteremia   . ICD (implantable cardioverter-defibrillator) in place    Medtronic, September 2020 - Dr. [name]  . Ischemic cardiomyopathy   . Migraine   . Muscle spasms of neck   . Myocardial infarction [facility])     Past Surgical History:  Procedure Laterality Date  . CHOLECYSTECTOMY     1/12  . COLONOSCOPY  03/15/2011   Procedure: COLONOSCOPY;  Surgeon: [name], MD;  [facility];  Service: Endoscopy;  Laterality: N/A;  9:30 am  . CORONARY STENT INTERVENTION N/A 12/03/2017   Procedure: CORONARY STENT INTERVENTION;  Surgeon: [name], MD;  Location: [facility] INVASIVE CV LAB;  Service: Cardiovascular;  Laterality: N/A;  . ESOPHAGOGASTRODUODENOSCOPY N/A 06/26/2012    Procedure: ESOPHAGOGASTRODUODENOSCOPY (EGD);  Surgeon: [name], MD;  [facility];  Service: Endoscopy;  Laterality: N/A;  . fractured jaw    . fractured nose    . ICD IMPLANT N/A 11/27/2018   Procedure: ICD IMPLANT;  Surgeon: [name], MD;  Location: [facility] INVASIVE CV LAB;  Service: Cardiovascular;  Laterality: N/A;  . left acl repair    . LEFT HEART CATH AND CORONARY ANGIOGRAPHY N/A 12/03/2017   Procedure: LEFT HEART CATH AND CORONARY ANGIOGRAPHY;  Surgeon: [name], MD;  Location: [facility] INVASIVE CV LAB;  Service: Cardiovascular;  Laterality: N/A;  . Left Knee Arthroscopy    . TIBIA FRACTURE SURGERY      Current Outpatient Medications  Medication Sig Dispense Refill  . acetaminophen (TYLENOL) 325 MG tablet Take 650 mg by mouth every 6 (six) hours as needed for mild pain.    Marland Kitchen aspirin 81 MG EC tablet Take 1 tablet (81 mg total) by mouth daily. 30 tablet 0  . atorvastatin (LIPITOR) 80 MG tablet TAKE ONE TABLET (80MG  TOTAL) BY MOUTH DAILY AT 6PM 90 tablet 3  . carvedilol (COREG) 6.25 MG tablet TAKE ONE TABLET (6.25MG  TOTAL) BY MOUTH TWO TIMES DAILY WITH A MEAL 180 tablet 3  . clopidogrel (PLAVIX) 75 MG tablet TAKE ONE (1) TABLET BY MOUTH EVERY DAY 90 tablet 3  . cyclobenzaprine (FLEXERIL) 10 MG tablet Take 10 mg by mouth daily as needed for muscle spasms.     docusate sodium (COLACE)  100 MG capsule Take 100 mg by mouth at bedtime.    Marland Kitchen ENTRESTO 24-26 MG TAKE ONE TABLET BY MOUTH TWICE A DAY 60 tablet 11  . Melatonin 5 MG CAPS Take 10 mg by mouth at bedtime.     Marland Kitchen omeprazole (PRILOSEC) 20 MG capsule Take 20 mg by mouth every evening.    Marland Kitchen oxyCODONE-acetaminophen (PERCOCET) 10-325 MG per tablet Take 0.5-1 tablets by mouth daily as needed for pain (for migraine pain). migraines    . spironolactone (ALDACTONE) 25 MG tablet TAKE ONE TABLET (25MG  TOTAL) BY MOUTH DAILY 90 tablet 3   No current facility-administered medications for this visit.   Allergies:  Patient has  no allergy information on record.   ROS: No syncope.  Physical Exam: VS:  BP 110/64   Pulse 64   Ht 6\' 1"  (1.854 m)   Wt 211 lb (95.7 kg)   SpO2 100%   BMI 27.84 kg/m , BMI Body mass index is 27.84 kg/m.  Wt Readings from Last 3 Encounters:  01/28/20 211 lb (95.7 kg)  07/15/19 209 lb (94.8 kg)  03/04/19 207 lb (93.9 kg)    General: Elderly male, appears comfortable at rest. HEENT: Conjunctiva and lids normal, wearing a mask. Neck: Supple, no elevated JVP or carotid bruits, no thyromegaly. Lungs: Clear to auscultation, nonlabored breathing at rest. Cardiac: Regular rate and rhythm, no S3 or significant systolic murmur, no pericardial rub. Extremities: No pitting edema.  ECG:  An ECG dated 11/28/2018 was personally reviewed today and demonstrated:  Sinus rhythm with low voltage and old anterior infarct pattern.  Recent Labwork: No results found for requested labs within last 8760 hours.     Component Value Date/Time   CHOL 200 12/04/2017 0437   TRIG 163 (H) 12/04/2017 0437   HDL 60 12/04/2017 0437   CHOLHDL 3.3 12/04/2017 0437   VLDL 33 12/04/2017 0437   LDLCALC 107 (H) 12/04/2017 0437    Other Studies Reviewed Today:  Echocardiogram 12/29/2019: 1. The entire apex is akinetic. 02/03/2018 Left ventricular ejection fraction, by  estimation, is 30 to 35%. The left ventricle has moderately decreased  function. The left ventricle demonstrates regional wall motion  abnormalities (see scoring diagram/findings for  description). There is moderate left ventricular hypertrophy. Left  ventricular diastolic parameters are consistent with Grade I diastolic  dysfunction (impaired relaxation).  2. Right ventricular systolic function is normal. The right ventricular  size is normal.  3. The mitral valve is normal in structure. No evidence of mitral valve  regurgitation. No evidence of mitral stenosis.  4. The aortic valve is tricuspid. Aortic valve regurgitation is mild. No  aortic  stenosis is present.   Assessment and Plan:  1.  Ischemic cardiomyopathy with LVEF 30 to 35%.  He continues to do well, NYHA class II dyspnea, stable weight.  Tolerating medical therapy well.  Continue Coreg, Entresto, and Aldactone.  Follow-up lab work with Dr. 13/02/2019 tomorrow as scheduled.  2.  CAD status post anterolateral STEMI in October 2019 status post DES to the proximal LAD.  He reports no active angina.  Continue aspirin, Plavix, Coreg, and Lipitor.  3.  Mixed hyperlipidemia on Lipitor.  Due for follow-up lab work with Dr. [name].  Medication Adjustments/Labs and Tests Ordered: Current medicines are reviewed at length with the patient today.  Concerns regarding medicines are outlined above.   Tests Ordered: Orders Placed This Encounter  Procedures  . EKG 12-Lead    Medication Changes: No orders of the defined  types were placed in this encounter.   Disposition:  Follow up 6 months in the [place] office.  Signed, Jonelle Sidle, MD, [facility] 01/28/2020 3:10 PM    [facility] at [facility] [address] [place], [place] [address] Phone: [phone]; Fax: [phone]

## 2020-01-29 DIAGNOSIS — I255 Ischemic cardiomyopathy: Secondary | ICD-10-CM | POA: Diagnosis not present

## 2020-01-29 DIAGNOSIS — Z79899 Other long term (current) drug therapy: Secondary | ICD-10-CM | POA: Diagnosis not present

## 2020-01-29 DIAGNOSIS — I10 Essential (primary) hypertension: Secondary | ICD-10-CM | POA: Diagnosis not present

## 2020-01-29 DIAGNOSIS — E785 Hyperlipidemia, unspecified: Secondary | ICD-10-CM | POA: Diagnosis not present

## 2020-01-29 DIAGNOSIS — K219 Gastro-esophageal reflux disease without esophagitis: Secondary | ICD-10-CM | POA: Diagnosis not present

## 2020-01-29 DIAGNOSIS — I2510 Atherosclerotic heart disease of native coronary artery without angina pectoris: Secondary | ICD-10-CM | POA: Diagnosis not present

## 2020-02-03 DIAGNOSIS — L570 Actinic keratosis: Secondary | ICD-10-CM | POA: Diagnosis not present

## 2020-02-03 DIAGNOSIS — L814 Other melanin hyperpigmentation: Secondary | ICD-10-CM | POA: Diagnosis not present

## 2020-02-03 DIAGNOSIS — Z85828 Personal history of other malignant neoplasm of skin: Secondary | ICD-10-CM | POA: Diagnosis not present

## 2020-02-03 DIAGNOSIS — L84 Corns and callosities: Secondary | ICD-10-CM | POA: Diagnosis not present

## 2020-02-03 DIAGNOSIS — D1801 Hemangioma of skin and subcutaneous tissue: Secondary | ICD-10-CM | POA: Diagnosis not present

## 2020-02-03 DIAGNOSIS — L821 Other seborrheic keratosis: Secondary | ICD-10-CM | POA: Diagnosis not present

## 2020-02-03 DIAGNOSIS — L820 Inflamed seborrheic keratosis: Secondary | ICD-10-CM | POA: Diagnosis not present

## 2020-02-03 DIAGNOSIS — D225 Melanocytic nevi of trunk: Secondary | ICD-10-CM | POA: Diagnosis not present

## 2020-02-05 DIAGNOSIS — E785 Hyperlipidemia, unspecified: Secondary | ICD-10-CM | POA: Diagnosis not present

## 2020-02-05 DIAGNOSIS — Z6829 Body mass index (BMI) 29.0-29.9, adult: Secondary | ICD-10-CM | POA: Diagnosis not present

## 2020-02-05 DIAGNOSIS — I2510 Atherosclerotic heart disease of native coronary artery without angina pectoris: Secondary | ICD-10-CM | POA: Diagnosis not present

## 2020-02-05 DIAGNOSIS — K2100 Gastro-esophageal reflux disease with esophagitis, without bleeding: Secondary | ICD-10-CM | POA: Diagnosis not present

## 2020-02-05 DIAGNOSIS — I255 Ischemic cardiomyopathy: Secondary | ICD-10-CM | POA: Diagnosis not present

## 2020-02-05 DIAGNOSIS — Z0001 Encounter for general adult medical examination with abnormal findings: Secondary | ICD-10-CM | POA: Diagnosis not present

## 2020-03-03 ENCOUNTER — Encounter: Payer: Self-pay | Admitting: Internal Medicine

## 2020-03-03 ENCOUNTER — Other Ambulatory Visit: Payer: Self-pay

## 2020-03-03 ENCOUNTER — Ambulatory Visit (INDEPENDENT_AMBULATORY_CARE_PROVIDER_SITE_OTHER): Payer: Medicare PPO | Admitting: Internal Medicine

## 2020-03-03 VITALS — BP 128/72 | HR 58 | Ht 72.0 in | Wt 214.0 lb

## 2020-03-03 DIAGNOSIS — I5022 Chronic systolic (congestive) heart failure: Secondary | ICD-10-CM

## 2020-03-03 DIAGNOSIS — I255 Ischemic cardiomyopathy: Secondary | ICD-10-CM | POA: Diagnosis not present

## 2020-03-03 DIAGNOSIS — Z95810 Presence of automatic (implantable) cardiac defibrillator: Secondary | ICD-10-CM | POA: Diagnosis not present

## 2020-03-03 LAB — CUP PACEART INCLINIC DEVICE CHECK
Battery Remaining Longevity: 114 mo
Battery Voltage: 3.02 V
Brady Statistic AP VP Percent: 0.03 %
Brady Statistic AP VS Percent: 47.38 %
Brady Statistic AS VP Percent: 0.02 %
Brady Statistic AS VS Percent: 52.57 %
Brady Statistic RA Percent Paced: 47.27 %
Brady Statistic RV Percent Paced: 0.05 %
Date Time Interrogation Session: 20220105101432
HighPow Impedance: 67 Ohm
Implantable Lead Implant Date: 20200930
Implantable Lead Implant Date: 20200930
Implantable Lead Location: 753859
Implantable Lead Location: 753860
Implantable Lead Model: 5076
Implantable Lead Model: 6935
Implantable Pulse Generator Implant Date: 20200930
Lead Channel Impedance Value: 304 Ohm
Lead Channel Impedance Value: 361 Ohm
Lead Channel Impedance Value: 475 Ohm
Lead Channel Pacing Threshold Amplitude: 0.75 V
Lead Channel Pacing Threshold Amplitude: 1 V
Lead Channel Pacing Threshold Pulse Width: 0.4 ms
Lead Channel Pacing Threshold Pulse Width: 0.4 ms
Lead Channel Sensing Intrinsic Amplitude: 18 mV
Lead Channel Sensing Intrinsic Amplitude: 3 mV
Lead Channel Setting Pacing Amplitude: 2 V
Lead Channel Setting Pacing Amplitude: 2.5 V
Lead Channel Setting Pacing Pulse Width: 0.4 ms
Lead Channel Setting Sensing Sensitivity: 0.3 mV

## 2020-03-03 NOTE — Patient Instructions (Signed)
Medication Instructions:  Your physician recommends that you continue on your current medications as directed. Please refer to the Current Medication list given to you today.  *If you need a refill on your cardiac medications before your next appointment, please call your pharmacy*   Lab Work: NONE   If you have labs (blood work) drawn today and your tests are completely normal, you will receive your results only by: . MyChart Message (if you have MyChart) OR . A paper copy in the mail If you have any lab test that is abnormal or we need to change your treatment, we will call you to review the results.   Testing/Procedures: NONE    Follow-Up: At [facility], you and your health needs are our priority.  As part of our continuing mission to provide you with exceptional heart care, we have created designated Provider Care Teams.  These Care Teams include your primary Cardiologist (physician) and Advanced Practice Providers (APPs -  Physician Assistants and Nurse Practitioners) who all work together to provide you with the care you need, when you need it.  We recommend signing up for the patient portal called "MyChart".  Sign up information is provided on this After Visit Summary.  MyChart is used to connect with patients for Virtual Visits (Telemedicine).  Patients are able to view lab/test results, encounter notes, upcoming appointments, etc.  Non-urgent messages can be sent to your provider as well.   To learn more about what you can do with MyChart, go to [email or web address].    Your next appointment:   1 year(s)  The format for your next appointment:   In Person  Provider:   Gregg Taylor, MD   Other Instructions Thank you for choosing [facility]!    

## 2020-03-03 NOTE — Progress Notes (Signed)
HPI [name] returns today for ongoing evaluation and management of his ICD in the setting of an ICM. He is s/p MI over a year ago. Since his ICD insertion, he has done well. He denies chest pain, sob, or ICD therapy. He has managed to avoid Covid 19. He remains active maintaining his church.  Not on File   Current Outpatient Medications  Medication Sig Dispense Refill  . acetaminophen (TYLENOL) 325 MG tablet Take 650 mg by mouth every 6 (six) hours as needed for mild pain.    Marland Kitchen aspirin 81 MG EC tablet Take 1 tablet (81 mg total) by mouth daily. 30 tablet 0  . atorvastatin (LIPITOR) 80 MG tablet TAKE ONE TABLET (80MG  TOTAL) BY MOUTH DAILY AT 6PM 90 tablet 3  . carvedilol (COREG) 6.25 MG tablet TAKE ONE TABLET (6.25MG  TOTAL) BY MOUTH TWO TIMES DAILY WITH A MEAL 180 tablet 3  . clopidogrel (PLAVIX) 75 MG tablet TAKE ONE (1) TABLET BY MOUTH EVERY DAY 90 tablet 3  . cyclobenzaprine (FLEXERIL) 10 MG tablet Take 10 mg by mouth daily as needed for muscle spasms.     docusate sodium (COLACE) 100 MG capsule Take 100 mg by mouth at bedtime.    Marland Kitchen ENTRESTO 24-26 MG TAKE ONE TABLET BY MOUTH TWICE A DAY 60 tablet 11  . Melatonin 5 MG CAPS Take 10 mg by mouth at bedtime.     Marland Kitchen omeprazole (PRILOSEC) 20 MG capsule Take 20 mg by mouth every evening.    Marland Kitchen oxyCODONE-acetaminophen (PERCOCET) 10-325 MG per tablet Take 0.5-1 tablets by mouth daily as needed for pain (for migraine pain). migraines    . spironolactone (ALDACTONE) 25 MG tablet TAKE ONE TABLET (25MG  TOTAL) BY MOUTH DAILY 90 tablet 3   No current facility-administered medications for this visit.     Past Medical History:  Diagnosis Date  . Chronic neck pain   . Coronary artery disease    a. 11/2017: s/p anterolateral STEMI with cath showing severe thrombotic occlusion of proximal-LAD --> PCI/DES performed  . Essential hypertension   . Headache(784.0)   . Hypercholesteremia   . ICD (implantable cardioverter-defibrillator) in place     Medtronic, September 2020 - Dr. 12/2017  . Ischemic cardiomyopathy   . Migraine   . Muscle spasms of neck   . Myocardial infarction (HCC)     ROS:   All systems reviewed and negative except as noted in the HPI.   Past Surgical History:  Procedure Laterality Date  . CHOLECYSTECTOMY     1/12  . COLONOSCOPY  03/15/2011   Procedure: COLONOSCOPY;  Surgeon: 3/12, MD;  [facility];  Service: Endoscopy;  Laterality: N/A;  9:30 am  . CORONARY STENT INTERVENTION N/A 12/03/2017   Procedure: CORONARY STENT INTERVENTION;  Surgeon: [name], MD;  Location: [facility] INVASIVE CV LAB;  Service: Cardiovascular;  Laterality: N/A;  . ESOPHAGOGASTRODUODENOSCOPY N/A 06/26/2012   Procedure: ESOPHAGOGASTRODUODENOSCOPY (EGD);  Surgeon: [facility], MD;  Location: [facility];  Service: Endoscopy;  Laterality: N/A;  . fractured jaw    . fractured nose    . ICD IMPLANT N/A 11/27/2018   Procedure: ICD IMPLANT;  Surgeon: [name], MD;  Location: [facility] INVASIVE CV LAB;  Service: Cardiovascular;  Laterality: N/A;  . left acl repair    . LEFT HEART CATH AND CORONARY ANGIOGRAPHY N/A 12/03/2017   Procedure: LEFT HEART CATH AND CORONARY ANGIOGRAPHY;  Surgeon: [facility], MD;  Location:  [facility] INVASIVE CV LAB;  Service: Cardiovascular;  Laterality: N/A;  . Left Knee Arthroscopy    . TIBIA FRACTURE SURGERY       Family History  Problem Relation Age of Onset  . Cancer Mother   . Colon cancer Neg Hx   . Heart disease Neg Hx      Social History   Socioeconomic History  . Marital status: Married    Spouse name: Joy  . Number of children: Not on file  . Years of education: Not on file  . Highest education level: Not on file  Occupational History  . Occupation: Former TEFL teacher   Tobacco Use  . Smoking status: Never Smoker  . Smokeless tobacco: Never Used  Vaping Use  . Vaping Use: Never used  Substance and Sexual Activity  . Alcohol use: No  . Drug use: No  .  Sexual activity: Not on file  Other Topics Concern  . Not on file  Social History Narrative  . Not on file   Social Determinants of Health   Financial Resource Strain: Not on file  Food Insecurity: Not on file  Transportation Needs: Not on file  Physical Activity: Not on file  Stress: Not on file  Social Connections: Not on file  Intimate Partner Violence: Not on file     Ht 6' (1.829 m)   Wt 214 lb (97.1 kg)   BMI 29.02 kg/m   Physical Exam:  Well appearing NAD HEENT: Unremarkable Neck:  No JVD, no thyromegally Lymphatics:  No adenopathy Back:  No CVA tenderness Lungs:  Clear with no wheezes HEART:  Regular rate rhythm, no murmurs, no rubs, no clicks Abd:  soft, positive bowel sounds, no organomegally, no rebound, no guarding Ext:  2 plus pulses, no edema, no cyanosis, no clubbing Skin:  No rashes no nodules Neuro:  CN II through XII intact, motor grossly intact  DEVICE  Normal device function.  See PaceArt for details.   Assess/Plan:.  1. Chronic systolic heart failure -His symptoms remain class 2. He will continue his current meds. 2. CAD - he denies anginal symptoms. He is encouraged to increase his activity. 3. ICD - his medtronic dual chamber ICD is working normally. We will recheck in several months.   Sharlot Gowda Alaine Loughney,MD

## 2020-03-05 ENCOUNTER — Ambulatory Visit (INDEPENDENT_AMBULATORY_CARE_PROVIDER_SITE_OTHER): Payer: Medicare PPO

## 2020-03-05 DIAGNOSIS — I255 Ischemic cardiomyopathy: Secondary | ICD-10-CM

## 2020-03-05 LAB — CUP PACEART REMOTE DEVICE CHECK
Battery Remaining Longevity: 114 mo
Battery Voltage: 3.02 V
Brady Statistic AP VP Percent: 0.02 %
Brady Statistic AP VS Percent: 22.74 %
Brady Statistic AS VP Percent: 0.04 %
Brady Statistic AS VS Percent: 77.19 %
Brady Statistic RA Percent Paced: 22.71 %
Brady Statistic RV Percent Paced: 0.07 %
Date Time Interrogation Session: 20220107061703
HighPow Impedance: 68 Ohm
Implantable Lead Implant Date: 20200930
Implantable Lead Implant Date: 20200930
Implantable Lead Location: 753859
Implantable Lead Location: 753860
Implantable Lead Model: 5076
Implantable Lead Model: 6935
Implantable Pulse Generator Implant Date: 20200930
Lead Channel Impedance Value: 342 Ohm
Lead Channel Impedance Value: 361 Ohm
Lead Channel Impedance Value: 475 Ohm
Lead Channel Pacing Threshold Amplitude: 0.625 V
Lead Channel Pacing Threshold Amplitude: 1 V
Lead Channel Pacing Threshold Pulse Width: 0.4 ms
Lead Channel Pacing Threshold Pulse Width: 0.4 ms
Lead Channel Sensing Intrinsic Amplitude: 18.125 mV
Lead Channel Sensing Intrinsic Amplitude: 18.125 mV
Lead Channel Sensing Intrinsic Amplitude: 2.5 mV
Lead Channel Sensing Intrinsic Amplitude: 2.5 mV
Lead Channel Setting Pacing Amplitude: 2 V
Lead Channel Setting Pacing Amplitude: 2.5 V
Lead Channel Setting Pacing Pulse Width: 0.4 ms
Lead Channel Setting Sensing Sensitivity: 0.3 mV

## 2020-03-09 IMAGING — CR DG CHEST 2V
2 series · 2 of 2 positions shown · non-contrast
Comparison: 01/25/2017

CLINICAL DATA: Postop from ICD placement.

EXAM:
CHEST - 2 VIEW

[w chest pa]
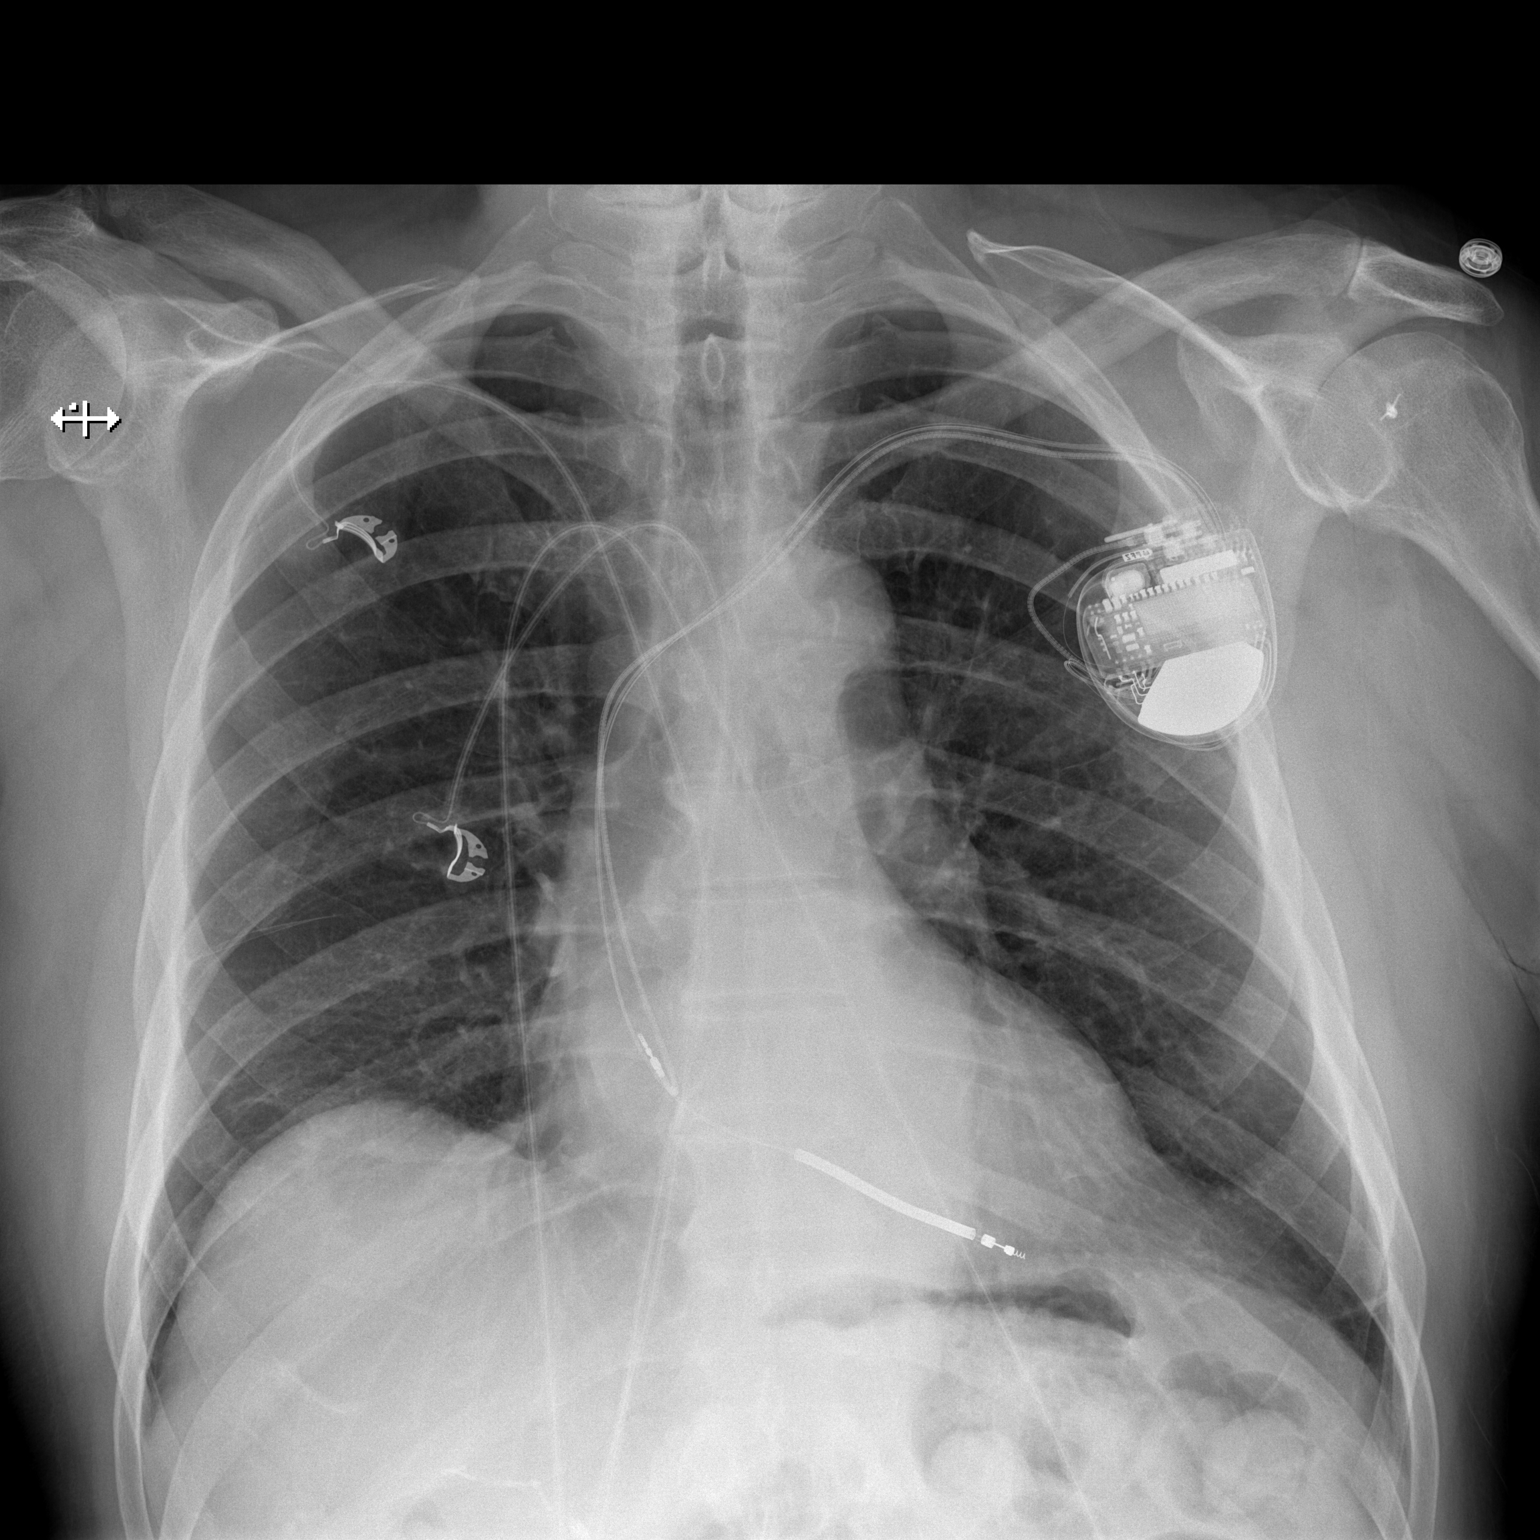

[w chest lat]
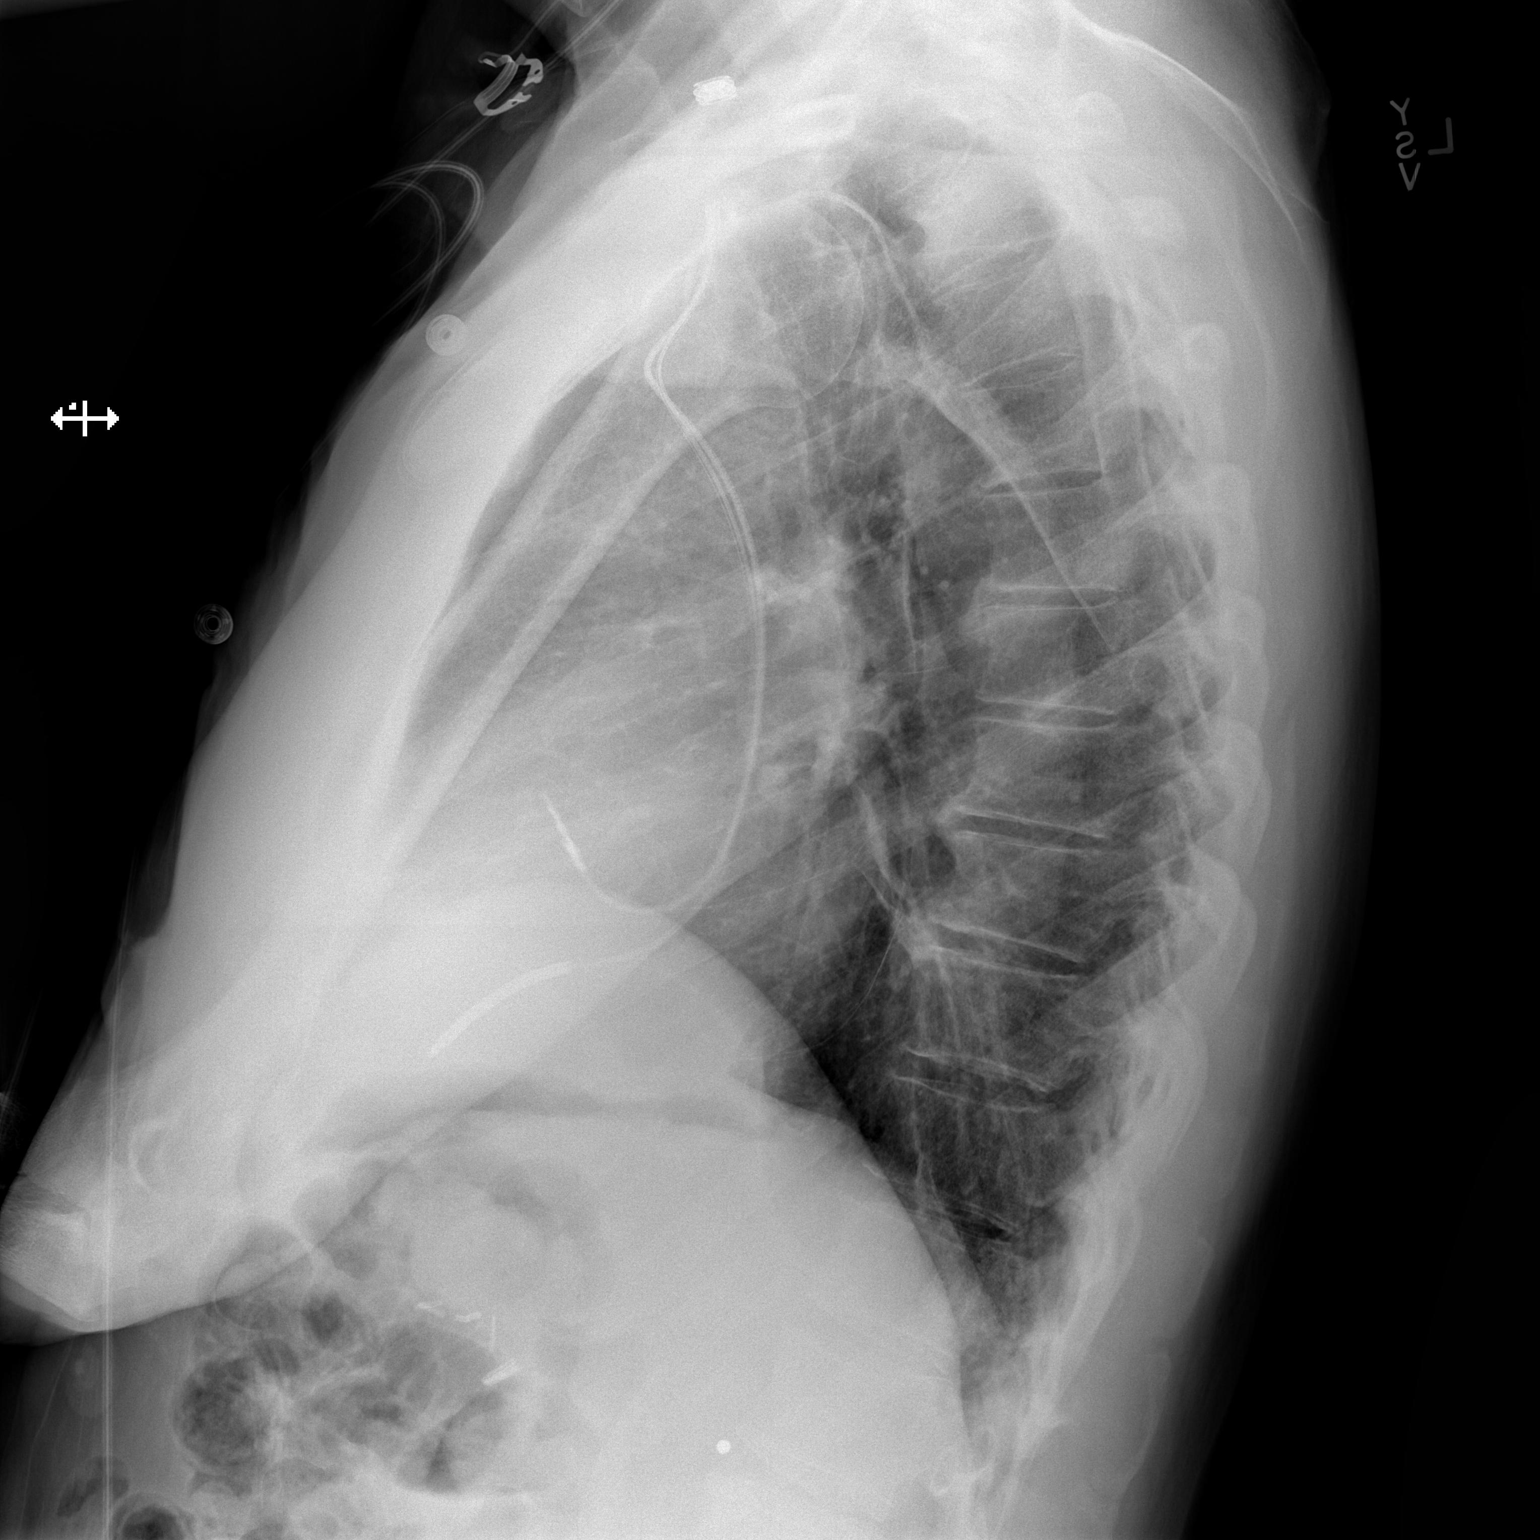

[2 of 2 positions shown; findings below may reference images not displayed]

FINDINGS: Heart size is normal. New dual lead ICD is seen in appropriate
position no evidence of pneumothorax or pleural effusion. Both lungs
are clear.
IMPRESSION: New dual lead ICD in appropriate position. No active cardiopulmonary
disease.

## 2020-03-17 ENCOUNTER — Other Ambulatory Visit: Payer: Self-pay | Admitting: Cardiology

## 2020-03-19 NOTE — Progress Notes (Signed)
Remote ICD transmission.   

## 2020-03-30 DIAGNOSIS — M542 Cervicalgia: Secondary | ICD-10-CM | POA: Diagnosis not present

## 2020-03-30 DIAGNOSIS — G43009 Migraine without aura, not intractable, without status migrainosus: Secondary | ICD-10-CM | POA: Diagnosis not present

## 2020-06-04 ENCOUNTER — Ambulatory Visit (INDEPENDENT_AMBULATORY_CARE_PROVIDER_SITE_OTHER): Payer: Medicare PPO

## 2020-06-04 DIAGNOSIS — I255 Ischemic cardiomyopathy: Secondary | ICD-10-CM

## 2020-06-04 LAB — CUP PACEART REMOTE DEVICE CHECK
Battery Remaining Longevity: 111 mo
Battery Voltage: 3.02 V
Brady Statistic AP VP Percent: 0.03 %
Brady Statistic AP VS Percent: 43.73 %
Brady Statistic AS VP Percent: 0.02 %
Brady Statistic AS VS Percent: 56.22 %
Brady Statistic RA Percent Paced: 43.59 %
Brady Statistic RV Percent Paced: 0.05 %
Date Time Interrogation Session: 20220408082706
HighPow Impedance: 70 Ohm
Implantable Lead Implant Date: 20200930
Implantable Lead Implant Date: 20200930
Implantable Lead Location: 753859
Implantable Lead Location: 753860
Implantable Lead Model: 5076
Implantable Lead Model: 6935
Implantable Pulse Generator Implant Date: 20200930
Lead Channel Impedance Value: 304 Ohm
Lead Channel Impedance Value: 342 Ohm
Lead Channel Impedance Value: 456 Ohm
Lead Channel Pacing Threshold Amplitude: 0.625 V
Lead Channel Pacing Threshold Amplitude: 0.875 V
Lead Channel Pacing Threshold Pulse Width: 0.4 ms
Lead Channel Pacing Threshold Pulse Width: 0.4 ms
Lead Channel Sensing Intrinsic Amplitude: 16.625 mV
Lead Channel Sensing Intrinsic Amplitude: 16.625 mV
Lead Channel Sensing Intrinsic Amplitude: 2.25 mV
Lead Channel Sensing Intrinsic Amplitude: 2.25 mV
Lead Channel Setting Pacing Amplitude: 2 V
Lead Channel Setting Pacing Amplitude: 2.5 V
Lead Channel Setting Pacing Pulse Width: 0.4 ms
Lead Channel Setting Sensing Sensitivity: 0.3 mV

## 2020-06-17 NOTE — Progress Notes (Signed)
Remote ICD transmission.   

## 2020-09-21 DIAGNOSIS — I2510 Atherosclerotic heart disease of native coronary artery without angina pectoris: Secondary | ICD-10-CM | POA: Diagnosis not present

## 2020-09-21 DIAGNOSIS — I5022 Chronic systolic (congestive) heart failure: Secondary | ICD-10-CM | POA: Diagnosis not present

## 2020-09-28 ENCOUNTER — Other Ambulatory Visit: Payer: Self-pay | Admitting: Cardiology

## 2020-09-29 NOTE — Progress Notes (Signed)
Cardiology Office Note    Date:  10/04/2020   ID:  Thomas Black., DOB August 01, 1940, MRN [id number]   PCP:  Asencion Noble, MD   [facility]  Cardiologist:  Rozann Lesches, MD   Advanced Practice Provider:  No care team member to display Electrophysiologist:  [name], MD   [phone]   Chief Complaint  Patient presents with   Follow-up     History of Present Illness:  Thomas Black. is a 80 y.o. male with history of CAD status post anterior lateral STEMI 11/2017 treated with DES to the proximal LAD, ischemic cardiomyopathy ejection fraction 30 to 35% on echo 11/2019 on Coreg Entresto and Aldactone, ICD followed by Dr. Lovena Le, HLD on statin.  Patient last saw Dr. Domenic Polite 01/2020 was doing well.  ICD checked by Dr. Lovena Le 02/2020 stable.  Patient comes in for f/u. Last week had dizziness when standing up-has been going on for a couple months.. Not having trouble this week. No syncope. Denies chest pain, dyspnea, palpitations. Walking 3 miles daily and has lost 9 lbs since Jan. BP low at PCP last week also.    Past Medical History:  Diagnosis Date   Chronic neck pain    Coronary artery disease    a. 11/2017: s/p anterolateral STEMI with cath showing severe thrombotic occlusion of proximal-LAD --> PCI/DES performed   Essential hypertension    Headache(784.0)    Hypercholesteremia    ICD (implantable cardioverter-defibrillator) in place    [employer], September 2020 - Dr. Lovena Le   Ischemic cardiomyopathy    Migraine    Muscle spasms of neck    Myocardial infarction [facility])     Past Surgical History:  Procedure Laterality Date   CHOLECYSTECTOMY     1/12   COLONOSCOPY  03/15/2011   Procedure: COLONOSCOPY;  Surgeon: Rogene Houston, MD;  [facility];  Service: Endoscopy;  Laterality: N/A;  9:30 am   CORONARY STENT INTERVENTION N/A 12/03/2017   Procedure: CORONARY STENT INTERVENTION;  Surgeon: Sherren Mocha, MD;  Location: [place]  CV LAB;  Service: Cardiovascular;  Laterality: N/A;   ESOPHAGOGASTRODUODENOSCOPY N/A 06/26/2012   Procedure: ESOPHAGOGASTRODUODENOSCOPY (EGD);  Surgeon: Rogene [place], MD;  [facility];  Service: Endoscopy;  Laterality: N/A;   fractured jaw     fractured nose     ICD IMPLANT N/A 11/27/2018   Procedure: ICD IMPLANT;  Surgeon: Evans Lance, MD;  Location: [place] CV LAB;  Service: Cardiovascular;  Laterality: N/A;   left acl repair     LEFT HEART CATH AND CORONARY ANGIOGRAPHY N/A 12/03/2017   Procedure: LEFT HEART CATH AND CORONARY ANGIOGRAPHY;  Surgeon: Sherren Mocha, MD;  Location: [place] CV LAB;  Service: Cardiovascular;  Laterality: N/A;   Left Knee Arthroscopy     TIBIA FRACTURE SURGERY      Current Medications: Current Meds  Medication Sig   acetaminophen (TYLENOL) 325 MG tablet Take 650 mg by mouth every 6 (six) hours as needed for mild pain.   aspirin 81 MG EC tablet Take 1 tablet (81 mg total) by mouth daily.   atorvastatin (LIPITOR) 80 MG tablet TAKE ONE TABLET (80MG TOTAL) BY MOUTH DAILY AT 6PM   carvedilol (COREG) 6.25 MG tablet TAKE ONE TABLET (6.25MG TOTAL) BY MOUTH TWO TIMES DAILY WITH A MEAL   clopidogrel (PLAVIX) 75 MG tablet TAKE ONE (1) TABLET BY MOUTH EVERY DAY   ENTRESTO 24-26 MG TAKE ONE TABLET BY  MOUTH TWICE A DAY   omeprazole (PRILOSEC) 20 MG capsule Take 20 mg by mouth every evening.   oxyCODONE-acetaminophen (PERCOCET) 10-325 MG per tablet Take 0.5-1 tablets by mouth daily as needed for pain (for migraine pain). migraines   spironolactone (ALDACTONE) 25 MG tablet Take 0.5 tablets (12.5 mg total) by mouth daily.   [DISCONTINUED] spironolactone (ALDACTONE) 25 MG tablet TAKE ONE TABLET (25MG TOTAL) BY MOUTH DAILY     Allergies:   Patient has no allergy information on record.   Social History   Socioeconomic History   Marital status: Married    Spouse name: Thomas Black   Number of children: Not on file   Years of education: Not on file    Highest education level: Not on file  Occupational History   Occupation: Former Academic librarian   Tobacco Use   Smoking status: Never   Smokeless tobacco: Never  Vaping Use   Vaping Use: Never used  Substance and Sexual Activity   Alcohol use: No   Drug use: No   Sexual activity: Not on file  Other Topics Concern   Not on file  Social History Narrative   Not on file   Social Determinants of Health   Financial Resource Strain: Not on file  Food Insecurity: Not on file  Transportation Needs: Not on file  Physical Activity: Not on file  Stress: Not on file  Social Connections: Not on file     Family History:  The patient's  family history includes Cancer in his mother.   ROS:   Please see the history of present illness.    ROS All other systems reviewed and are negative.   PHYSICAL EXAM:   VS:  Ht _0  (1.854 m)   Wt 205 lb (93 kg)   SpO2 95%   BMI 27.05 kg/m   Physical Exam  GEN: Well nourished, well developed, in no acute distress  Neck: no JVD, carotid bruits, or masses Cardiac:RRR; no murmurs, rubs, or gallops  Respiratory:  clear to auscultation bilaterally, normal work of breathing GI: soft, nontender, nondistended, + BS Ext: without cyanosis, clubbing, or edema, Good distal pulses bilaterally Neuro:  Alert and Oriented x 3 Psych: euthymic mood, full affect  Wt Readings from Last 3 Encounters:  10/04/20 205 lb (93 kg)  03/03/20 214 lb (97.1 kg)  01/28/20 211 lb (95.7 kg)      Studies/Labs Reviewed:   EKG:  EKG is not ordered today.    Recent Labs: No results found for requested labs within last 8760 hours.   Lipid Panel    Component Value Date/Time   CHOL 200 12/04/2017 0437   TRIG 163 (H) 12/04/2017 0437   HDL 60 12/04/2017 0437   CHOLHDL 3.3 12/04/2017 0437   VLDL 33 12/04/2017 0437   LDLCALC 107 (H) 12/04/2017 0437    Additional studies/ records that were reviewed today include:  Echocardiogram 12/29/2019:  1. The entire apex is  akinetic. Marland Kitchen Left ventricular ejection fraction, by  estimation, is 30 to 35%. The left ventricle has moderately decreased  function. The left ventricle demonstrates regional wall motion  abnormalities (see scoring diagram/findings for   description). There is moderate left ventricular hypertrophy. Left  ventricular diastolic parameters are consistent with Grade I diastolic  dysfunction (impaired relaxation).   2. Right ventricular systolic function is normal. The right ventricular  size is normal.   3. The mitral valve is normal in structure. No evidence of mitral valve  regurgitation. No evidence of  mitral stenosis.   4. The aortic valve is tricuspid. Aortic valve regurgitation is mild. No  aortic stenosis is present.     Risk Assessment/Calculations:         ASSESSMENT:    1. Coronary artery disease involving native coronary artery of native heart without angina pectoris   2. Ischemic cardiomyopathy   3. ICD (implantable cardioverter-defibrillator) in place   4. Other hyperlipidemia   5. Orthostatic hypotension      PLAN:  In order of problems listed above:  Orthostatic hypotension with dizziness with standing for couple months.  He is orthostatic in the office here today.  He has lost about 9 pounds and blood pressure has been running low.  He denies any bleeding problems.  We will check be met and CBC just to make sure.  Decrease spironolactone to half a tablet daily.  Follow-up in 6 weeks.  CAD S/P antlat STEMI treated DES pLAD 11/2017 on ASA, Plavix, Coreg, Lipitor-denies bleeding problems  ICM EF 30-35% 12/2019 no heart failure on exam.  ICD followed by Dr. Dan Maker for device check  HLD LDL 60 01/2020  Shared Decision Making/Informed Consent        Medication Adjustments/Labs and Tests Ordered: Current medicines are reviewed at length with the patient today.  Concerns regarding medicines are outlined above.  Medication changes, Labs and Tests ordered today  are listed in the Patient Instructions below. Patient Instructions  Medication Instructions:   DECREASE Spironolactone to 12.5 mg  daily  *If you need a refill on your cardiac medications before your next appointment, please call your pharmacy*   Lab Work:  CBC, BMET Today  If you have labs (blood work) drawn today and your tests are completely normal, you will receive your results only by: [place] (if you have MyChart) OR A paper copy in the mail If you have any lab test that is abnormal or we need to change your treatment, we will call you to review the results.   Testing/Procedures: None today    Follow-Up: At [facility], you and your health needs are our priority.  As part of our continuing mission to provide you with exceptional heart care, we have created designated Provider Care Teams.  These Care Teams include your primary Cardiologist (physician) and Advanced Practice Providers (APPs -  Physician Assistants and Nurse Practitioners) who all work together to provide you with the care you need, when you need it.  We recommend signing up for the patient portal called "MyChart".  Sign up information is provided on this After Visit Summary.  MyChart is used to connect with patients for Virtual Visits (Telemedicine).  Patients are able to view lab/test results, encounter notes, upcoming appointments, etc.  Non-urgent messages can be sent to your provider as well.   To learn more about what you can do with MyChart, go to [email or web address].    Your next appointment:   6 week(s)  The format for your next appointment:   In Person  Provider:   Ermalinda Barrios, Hershal Coria   Other Instructions Call [place] [phone]  to have pacer interrogated vis phone.     Sumner Boast, PA-C  10/04/2020 12:01 PM    [facility] [place], [place], [place]  [address] Phone: [phone]; Fax: [phone]

## 2020-10-04 ENCOUNTER — Ambulatory Visit: Payer: Medicare PPO | Admitting: Physician Assistant

## 2020-10-04 ENCOUNTER — Other Ambulatory Visit (HOSPITAL_COMMUNITY)
Admission: RE | Admit: 2020-10-04 | Discharge: 2020-10-04 | Disposition: A | Discharge status: Home / Self Care | Payer: Medicare PPO | Source: Ambulatory Visit | Attending: Physician Assistant | Admitting: Physician Assistant

## 2020-10-04 ENCOUNTER — Encounter: Payer: Self-pay | Admitting: Physician Assistant

## 2020-10-04 ENCOUNTER — Other Ambulatory Visit: Payer: Self-pay

## 2020-10-04 VITALS — Ht 73.0 in | Wt 205.0 lb

## 2020-10-04 DIAGNOSIS — I255 Ischemic cardiomyopathy: Secondary | ICD-10-CM

## 2020-10-04 DIAGNOSIS — I951 Orthostatic hypotension: Secondary | ICD-10-CM | POA: Diagnosis not present

## 2020-10-04 DIAGNOSIS — E7849 Other hyperlipidemia: Secondary | ICD-10-CM

## 2020-10-04 DIAGNOSIS — I2510 Atherosclerotic heart disease of native coronary artery without angina pectoris: Secondary | ICD-10-CM | POA: Diagnosis not present

## 2020-10-04 DIAGNOSIS — Z95810 Presence of automatic (implantable) cardiac defibrillator: Secondary | ICD-10-CM | POA: Diagnosis not present

## 2020-10-04 LAB — BASIC METABOLIC PANEL
Anion gap: 5 (ref 5–15)
BUN: 18 mg/dL (ref 8–23)
CO2: 26 mmol/L (ref 22–32)
Calcium: 9.2 mg/dL (ref 8.9–10.3)
Chloride: 107 mmol/L (ref 98–111)
Creatinine, Ser: 0.84 mg/dL (ref 0.61–1.24)
GFR, Estimated: >60 mL/min (ref >60)
Glucose, Bld: 110 mg/dL — ABNORMAL HIGH (ref 70–99)
Potassium: 4.4 mmol/L (ref 3.5–5.1)
Sodium: 138 mmol/L (ref 135–145)

## 2020-10-04 LAB — CBC
HCT: 39.2 % (ref 39.0–52.0)
Hemoglobin: 12.5 g/dL — ABNORMAL LOW (ref 13.0–17.0)
MCH: 30.6 pg (ref 26.0–34.0)
MCHC: 31.9 g/dL (ref 30.0–36.0)
MCV: 96.1 fL (ref 80.0–100.0)
Platelets: 237 10*3/uL (ref 150–400)
RBC: 4.08 MIL/uL — ABNORMAL LOW (ref 4.22–5.81)
RDW: 14.1 % (ref 11.5–15.5)
WBC: 5.4 10*3/uL (ref 4.0–10.5)
nRBC: 0 % (ref 0.0–0.2)

## 2020-10-04 MED ORDER — SPIRONOLACTONE 25 MG PO TABS: 12.5000 mg | ORAL_TABLET | Freq: Every day | ORAL | 3 refills | Status: DC

## 2020-10-04 NOTE — Patient Instructions (Signed)
Medication Instructions:   DECREASE Spironolactone to 12.5 mg  daily  *If you need a refill on your cardiac medications before your next appointment, please call your pharmacy*   Lab Work:  CBC, BMET Today  If you have labs (blood work) drawn today and your tests are completely normal, you will receive your results only by: MyChart Message (if you have MyChart) OR A paper copy in the mail If you have any lab test that is abnormal or we need to change your treatment, we will call you to review the results.   Testing/Procedures: None today    Follow-Up: At [facility], you and your health needs are our priority.  As part of our continuing mission to provide you with exceptional heart care, we have created designated Provider Care Teams.  These Care Teams include your primary Cardiologist (physician) and Advanced Practice Providers (APPs -  Physician Assistants and Nurse Practitioners) who all work together to provide you with the care you need, when you need it.  We recommend signing up for the patient portal called "MyChart".  Sign up information is provided on this After Visit Summary.  MyChart is used to connect with patients for Virtual Visits (Telemedicine).  Patients are able to view lab/test results, encounter notes, upcoming appointments, etc.  Non-urgent messages can be sent to your provider as well.   To learn more about what you can do with MyChart, go to [email or web address].    Your next appointment:   6 week(s)  The format for your next appointment:   In Person  Provider:   Jacolyn Reedy, Cordelia Poche   Other Instructions Call [facility] [phone]  to have pacer interrogated vis phone.

## 2020-10-27 DIAGNOSIS — H43812 Vitreous degeneration, left eye: Secondary | ICD-10-CM | POA: Diagnosis not present

## 2020-10-27 DIAGNOSIS — Q141 Congenital malformation of retina: Secondary | ICD-10-CM | POA: Diagnosis not present

## 2020-10-27 DIAGNOSIS — Z961 Presence of intraocular lens: Secondary | ICD-10-CM | POA: Diagnosis not present

## 2020-10-27 DIAGNOSIS — H10413 Chronic giant papillary conjunctivitis, bilateral: Secondary | ICD-10-CM | POA: Diagnosis not present

## 2020-10-27 DIAGNOSIS — D3132 Benign neoplasm of left choroid: Secondary | ICD-10-CM | POA: Diagnosis not present

## 2020-11-03 NOTE — Progress Notes (Signed)
Cardiology Office Note    Date:  11/17/2020   ID:  Thomas Mink., DOB 10/22/1940, MRN [id number]   PCP:  [name], MD   [facility]  Cardiologist:  [name], MD   Advanced Practice Provider:  No care team member to display Electrophysiologist:  [name], MD   [phone]   Chief Complaint  Patient presents with   Follow-up     History of Present Illness:  Thomas Black. is a 80 y.o. male with history of CAD status post anterior lateral STEMI 11/2017 treated with DES to the proximal LAD, ischemic cardiomyopathy ejection fraction 30 to 35% on echo 12/2019 on Coreg Entresto and Aldactone, ICD followed by Dr. [name], HLD on statin.   Patient last saw Dr. Diona Browner 01/2020 was doing well.  ICD checked by Dr. [name] 02/2020 stable.  I saw the patient 10/04/20 with dizziness with standing and he was found to be orthostatic.  He had lost 9 pounds as well.  I decreased his spironolactone to half a tablet daily.  Labs checked and were all stable.  Patient comes in for f/u. No longer dizzy. He's lost 3 more lbs. Trying to get to 195 lbs. Some DOE but overall doing well. 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 Says he stayed hydrated.      Past Medical History:  Diagnosis Date   Chronic neck pain    Coronary artery disease    a. 11/2017: s/p anterolateral STEMI with cath showing severe thrombotic occlusion of proximal-LAD --> PCI/DES performed   Essential hypertension    Headache(784.0)    Hypercholesteremia    ICD (implantable cardioverter-defibrillator) in place    [employer], September 2020 - Dr. [name]   Ischemic cardiomyopathy    Migraine    Muscle spasms of neck    Myocardial infarction [facility])     Past Surgical History:  Procedure Laterality Date   CHOLECYSTECTOMY     1/12   COLONOSCOPY  03/15/2011   Procedure: COLONOSCOPY;  Surgeon: [name], MD;  [facility];  Service: Endoscopy;   Laterality: N/A;  9:30 am   CORONARY STENT INTERVENTION N/A 12/03/2017   Procedure: CORONARY STENT INTERVENTION;  Surgeon: [name], MD;  Location: [facility] INVASIVE CV LAB;  Service: Cardiovascular;  Laterality: N/A;   ESOPHAGOGASTRODUODENOSCOPY N/A 06/26/2012   Procedure: ESOPHAGOGASTRODUODENOSCOPY (EGD);  Surgeon: [name], MD;  [facility];  Service: Endoscopy;  Laterality: N/A;   fractured jaw     fractured nose     ICD IMPLANT N/A 11/27/2018   Procedure: ICD IMPLANT;  Surgeon: [name], MD;  Location: [facility] INVASIVE CV LAB;  Service: Cardiovascular;  Laterality: N/A;   left acl repair     LEFT HEART CATH AND CORONARY ANGIOGRAPHY N/A 12/03/2017   Procedure: LEFT HEART CATH AND CORONARY ANGIOGRAPHY;  Surgeon: [name], MD;  Location: [facility] INVASIVE CV LAB;  Service: Cardiovascular;  Laterality: N/A;   Left Knee Arthroscopy     TIBIA FRACTURE SURGERY      Current Medications: Current Meds  Medication Sig   acetaminophen (TYLENOL) 325 MG tablet Take 650 mg by mouth every 6 (six) hours as needed for mild pain.   aspirin 81 MG EC tablet Take 1 tablet (81 mg total) by mouth daily.   atorvastatin (LIPITOR) 80 MG tablet TAKE ONE TABLET (80MG  TOTAL) BY MOUTH DAILY AT 6PM   carvedilol (COREG) 6.25 MG  tablet TAKE ONE TABLET (6.25MG  TOTAL) BY MOUTH TWO TIMES DAILY WITH A MEAL   clopidogrel (PLAVIX) 75 MG tablet TAKE ONE (1) TABLET BY MOUTH EVERY DAY   cyclobenzaprine (FLEXERIL) 10 MG tablet Take 10 mg by mouth daily as needed for muscle spasms.   ENTRESTO 24-26 MG TAKE ONE TABLET BY MOUTH TWICE A DAY   Melatonin 5 MG CAPS Take 10 mg by mouth at bedtime.   spironolactone (ALDACTONE) 25 MG tablet Take 0.5 tablets (12.5 mg total) by mouth daily.     Allergies:   Patient has no known allergies.   Social History   Socioeconomic History   Marital status: Married    Spouse name: Thomas Black   Number of children: Not on file   Years of education: Not on file   Highest  education level: Not on file  Occupational History   Occupation: Former TEFL teacher   Tobacco Use   Smoking status: Never   Smokeless tobacco: Never  Vaping Use   Vaping Use: Never used  Substance and Sexual Activity   Alcohol use: No   Drug use: No   Sexual activity: Not on file  Other Topics Concern   Not on file  Social History Narrative   Not on file   Social Determinants of Health   Financial Resource Strain: Not on file  Food Insecurity: Not on file  Transportation Needs: Not on file  Physical Activity: Not on file  Stress: Not on file  Social Connections: Not on file     Family History:  The patient's  family history includes Cancer in his mother.   ROS:   Please see the history of present illness.    ROS All other systems reviewed and are negative.   PHYSICAL EXAM:   VS:  BP 120/68   Pulse 64   Ht 6\' 1"  (1.854 m)   Wt 202 lb 9.6 oz (91.9 kg)   BMI 26.73 kg/m   Physical Exam  GEN: Well nourished, well developed, in no acute distress  Neck: no JVD, carotid bruits, or masses Cardiac:RRR; no murmurs, rubs, or gallops  Respiratory:  clear to auscultation bilaterally, normal work of breathing GI: soft, nontender, nondistended, + BS Ext: without cyanosis, clubbing, or edema, Good distal pulses bilaterally Neuro:  Alert and Oriented x 3,  Psych: euthymic mood, full affect  Wt Readings from Last 3 Encounters:  11/17/20 202 lb 9.6 oz (91.9 kg)  10/04/20 205 lb (93 kg)  03/03/20 214 lb (97.1 kg)      Studies/Labs Reviewed:   EKG:  EKG is not ordered today.     Recent Labs: 10/04/2020: BUN 18; Creatinine, Ser 0.84; Hemoglobin 12.5; Platelets 237; Potassium 4.4; Sodium 138   Lipid Panel    Component Value Date/Time   CHOL 200 12/04/2017 0437   TRIG 163 (H) 12/04/2017 0437   HDL 60 12/04/2017 0437   CHOLHDL 3.3 12/04/2017 0437   VLDL 33 12/04/2017 0437   LDLCALC 107 (H) 12/04/2017 0437    Additional studies/ records that were reviewed today  include:  Echocardiogram 12/29/2019:  1. The entire apex is akinetic. 13/02/2019 Left ventricular ejection fraction, by  estimation, is 30 to 35%. The left ventricle has moderately decreased  function. The left ventricle demonstrates regional wall motion  abnormalities (see scoring diagram/findings for   description). There is moderate left ventricular hypertrophy. Left  ventricular diastolic parameters are consistent with Grade I diastolic  dysfunction (impaired relaxation).   2. Right ventricular systolic function  is normal. The right ventricular  size is normal.   3. The mitral valve is normal in structure. No evidence of mitral valve  regurgitation. No evidence of mitral stenosis.   4. The aortic valve is tricuspid. Aortic valve regurgitation is mild. No  aortic stenosis is present.         Risk Assessment/Calculations:         ASSESSMENT:    1. Orthostatic hypotension   2. Coronary artery disease involving native coronary artery of native heart without angina pectoris   3. ICD (implantable cardioverter-defibrillator) in place   4. Hyperlipidemia, unspecified hyperlipidemia type      PLAN:  In order of problems listed above:  Orthostatic hypotension spironolactone decreased to half tablet daily. Doing much better. Worked hard yest in heat and drank a lot of water. Mildly orthostatic in office today. Will stop spironolactone. F/u in 1-2 months  CAD status post anterior lateral STEMI treated with DES to the proximal LAD 11/2017 on aspirin Plavix Coreg and Lipitor-no angina. Cautioned him on heat precautions.   Ischemic cardiomyopathy ejection fraction 30 to 35% 12/2019. No CHF and feels about the same.  ILD followed by Dr. [name]  HLD LDL was 60 in 01/2020   Shared Decision Making/Informed Consent        Medication Adjustments/Labs and Tests Ordered: Current medicines are reviewed at length with the patient today.  Concerns regarding medicines are outlined above.   Medication changes, Labs and Tests ordered today are listed in the Patient Instructions below. Patient Instructions  Medication Instructions:   Stop Taking Spironolactone   *If you need a refill on your cardiac medications before your next appointment, please call your pharmacy*   Lab Work: NONE   If you have labs (blood work) drawn today and your tests are completely normal, you will receive your results only by: MyChart Message (if you have MyChart) OR A paper copy in the mail If you have any lab test that is abnormal or we need to change your treatment, we will call you to review the results.   Testing/Procedures: NONE    Follow-Up: At [facility], you and your health needs are our priority.  As part of our continuing mission to provide you with exceptional heart care, we have created designated Provider Care Teams.  These Care Teams include your primary Cardiologist (physician) and Advanced Practice Providers (APPs -  Physician Assistants and Nurse Practitioners) who all work together to provide you with the care you need, when you need it.  We recommend signing up for the patient portal called "MyChart".  Sign up information is provided on this After Visit Summary.  MyChart is used to connect with patients for Virtual Visits (Telemedicine).  Patients are able to view lab/test results, encounter notes, upcoming appointments, etc.  Non-urgent messages can be sent to your provider as well.   To learn more about what you can do with MyChart, go to [email or web address].    Your next appointment:   1-2 month(s)  The format for your next appointment:   In Person  Provider:   Jacolyn Reedy, PA-C   Other Instructions Thank you for choosing [facility]!     Elson Clan, PA-C  11/17/2020 12:54 PM    [facility] [address] [place], [place], [place]  [address] Phone: [phone]; Fax: [phone]

## 2020-11-17 ENCOUNTER — Ambulatory Visit: Payer: Medicare PPO | Admitting: Physician Assistant

## 2020-11-17 ENCOUNTER — Encounter: Payer: Self-pay | Admitting: Physician Assistant

## 2020-11-17 ENCOUNTER — Other Ambulatory Visit: Payer: Self-pay

## 2020-11-17 VITALS — BP 120/68 | HR 64 | Ht 73.0 in | Wt 202.6 lb

## 2020-11-17 DIAGNOSIS — E785 Hyperlipidemia, unspecified: Secondary | ICD-10-CM | POA: Diagnosis not present

## 2020-11-17 DIAGNOSIS — I951 Orthostatic hypotension: Secondary | ICD-10-CM | POA: Diagnosis not present

## 2020-11-17 DIAGNOSIS — I2510 Atherosclerotic heart disease of native coronary artery without angina pectoris: Secondary | ICD-10-CM

## 2020-11-17 DIAGNOSIS — Z95810 Presence of automatic (implantable) cardiac defibrillator: Secondary | ICD-10-CM | POA: Diagnosis not present

## 2020-11-17 NOTE — Patient Instructions (Signed)
Medication Instructions:   Stop Taking Spironolactone   *If you need a refill on your cardiac medications before your next appointment, please call your pharmacy*   Lab Work: NONE   If you have labs (blood work) drawn today and your tests are completely normal, you will receive your results only by: MyChart Message (if you have MyChart) OR A paper copy in the mail If you have any lab test that is abnormal or we need to change your treatment, we will call you to review the results.   Testing/Procedures: NONE    Follow-Up: At [facility], you and your health needs are our priority.  As part of our continuing mission to provide you with exceptional heart care, we have created designated Provider Care Teams.  These Care Teams include your primary Cardiologist (physician) and Advanced Practice Providers (APPs -  Physician Assistants and Nurse Practitioners) who all work together to provide you with the care you need, when you need it.  We recommend signing up for the patient portal called "MyChart".  Sign up information is provided on this After Visit Summary.  MyChart is used to connect with patients for Virtual Visits (Telemedicine).  Patients are able to view lab/test results, encounter notes, upcoming appointments, etc.  Non-urgent messages can be sent to your provider as well.   To learn more about what you can do with MyChart, go to [email or web address].    Your next appointment:   1-2 month(s)  The format for your next appointment:   In Person  Provider:   Jacolyn Reedy, PA-C   Other Instructions Thank you for choosing [facility]!

## 2020-12-03 ENCOUNTER — Ambulatory Visit (INDEPENDENT_AMBULATORY_CARE_PROVIDER_SITE_OTHER): Payer: Medicare PPO

## 2020-12-03 DIAGNOSIS — I255 Ischemic cardiomyopathy: Secondary | ICD-10-CM

## 2020-12-07 ENCOUNTER — Telehealth: Payer: Self-pay

## 2020-12-07 LAB — CUP PACEART REMOTE DEVICE CHECK
Battery Remaining Longevity: 105 mo
Battery Voltage: 3.01 V
Brady Statistic AP VP Percent: 0.05 %
Brady Statistic AP VS Percent: 44.24 %
Brady Statistic AS VP Percent: 0.03 %
Brady Statistic AS VS Percent: 55.68 %
Brady Statistic RA Percent Paced: 44.2 %
Brady Statistic RV Percent Paced: 0.08 %
Date Time Interrogation Session: 20221008063325
HighPow Impedance: 67 Ohm
Implantable Lead Implant Date: 20200930
Implantable Lead Implant Date: 20200930
Implantable Lead Location: 753859
Implantable Lead Location: 753860
Implantable Lead Model: 5076
Implantable Lead Model: 6935
Implantable Pulse Generator Implant Date: 20200930
Lead Channel Impedance Value: 285 Ohm
Lead Channel Impedance Value: 342 Ohm
Lead Channel Impedance Value: 456 Ohm
Lead Channel Pacing Threshold Amplitude: 0.75 V
Lead Channel Pacing Threshold Amplitude: 1 V
Lead Channel Pacing Threshold Pulse Width: 0.4 ms
Lead Channel Pacing Threshold Pulse Width: 0.4 ms
Lead Channel Sensing Intrinsic Amplitude: 15.125 mV
Lead Channel Sensing Intrinsic Amplitude: 15.125 mV
Lead Channel Sensing Intrinsic Amplitude: 2.25 mV
Lead Channel Sensing Intrinsic Amplitude: 2.25 mV
Lead Channel Setting Pacing Amplitude: 2 V
Lead Channel Setting Pacing Amplitude: 2.5 V
Lead Channel Setting Pacing Pulse Width: 0.4 ms
Lead Channel Setting Sensing Sensitivity: 0.3 mV

## 2020-12-07 NOTE — Telephone Encounter (Addendum)
Carelink alert received for elevated OptiVol level. EMR reviewed. History of symptomatic orthostatic hypotension. 11/17/20 aldactone was discontinued by cardiology. Unsuccessful telephone encounter to patient to follow up on s/s of fluid overload. Hipaa compliant VM message left requesting call back to [phone]. Will request manual remote transmission in 1 week to assess OptiVol trend.

## 2020-12-07 NOTE — Telephone Encounter (Signed)
Pt left a message on voicemail returning nurse phone call.

## 2020-12-07 NOTE — Telephone Encounter (Signed)
Returning patients phone call.    Patient denies any complaints of fluid retention.   Patient called and advised for remote check on 12/14/20. Advised to call back with any questions or concerns.

## 2020-12-14 ENCOUNTER — Ambulatory Visit (INDEPENDENT_AMBULATORY_CARE_PROVIDER_SITE_OTHER): Payer: Medicare PPO

## 2020-12-14 DIAGNOSIS — I255 Ischemic cardiomyopathy: Secondary | ICD-10-CM

## 2020-12-14 NOTE — Progress Notes (Signed)
Remote ICD transmission.   

## 2020-12-16 ENCOUNTER — Telehealth: Payer: Self-pay

## 2020-12-16 LAB — CUP PACEART REMOTE DEVICE CHECK
Battery Remaining Longevity: 105 mo
Battery Voltage: 3.02 V
Brady Statistic AP VP Percent: 0.02 %
Brady Statistic AP VS Percent: 39.8 %
Brady Statistic AS VP Percent: 0.03 %
Brady Statistic AS VS Percent: 60.15 %
Brady Statistic RA Percent Paced: 39.74 %
Brady Statistic RV Percent Paced: 0.05 %
Date Time Interrogation Session: 20221018082724
HighPow Impedance: 68 Ohm
Implantable Lead Implant Date: 20200930
Implantable Lead Implant Date: 20200930
Implantable Lead Location: 753859
Implantable Lead Location: 753860
Implantable Lead Model: 5076
Implantable Lead Model: 6935
Implantable Pulse Generator Implant Date: 20200930
Lead Channel Impedance Value: 285 Ohm
Lead Channel Impedance Value: 361 Ohm
Lead Channel Impedance Value: 456 Ohm
Lead Channel Pacing Threshold Amplitude: 0.625 V
Lead Channel Pacing Threshold Amplitude: 0.875 V
Lead Channel Pacing Threshold Pulse Width: 0.4 ms
Lead Channel Pacing Threshold Pulse Width: 0.4 ms
Lead Channel Sensing Intrinsic Amplitude: 15.75 mV
Lead Channel Sensing Intrinsic Amplitude: 15.75 mV
Lead Channel Sensing Intrinsic Amplitude: 2.25 mV
Lead Channel Sensing Intrinsic Amplitude: 2.25 mV
Lead Channel Setting Pacing Amplitude: 2 V
Lead Channel Setting Pacing Amplitude: 2.5 V
Lead Channel Setting Pacing Pulse Width: 0.4 ms
Lead Channel Setting Sensing Sensitivity: 0.3 mV

## 2020-12-16 NOTE — Telephone Encounter (Signed)
Called patient regarding symptoms of HF such as SOB, swelling in LE duet alert for ongoing elevated optivol since 11/29/20.  LVM on home phone, cell phone voicemail full

## 2020-12-20 NOTE — Telephone Encounter (Signed)
Spoke with patient regarding possible HF symptoms patient denied any increased SOB or LE swelling he stated that Herma Carson PA decreased spirolactone and that he had a f/u apt with her on 01/24/21, asked patient to send in a manual transmission the day prior, informed patient to call office if he started experiencing symptoms of HF such as increased SOB/ LE swelling. Well route to Herma Carson as Lorain Childes

## 2020-12-23 NOTE — Progress Notes (Signed)
Remote ICD transmission.   

## 2020-12-23 NOTE — Addendum Note (Signed)
Addended by: Elease Etienne A on: 12/23/2020 01:26 PM   Modules accepted: Level of Service

## 2021-01-03 ENCOUNTER — Other Ambulatory Visit: Payer: Self-pay | Admitting: Cardiology

## 2021-01-10 NOTE — Progress Notes (Signed)
Cardiology Office Note    Date:  01/24/2021   ID:  Thomas Mink., DOB 1940/06/21, MRN [id number]   PCP:  [name], MD   [facility]  Cardiologist:  [name], MD   Advanced Practice Provider:  No care team member to display Electrophysiologist:  [name], MD   [phone]   No chief complaint on file.   History of Present Illness:  Thomas Swoyer. is a 80 y.o. male  with history of CAD status post anterior lateral STEMI 11/2017 treated with DES to the proximal LAD, ischemic cardiomyopathy ejection fraction 30 to 35% on echo 12/2019 on Coreg Entresto and Aldactone, ICD followed by Dr. [name], HLD on statin.   Patient last saw Dr. Diona Browner 01/2020 was doing well.  ICD checked by Dr. [name] 02/2020 stable.   I saw the patient 10/04/20 with dizziness with standing and he was found to be orthostatic.  He had lost 9 pounds as well.  I decreased his spironolactone to half a tablet daily.  Labs checked and were all stable.  I saw the patient back 11/17/2020 and he was still mildly orthostatic.  I stopped spironolactone completely. Optivol level had gone up but patient was asymptomatic.  Patient comes in for f/u. No further dizziness, dyspnea, chest pain. Feels well.        Past Medical History:  Diagnosis Date   Chronic neck pain    Coronary artery disease    a. 11/2017: s/p anterolateral STEMI with cath showing severe thrombotic occlusion of proximal-LAD --> PCI/DES performed   Essential hypertension    Headache(784.0)    Hypercholesteremia    ICD (implantable cardioverter-defibrillator) in place    Medtronic, September 2020 - Dr. [name]   Ischemic cardiomyopathy    Migraine    Muscle spasms of neck    Myocardial infarction [facility])     Past Surgical History:  Procedure Laterality Date   CHOLECYSTECTOMY     1/12   COLONOSCOPY  03/15/2011   Procedure: COLONOSCOPY;  Surgeon: [name], MD;  [facility];  Service:  Endoscopy;  Laterality: N/A;  9:30 am   CORONARY STENT INTERVENTION N/A 12/03/2017   Procedure: CORONARY STENT INTERVENTION;  Surgeon: [name], MD;  Location: [facility] INVASIVE CV LAB;  Service: Cardiovascular;  Laterality: N/A;   ESOPHAGOGASTRODUODENOSCOPY N/A 06/26/2012   Procedure: ESOPHAGOGASTRODUODENOSCOPY (EGD);  Surgeon: [name], MD;  [facility];  Service: Endoscopy;  Laterality: N/A;   fractured jaw     fractured nose     ICD IMPLANT N/A 11/27/2018   Procedure: ICD IMPLANT;  Surgeon: [name], MD;  Location: [facility] INVASIVE CV LAB;  Service: Cardiovascular;  Laterality: N/A;   left acl repair     LEFT HEART CATH AND CORONARY ANGIOGRAPHY N/A 12/03/2017   Procedure: LEFT HEART CATH AND CORONARY ANGIOGRAPHY;  Surgeon: [name], MD;  Location: [facility] INVASIVE CV LAB;  Service: Cardiovascular;  Laterality: N/A;   Left Knee Arthroscopy     TIBIA FRACTURE SURGERY      Current Medications: Current Meds  Medication Sig   acetaminophen (TYLENOL) 325 MG tablet Take 650 mg by mouth every 6 (six) hours as needed for mild pain.   aspirin 81 MG EC tablet Take 1 tablet (81 mg total) by mouth daily.   atorvastatin (LIPITOR) 80 MG tablet TAKE ONE TABLET (80MG  TOTAL) BY MOUTH DAILY AT 6PM   carvedilol (COREG) 6.25 MG tablet TAKE ONE TABLET (  6.25MG  TOTAL) BY MOUTH TWO TIMES DAILY WITH A MEAL   clopidogrel (PLAVIX) 75 MG tablet TAKE ONE (1) TABLET BY MOUTH EVERY DAY   cyclobenzaprine (FLEXERIL) 10 MG tablet Take 10 mg by mouth daily as needed for muscle spasms.   ENTRESTO 24-26 MG TAKE ONE TABLET BY MOUTH TWICE A DAY   Melatonin 5 MG CAPS Take 10 mg by mouth at bedtime.   omeprazole (PRILOSEC) 20 MG capsule Take 20 mg by mouth every evening.   oxyCODONE-acetaminophen (PERCOCET) 10-325 MG per tablet Take 0.5-1 tablets by mouth daily as needed for pain (for migraine pain). migraines     Allergies:   Patient has no known allergies.   Social History   Socioeconomic History    Marital status: Married    Spouse name: Joy   Number of children: Not on file   Years of education: Not on file   Highest education level: Not on file  Occupational History   Occupation: Former TEFL teacher   Tobacco Use   Smoking status: Never   Smokeless tobacco: Never  Vaping Use   Vaping Use: Never used  Substance and Sexual Activity   Alcohol use: No   Drug use: No   Sexual activity: Not on file  Other Topics Concern   Not on file  Social History Narrative   Not on file   Social Determinants of Health   Financial Resource Strain: Not on file  Food Insecurity: Not on file  Transportation Needs: Not on file  Physical Activity: Not on file  Stress: Not on file  Social Connections: Not on file     Family History:  The patient's family history includes Cancer in his mother.   ROS:   Please see the history of present illness.    ROS All other systems reviewed and are negative.   PHYSICAL EXAM:   VS:  BP 110/68   Pulse 60   Ht 6\' 1"  (1.854 m)   Wt 209 lb (94.8 kg)   BMI 27.57 kg/m   Physical Exam  GEN: Well nourished, well developed, in no acute distress  Neck: no JVD, carotid bruits, or masses Cardiac:RRR; distant HS, 1/6 systolic murmur LSB Respiratory:  clear to auscultation bilaterally, normal work of breathing GI: soft, nontender, nondistended, + BS Ext: without cyanosis, clubbing, or edema, Good distal pulses bilaterally Neuro:  Alert and Oriented x 3,  Psych: euthymic mood, full affect  Wt Readings from Last 3 Encounters:  01/24/21 209 lb (94.8 kg)  11/17/20 202 lb 9.6 oz (91.9 kg)  10/04/20 205 lb (93 kg)      Studies/Labs Reviewed:   EKG:  EKG is not ordered today.     Recent Labs: 10/04/2020: BUN 18; Creatinine, Ser 0.84; Hemoglobin 12.5; Platelets 237; Potassium 4.4; Sodium 138   Lipid Panel    Component Value Date/Time   CHOL 200 12/04/2017 0437   TRIG 163 (H) 12/04/2017 0437   HDL 60 12/04/2017 0437   CHOLHDL 3.3 12/04/2017  0437   VLDL 33 12/04/2017 0437   LDLCALC 107 (H) 12/04/2017 0437    Additional studies/ records that were reviewed today include:  Echocardiogram 12/29/2019:  1. The entire apex is akinetic. 13/02/2019 Left ventricular ejection fraction, by  estimation, is 30 to 35%. The left ventricle has moderately decreased  function. The left ventricle demonstrates regional wall motion  abnormalities (see scoring diagram/findings for   description). There is moderate left ventricular hypertrophy. Left  ventricular diastolic parameters are consistent with Grade  I diastolic  dysfunction (impaired relaxation).   2. Right ventricular systolic function is normal. The right ventricular  size is normal.   3. The mitral valve is normal in structure. No evidence of mitral valve  regurgitation. No evidence of mitral stenosis.   4. The aortic valve is tricuspid. Aortic valve regurgitation is mild. No  aortic stenosis is present.         Risk Assessment/Calculations:         ASSESSMENT:    1. Orthostatic hypotension   2. Coronary artery disease involving native coronary artery of native heart without angina pectoris   3. Ischemic cardiomyopathy   4. ICD (implantable cardioverter-defibrillator) in place   5. Mixed hyperlipidemia      PLAN:  In order of problems listed above:  Orthostatic hypotension spironolactone decreased to half tablet daily. Doing much better. Worked hard yest in heat and drank a lot of water. Mildly orthostatic in office LOV and stopped spironolactone. Now much better without symptoms. Continue current treatment   CAD status post anterior lateral STEMI treated with DES to the proximal LAD 11/2017 on aspirin Plavix Coreg and Lipitor-no angina or bleeding problems on Plavix and ASA.     Ischemic cardiomyopathy ejection fraction 30 to 35% 12/2019. No CHF on examine. To send in optival reading today.   ICD followed by Dr. [name]   HLD LDL was 60 in 01/2020-due for f/u in Dec with  PCP      Shared Decision Making/Informed Consent        Medication Adjustments/Labs and Tests Ordered: Current medicines are reviewed at length with the patient today.  Concerns regarding medicines are outlined above.  Medication changes, Labs and Tests ordered today are listed in the Patient Instructions below. Patient Instructions  Medication Instructions:  Your physician recommends that you continue on your current medications as directed. Please refer to the Current Medication list given to you today.   Labwork: None today   Testing/Procedures: None today  Follow-Up: Dr.taylor in January, Dr.McDowell in 3-4 months   Any Other Special Instructions Will Be Listed Below (If Applicable).   Call in Pacer recording when you get home today.  If you need a refill on your cardiac medications before your next appointment, please call your pharmacy.   Elson Clan, PA-C  01/24/2021 1:47 PM    [facility] [address] [place], [place], [place]  [address] Phone: [phone]; Fax: [phone]

## 2021-01-18 DIAGNOSIS — Z23 Encounter for immunization: Secondary | ICD-10-CM | POA: Diagnosis not present

## 2021-01-19 DIAGNOSIS — D0439 Carcinoma in situ of skin of other parts of face: Secondary | ICD-10-CM | POA: Diagnosis not present

## 2021-01-19 DIAGNOSIS — D044 Carcinoma in situ of skin of scalp and neck: Secondary | ICD-10-CM | POA: Diagnosis not present

## 2021-01-19 DIAGNOSIS — Z85828 Personal history of other malignant neoplasm of skin: Secondary | ICD-10-CM | POA: Diagnosis not present

## 2021-01-19 DIAGNOSIS — L570 Actinic keratosis: Secondary | ICD-10-CM | POA: Diagnosis not present

## 2021-01-19 DIAGNOSIS — D485 Neoplasm of uncertain behavior of skin: Secondary | ICD-10-CM | POA: Diagnosis not present

## 2021-01-24 ENCOUNTER — Ambulatory Visit (INDEPENDENT_AMBULATORY_CARE_PROVIDER_SITE_OTHER): Payer: Medicare PPO

## 2021-01-24 ENCOUNTER — Ambulatory Visit: Payer: Medicare PPO | Admitting: Physician Assistant

## 2021-01-24 ENCOUNTER — Other Ambulatory Visit: Payer: Self-pay

## 2021-01-24 ENCOUNTER — Encounter: Payer: Self-pay | Admitting: Physician Assistant

## 2021-01-24 VITALS — BP 110/68 | HR 60 | Ht 73.0 in | Wt 209.0 lb

## 2021-01-24 DIAGNOSIS — I255 Ischemic cardiomyopathy: Secondary | ICD-10-CM

## 2021-01-24 DIAGNOSIS — I951 Orthostatic hypotension: Secondary | ICD-10-CM

## 2021-01-24 DIAGNOSIS — Z95810 Presence of automatic (implantable) cardiac defibrillator: Secondary | ICD-10-CM | POA: Diagnosis not present

## 2021-01-24 DIAGNOSIS — I2510 Atherosclerotic heart disease of native coronary artery without angina pectoris: Secondary | ICD-10-CM

## 2021-01-24 DIAGNOSIS — E782 Mixed hyperlipidemia: Secondary | ICD-10-CM | POA: Diagnosis not present

## 2021-01-24 NOTE — Patient Instructions (Signed)
Medication Instructions:  Your physician recommends that you continue on your current medications as directed. Please refer to the Current Medication list given to you today.   Labwork: None today   Testing/Procedures: None today  Follow-Up: Dr.taylor in January, Dr.McDowell in 3-4 months   Any Other Special Instructions Will Be Listed Below (If Applicable).   Call in Pacer recording when you get home today.  If you need a refill on your cardiac medications before your next appointment, please call your pharmacy.

## 2021-01-25 LAB — CUP PACEART REMOTE DEVICE CHECK
Battery Remaining Longevity: 104 mo
Battery Voltage: 3.01 V
Brady Statistic AP VP Percent: 0.04 %
Brady Statistic AP VS Percent: 43.26 %
Brady Statistic AS VP Percent: 0.03 %
Brady Statistic AS VS Percent: 56.67 %
Brady Statistic RA Percent Paced: 43.14 %
Brady Statistic RV Percent Paced: 0.07 %
Date Time Interrogation Session: 20221128121708
HighPow Impedance: 62 Ohm
Implantable Lead Implant Date: 20200930
Implantable Lead Implant Date: 20200930
Implantable Lead Location: 753859
Implantable Lead Location: 753860
Implantable Lead Model: 5076
Implantable Lead Model: 6935
Implantable Pulse Generator Implant Date: 20200930
Lead Channel Impedance Value: 285 Ohm
Lead Channel Impedance Value: 304 Ohm
Lead Channel Impedance Value: 475 Ohm
Lead Channel Pacing Threshold Amplitude: 0.625 V
Lead Channel Pacing Threshold Amplitude: 0.875 V
Lead Channel Pacing Threshold Pulse Width: 0.4 ms
Lead Channel Pacing Threshold Pulse Width: 0.4 ms
Lead Channel Sensing Intrinsic Amplitude: 14.5 mV
Lead Channel Sensing Intrinsic Amplitude: 14.5 mV
Lead Channel Sensing Intrinsic Amplitude: 2.125 mV
Lead Channel Sensing Intrinsic Amplitude: 2.125 mV
Lead Channel Setting Pacing Amplitude: 2 V
Lead Channel Setting Pacing Amplitude: 2.5 V
Lead Channel Setting Pacing Pulse Width: 0.4 ms
Lead Channel Setting Sensing Sensitivity: 0.3 mV

## 2021-02-01 DIAGNOSIS — I2510 Atherosclerotic heart disease of native coronary artery without angina pectoris: Secondary | ICD-10-CM | POA: Diagnosis not present

## 2021-02-01 DIAGNOSIS — E785 Hyperlipidemia, unspecified: Secondary | ICD-10-CM | POA: Diagnosis not present

## 2021-02-01 DIAGNOSIS — Z79899 Other long term (current) drug therapy: Secondary | ICD-10-CM | POA: Diagnosis not present

## 2021-02-01 DIAGNOSIS — I255 Ischemic cardiomyopathy: Secondary | ICD-10-CM | POA: Diagnosis not present

## 2021-02-01 DIAGNOSIS — K219 Gastro-esophageal reflux disease without esophagitis: Secondary | ICD-10-CM | POA: Diagnosis not present

## 2021-02-01 DIAGNOSIS — G43909 Migraine, unspecified, not intractable, without status migrainosus: Secondary | ICD-10-CM | POA: Diagnosis not present

## 2021-02-02 NOTE — Progress Notes (Signed)
Remote ICD transmission.   

## 2021-02-02 NOTE — Addendum Note (Signed)
Addended by: Elease Etienne A on: 02/02/2021 08:57 AM   Modules accepted: Level of Service

## 2021-02-08 DIAGNOSIS — I2510 Atherosclerotic heart disease of native coronary artery without angina pectoris: Secondary | ICD-10-CM | POA: Diagnosis not present

## 2021-02-08 DIAGNOSIS — E785 Hyperlipidemia, unspecified: Secondary | ICD-10-CM | POA: Diagnosis not present

## 2021-02-08 DIAGNOSIS — Z6827 Body mass index (BMI) 27.0-27.9, adult: Secondary | ICD-10-CM | POA: Diagnosis not present

## 2021-02-08 DIAGNOSIS — Z95810 Presence of automatic (implantable) cardiac defibrillator: Secondary | ICD-10-CM | POA: Diagnosis not present

## 2021-02-11 DIAGNOSIS — L570 Actinic keratosis: Secondary | ICD-10-CM | POA: Diagnosis not present

## 2021-02-11 DIAGNOSIS — Z85828 Personal history of other malignant neoplasm of skin: Secondary | ICD-10-CM | POA: Diagnosis not present

## 2021-02-11 DIAGNOSIS — D0430 Carcinoma in situ of skin of unspecified part of face: Secondary | ICD-10-CM | POA: Diagnosis not present

## 2021-02-11 DIAGNOSIS — D044 Carcinoma in situ of skin of scalp and neck: Secondary | ICD-10-CM | POA: Diagnosis not present

## 2021-02-22 ENCOUNTER — Encounter (INDEPENDENT_AMBULATORY_CARE_PROVIDER_SITE_OTHER): Payer: Self-pay | Admitting: *Deleted

## 2021-03-03 ENCOUNTER — Encounter: Payer: Medicare PPO | Admitting: Internal Medicine

## 2021-03-04 ENCOUNTER — Ambulatory Visit (INDEPENDENT_AMBULATORY_CARE_PROVIDER_SITE_OTHER): Payer: Medicare PPO

## 2021-03-04 DIAGNOSIS — I255 Ischemic cardiomyopathy: Secondary | ICD-10-CM

## 2021-03-06 LAB — CUP PACEART REMOTE DEVICE CHECK
Battery Remaining Longevity: 102 mo
Battery Voltage: 3.01 V
Brady Statistic AP VP Percent: 0.02 %
Brady Statistic AP VS Percent: 36.69 %
Brady Statistic AS VP Percent: 0.02 %
Brady Statistic AS VS Percent: 63.26 %
Brady Statistic RA Percent Paced: 36.5 %
Brady Statistic RV Percent Paced: 0.05 %
Date Time Interrogation Session: 20230108072403
HighPow Impedance: 64 Ohm
Implantable Lead Implant Date: 20200930
Implantable Lead Implant Date: 20200930
Implantable Lead Location: 753859
Implantable Lead Location: 753860
Implantable Lead Model: 5076
Implantable Lead Model: 6935
Implantable Pulse Generator Implant Date: 20200930
Lead Channel Impedance Value: 285 Ohm
Lead Channel Impedance Value: 304 Ohm
Lead Channel Impedance Value: 456 Ohm
Lead Channel Pacing Threshold Amplitude: 0.625 V
Lead Channel Pacing Threshold Amplitude: 0.875 V
Lead Channel Pacing Threshold Pulse Width: 0.4 ms
Lead Channel Pacing Threshold Pulse Width: 0.4 ms
Lead Channel Sensing Intrinsic Amplitude: 14 mV
Lead Channel Sensing Intrinsic Amplitude: 14 mV
Lead Channel Sensing Intrinsic Amplitude: 2.25 mV
Lead Channel Sensing Intrinsic Amplitude: 2.25 mV
Lead Channel Setting Pacing Amplitude: 2 V
Lead Channel Setting Pacing Amplitude: 2.5 V
Lead Channel Setting Pacing Pulse Width: 0.4 ms
Lead Channel Setting Sensing Sensitivity: 0.3 mV

## 2021-03-15 ENCOUNTER — Encounter: Payer: Self-pay | Admitting: Internal Medicine

## 2021-03-15 ENCOUNTER — Other Ambulatory Visit: Payer: Self-pay

## 2021-03-15 ENCOUNTER — Ambulatory Visit (INDEPENDENT_AMBULATORY_CARE_PROVIDER_SITE_OTHER): Payer: Medicare PPO | Admitting: Internal Medicine

## 2021-03-15 VITALS — BP 120/80 | HR 79 | Ht 72.0 in | Wt 214.3 lb

## 2021-03-15 DIAGNOSIS — I5022 Chronic systolic (congestive) heart failure: Secondary | ICD-10-CM | POA: Diagnosis not present

## 2021-03-15 NOTE — Progress Notes (Signed)
HPI [name] returns today for ongoing evaluation and management of his ICD in the setting of an ICM. He is s/p MI over 2 years ago. Since his ICD insertion, he has done well. He denies chest pain, sob, or ICD therapy. He has managed to avoid Covid 19. He remains active maintaining his church. No Known Allergies   Current Outpatient Medications  Medication Sig Dispense Refill   acetaminophen (TYLENOL) 325 MG tablet Take 650 mg by mouth every 6 (six) hours as needed for mild pain.     aspirin 81 MG EC tablet Take 1 tablet (81 mg total) by mouth daily. 30 tablet 0   atorvastatin (LIPITOR) 80 MG tablet TAKE ONE TABLET (80MG  TOTAL) BY MOUTH DAILY AT 6PM 90 tablet 3   carvedilol (COREG) 6.25 MG tablet TAKE ONE TABLET (6.25MG  TOTAL) BY MOUTH TWO TIMES DAILY WITH A MEAL 180 tablet 3   clopidogrel (PLAVIX) 75 MG tablet TAKE ONE (1) TABLET BY MOUTH EVERY DAY 90 tablet 3   cyclobenzaprine (FLEXERIL) 10 MG tablet Take 10 mg by mouth daily as needed for muscle spasms.     docusate sodium (COLACE) 100 MG capsule Take 100 mg by mouth at bedtime.     ENTRESTO 24-26 MG TAKE ONE TABLET BY MOUTH TWICE A DAY 60 tablet 11   Melatonin 5 MG CAPS Take 10 mg by mouth at bedtime.     omeprazole (PRILOSEC) 20 MG capsule Take 20 mg by mouth every evening.     oxyCODONE-acetaminophen (PERCOCET) 10-325 MG per tablet Take 0.5-1 tablets by mouth daily as needed for pain (for migraine pain). migraines     No current facility-administered medications for this visit.     Past Medical History:  Diagnosis Date   Chronic neck pain    Coronary artery disease    a. 11/2017: s/p anterolateral STEMI with cath showing severe thrombotic occlusion of proximal-LAD --> PCI/DES performed   Essential hypertension    Headache(784.0)    Hypercholesteremia    ICD (implantable cardioverter-defibrillator) in place    [employer]tr[employer], September 2020 - Dr. October 2020   Ischemic cardiomyopathy    Migraine    Muscle spasms of neck     Myocardial infarction (HCC)     ROS:   All systems reviewed and negative except as noted in the HPI.   Past Surgical History:  Procedure Laterality Date   CHOLECYSTECTOMY     1/12   COLONOSCOPY  03/15/2011   Procedure: COLONOSCOPY;  Surgeon: 03/17/2011, MD;  [facility];  Service: Endoscopy;  Laterality: N/A;  9:30 am   CORONARY STENT INTERVENTION N/A 12/03/2017   Procedure: CORONARY STENT INTERVENTION;  Surgeon: 02/02/2018, MD;  Location: [facility] INVASIVE CV LAB;  Service: Cardiovascular;  Laterality: N/A;   ESOPHAGOGASTRODUODENOSCOPY N/A 06/26/2012   Procedure: ESOPHAGOGASTRODUODENOSCOPY (EGD);  Surgeon: 06/28/2012, MD;  [facility];  Service: Endoscopy;  Laterality: N/A;   fractured jaw     fractured nose     ICD IMPLANT N/A 11/27/2018   Procedure: ICD IMPLANT;  Surgeon: 11/29/2018, MD;  Location: [facility] INVASIVE CV LAB;  Service: Cardiovascular;  Laterality: N/A;   left acl repair     LEFT HEART CATH AND CORONARY ANGIOGRAPHY N/A 12/03/2017   Procedure: LEFT HEART CATH AND CORONARY ANGIOGRAPHY;  Surgeon: 02/02/2018, MD;  Location: [facility] INVASIVE CV LAB;  Service: Cardiovascular;  Laterality: N/A;   Left Knee Arthroscopy     TIBIA FRACTURE  SURGERY       Family History  Problem Relation Age of Onset   Cancer Mother    Colon cancer Neg Hx    Heart disease Neg Hx      Social History   Socioeconomic History   Marital status: Married    Spouse name: Joy   Number of children: Not on file   Years of education: Not on file   Highest education level: Not on file  Occupational History   Occupation: Former TEFL teacher   Tobacco Use   Smoking status: Never   Smokeless tobacco: Never  Vaping Use   Vaping Use: Never used  Substance and Sexual Activity   Alcohol use: No   Drug use: No   Sexual activity: Not on file  Other Topics Concern   Not on file  Social History Narrative   Not on file   Social Determinants of Health    Financial Resource Strain: Not on file  Food Insecurity: Not on file  Transportation Needs: Not on file  Physical Activity: Not on file  Stress: Not on file  Social Connections: Not on file  Intimate Partner Violence: Not on file     BP 120/80    Pulse 79    Ht 6' (1.829 m)    Wt 214 lb 4.8 oz (97.2 kg)    SpO2 95%    BMI 29.06 kg/m   Physical Exam:  Well appearing NAD HEENT: Unremarkable Neck:  No JVD, no thyromegally Lymphatics:  No adenopathy Back:  No CVA tenderness Lungs:  Clear HEART:  Regular rate rhythm, no murmurs, no rubs, no clicks Abd:  soft, positive bowel sounds, no organomegally, no rebound, no guarding Ext:  2 plus pulses, no edema, no cyanosis, no clubbing Skin:  No rashes no nodules Neuro:  CN II through XII intact, motor grossly intact  DEVICE  Normal device function.  See PaceArt for details.   Assess/Plan:  1. Chronic systolic heart failure -His symptoms remain class 2A. He will continue his current meds. 2. CAD - he denies anginal symptoms. He is encouraged to increase his activity. 3. ICD - his medtronic dual chamber ICD is working normally. We will recheck in several months.  4. Dyslipidemia - he will continue high dose statin therapy with lipitor.   Sharlot Gowda Saw Mendenhall,MD

## 2021-03-15 NOTE — Patient Instructions (Signed)
Medication Instructions:  Your physician recommends that you continue on your current medications as directed. Please refer to the Current Medication list given to you today.  *If you need a refill on your cardiac medications before your next appointment, please call your pharmacy*   Lab Work: NONE   If you have labs (blood work) drawn today and your tests are completely normal, you will receive your results only by: . MyChart Message (if you have MyChart) OR . A paper copy in the mail If you have any lab test that is abnormal or we need to change your treatment, we will call you to review the results.   Testing/Procedures: NONE    Follow-Up: At [facility], you and your health needs are our priority.  As part of our continuing mission to provide you with exceptional heart care, we have created designated Provider Care Teams.  These Care Teams include your primary Cardiologist (physician) and Advanced Practice Providers (APPs -  Physician Assistants and Nurse Practitioners) who all work together to provide you with the care you need, when you need it.  We recommend signing up for the patient portal called "MyChart".  Sign up information is provided on this After Visit Summary.  MyChart is used to connect with patients for Virtual Visits (Telemedicine).  Patients are able to view lab/test results, encounter notes, upcoming appointments, etc.  Non-urgent messages can be sent to your provider as well.   To learn more about what you can do with MyChart, go to [email or web address].    Your next appointment:   1 year(s)  The format for your next appointment:   In Person  Provider:   Gregg Taylor, MD   Other Instructions Thank you for choosing [facility]!    

## 2021-03-15 NOTE — Progress Notes (Signed)
Remote ICD transmission.   

## 2021-03-23 ENCOUNTER — Other Ambulatory Visit: Payer: Self-pay | Admitting: Cardiology

## 2021-03-29 ENCOUNTER — Encounter (INDEPENDENT_AMBULATORY_CARE_PROVIDER_SITE_OTHER): Payer: Self-pay | Admitting: *Deleted

## 2021-03-29 ENCOUNTER — Telehealth: Payer: Self-pay | Admitting: *Deleted

## 2021-03-29 ENCOUNTER — Telehealth (INDEPENDENT_AMBULATORY_CARE_PROVIDER_SITE_OTHER): Payer: Self-pay | Admitting: *Deleted

## 2021-03-29 MED ORDER — PEG 3350-KCL-NA BICARB-NACL 420 G PO SOLR: 4000.0000 mL | Freq: Once | ORAL | 0 refills | Status: AC

## 2021-03-29 NOTE — Telephone Encounter (Signed)
° °  Pre-operative Risk Assessment    Patient Name: Thomas Black.  DOB: 1940/05/01 MRN: [id number]      Request for Surgical Clearance    Procedure:   COLONOSCOPY   Date of Surgery:  Clearance 05/11/21                                 Surgeon:  DR. Hildred Laser Surgeon's Group or Practice Name:  [facility] Phone number:  [phone] Fax number:   [phone]   Type of Clearance Requested:   - Medical  - Pharmacy:  Hold Aspirin and Clopidogrel (Plavix) ASPIRIN X'S 2 DAYS PLAVIX 'S 5 DAYS   Type of Anesthesia:  MAC   Additional requests/questions:  Please fax a copy of CLEARANCE  to the surgeon's office.  Signed, Jeanann Lewandowsky   03/29/2021, 12:52 PM

## 2021-03-29 NOTE — Telephone Encounter (Signed)
Patient needs trilyte 

## 2021-03-29 NOTE — Telephone Encounter (Signed)
° °  Primary Cardiologist: [name], MD  Chart reviewed as part of pre-operative protocol coverage. Given past medical history and time since last visit, based on ACC/AHA guidelines, Thomas Black. would be at acceptable risk for the planned procedure without further cardiovascular testing.   His aspirin may be held for 2 days and his Plavix may be held for 5 days prior to his procedure.  Please resume as soon as hemostasis is achieved.  I will route this recommendation to the requesting party via Epic fax function and remove from pre-op pool.  Please call with questions.  [name]. Raziah Funnell NP-C    03/29/2021, 1:18 PM [facility] [address] Office [phone] Fax [phone]

## 2021-03-30 ENCOUNTER — Other Ambulatory Visit (INDEPENDENT_AMBULATORY_CARE_PROVIDER_SITE_OTHER): Payer: Self-pay

## 2021-03-30 DIAGNOSIS — Z1211 Encounter for screening for malignant neoplasm of colon: Secondary | ICD-10-CM

## 2021-03-31 ENCOUNTER — Telehealth (INDEPENDENT_AMBULATORY_CARE_PROVIDER_SITE_OTHER): Payer: Self-pay | Admitting: *Deleted

## 2021-03-31 NOTE — Telephone Encounter (Signed)
Per Dr Diona Browner it is ok for patient to hold ASA 2 days & Plavix 5 days prior to TCS sch'd 05/11/21 - patient aware

## 2021-04-14 ENCOUNTER — Telehealth (INDEPENDENT_AMBULATORY_CARE_PROVIDER_SITE_OTHER): Payer: Self-pay | Admitting: *Deleted

## 2021-04-14 NOTE — Telephone Encounter (Signed)
Referring MD/PCP: fagan  Procedure: tcs  Reason/Indication:  screening  Has patient had this procedure before?  Yes, 02/2011  If so, when, by whom and where?    Is there a family history of colon cancer?  no  Who?  What age when diagnosed?    Is patient diabetic? If yes, Type 1 or Type 2   no      Does patient have prosthetic heart valve or mechanical valve?  no  Do you have a pacemaker/defibrillator?  yes  Has patient ever had endocarditis/atrial fibrillation? no  Does patient use oxygen? no  Has patient had joint replacement within last 12 months?  no  Is patient constipated or do they take laxatives? no  Does patient have a history of alcohol/drug use?  no  Have you had a stroke/heart attack last 6 mths? no  Do you take medicine for weight loss?  no  For male patients,: have you had a hysterectomy                       are you post menopausal                       do you still have your menstrual cycle   Is patient on blood thinner such as Coumadin, Plavix and/or Aspirin? yes  Medications: tylenol prn, asa 81 mg daily, atorvastatin 80 mg daily, carvedilol 6.25 mg bid, plavix 75 mg daily, cyclobenzaprine 10 mg daily, colace 100 mg daily, melatonin 5 mg daily, omeprazole 20 mg daily, oxycodone 10/325 prn for migraines, entresto 24/26 bid, spironolactone 25 mg daily  Allergies: nkda  Medication Adjustment per Dr Rehman/Dr Levon Hedger asa 2 days & plavix 5 days (see note in epic for blood thinner)  Procedure date & time: 05/11/21

## 2021-05-04 NOTE — Patient Instructions (Signed)
? ? ? ? ? ? Thomas Black. ? 05/04/2021  ?  ? @PREFPERIOPPHARMACY @ ? ? Your procedure is scheduled on  05/11/2021. ? ? Report to [facility] at  0700  A.M. ? ? Call this number if you have problems the morning of surgery: ? [phone] ? ? Remember: ? Follow the diet and prep instructions given to you by the office. ? ?  Your last dose of plavix should have been 05/05/2021. ? ?  Your last dose of aspirin should be 05/08/2021. ? ?  ? Take these medicines the morning of surgery with A SIP OF WATER  ? ?   carvedilol, flexeril(if needed), entresto, oxycodone. ?  ? ? Do not wear jewelry, make-up or nail polish. ? Do not wear lotions, powders, or perfumes, or deodorant. ? Do not shave 48 hours prior to surgery.  Men may shave face and neck. ? Do not bring valuables to the hospital. ? [facility] is not responsible for any belongings or valuables. ? ?Contacts, dentures or bridgework may not be worn into surgery.  Leave your suitcase in the car.  After surgery it may be brought to your room. ? ?For patients admitted to the hospital, discharge time will be determined by your treatment team. ? ?Patients discharged the day of surgery will not be allowed to drive home and must have someone with them for 24 hours.  ? ? ?Special instructions:   DO NOT smoke tobacco or vape for 24 hours before your procedure. ? ?Please read over the following fact sheets that you were given. ?Anesthesia Post-op Instructions and Care and Recovery After Surgery ?  ? ? ? Colonoscopy, Adult, Care After ?This sheet gives you information about how to care for yourself after your procedure. Your health care provider may also give you more specific instructions. If you have problems or questions, contact your health care provider. ?What can I expect after the procedure? ?After the procedure, it is common to have: ?A small amount of blood in your stool for 24 hours after the procedure. ?Some gas. ?Mild cramping or bloating of your abdomen. ?Follow these  instructions at home: ?Eating and drinking ? ?Drink enough fluid to keep your urine pale yellow. ?Follow instructions from your health care provider about eating or drinking restrictions. ?Resume your normal diet as instructed by your health care provider. Avoid heavy or fried foods that are hard to digest. ?Activity ?Rest as told by your health care provider. ?Avoid sitting for a long time without moving. Get up to take short walks every 1-2 hours. This is important to improve blood flow and breathing. Ask for help if you feel weak or unsteady. ?Return to your normal activities as told by your health care provider. Ask your health care provider what activities are safe for you. ?Managing cramping and bloating ? ?Try walking around when you have cramps or feel bloated. ?Apply heat to your abdomen as told by your health care provider. Use the heat source that your health care provider recommends, such as a moist heat pack or a heating pad. ?Place a towel between your skin and the heat source. ?Leave the heat on for 20-30 minutes. ?Remove the heat if your skin turns bright red. This is especially important if you are unable to feel pain, heat, or cold. You may have a greater risk of getting burned. ?General instructions ?If you were given a sedative during the procedure, it can affect you for several hours. Do not drive  or operate machinery until your health care provider says that it is safe. ?For the first 24 hours after the procedure: ?Do not sign important documents. ?Do not drink alcohol. ?Do your regular daily activities at a slower pace than normal. ?Eat soft foods that are easy to digest. ?Take over-the-counter and prescription medicines only as told by your health care provider. ?Keep all follow-up visits as told by your health care provider. This is important. ?Contact a health care provider if: ?You have blood in your stool 2-3 days after the procedure. ?Get help right away if you have: ?More than a small  spotting of blood in your stool. ?Large blood clots in your stool. ?Swelling of your abdomen. ?Nausea or vomiting. ?A fever. ?Increasing pain in your abdomen that is not relieved with medicine. ?Summary ?After the procedure, it is common to have a small amount of blood in your stool. You may also have mild cramping and bloating of your abdomen. ?If you were given a sedative during the procedure, it can affect you for several hours. Do not drive or operate machinery until your health care provider says that it is safe. ?Get help right away if you have a lot of blood in your stool, nausea or vomiting, a fever, or increased pain in your abdomen. ?This information is not intended to replace advice given to you by your health care provider. Make sure you discuss any questions you have with your health care provider. ?Document Revised: 12/20/2018 Document Reviewed: 09/09/2018 ?Elsevier Patient Education ? 2022 Else[employer] ?Monitored Anesthesia Care, Care After ?This sheet gives you information about how to care for yourself after your procedure. Your health care provider may also give you more specific instructions. If you have problems or questions, contact your health care provider. ?What can I expect after the procedure? ?After the procedure, it is common to have: ?Tiredness. ?Forgetfulness about what happened after the procedure. ?Impaired judgment for important decisions. ?Nausea or vomiting. ?Some difficulty with balance. ?Follow these instructions at home: ?For the time period you were told by your health care provider: ?  ?Rest as needed. ?Do not participate in activities where you could fall or become injured. ?Do not drive or use machinery. ?Do not drink alcohol. ?Do not take sleeping pills or medicines that cause drowsiness. ?Do not make important decisions or sign legal documents. ?Do not take care of children on your own. ?Eating and drinking ?Follow the diet that is recommended by your health care  provider. ?Drink enough fluid to keep your urine pale yellow. ?If you vomit: ?Drink water, juice, or soup when you can drink without vomiting. ?Make sure you have little or no nausea before eating solid foods. ?General instructions ?Have a responsible adult stay with you for the time you are told. It is important to have someone help care for you until you are awake and alert. ?Take over-the-counter and prescription medicines only as told by your health care provider. ?If you have sleep apnea, surgery and certain medicines can increase your risk for breathing problems. Follow instructions from your health care provider about wearing your sleep device: ?Anytime you are sleeping, including during daytime naps. ?While taking prescription pain medicines, sleeping medicines, or medicines that make you drowsy. ?Avoid smoking. ?Keep all follow-up visits as told by your health care provider. This is important. ?Contact a health care provider if: ?You keep feeling nauseous or you keep vomiting. ?You feel light-headed. ?You are still sleepy or having trouble with balance after 24 hours. ?  You develop a rash. ?You have a fever. ?You have redness or swelling around the IV site. ?Get help right away if: ?You have trouble breathing. ?You have new-onset confusion at home. ?Summary ?For several hours after your procedure, you may feel tired. You may also be forgetful and have poor judgment. ?Have a responsible adult stay with you for the time you are told. It is important to have someone help care for you until you are awake and alert. ?Rest as told. Do not drive or operate machinery. Do not drink alcohol or take sleeping pills. ?Get help right away if you have trouble breathing, or if you suddenly become confused. ?This information is not intended to replace advice given to you by your health care provider. Make sure you discuss any questions you have with your health care provider. ?Document Revised: 10/30/2019 Document Reviewed:  01/16/2019 ?Elsevier Patient Education ? 2022 Elsevier Inc. ? ?

## 2021-05-05 ENCOUNTER — Ambulatory Visit: Payer: Medicare PPO | Admitting: Cardiology

## 2021-05-09 ENCOUNTER — Encounter (HOSPITAL_COMMUNITY)
Admission: RE | Admit: 2021-05-09 | Discharge: 2021-05-09 | Disposition: A | Discharge status: Home / Self Care | Payer: Medicare PPO | Source: Ambulatory Visit | Attending: Internal Medicine | Admitting: Internal Medicine

## 2021-05-09 ENCOUNTER — Encounter (HOSPITAL_COMMUNITY): Payer: Self-pay

## 2021-05-09 DIAGNOSIS — Z1211 Encounter for screening for malignant neoplasm of colon: Secondary | ICD-10-CM

## 2021-05-09 DIAGNOSIS — Z01812 Encounter for preprocedural laboratory examination: Secondary | ICD-10-CM | POA: Diagnosis not present

## 2021-05-09 HISTORY — DX: Presence of automatic (implantable) cardiac defibrillator: Z95.810

## 2021-05-09 LAB — BASIC METABOLIC PANEL
Anion gap: 7 (ref 5–15)
BUN: 16 mg/dL (ref 8–23)
CO2: 27 mmol/L (ref 22–32)
Calcium: 8.9 mg/dL (ref 8.9–10.3)
Chloride: 103 mmol/L (ref 98–111)
Creatinine, Ser: 0.96 mg/dL (ref 0.61–1.24)
GFR, Estimated: >60 mL/min (ref >60)
Glucose, Bld: 120 mg/dL — ABNORMAL HIGH (ref 70–99)
Potassium: 4.5 mmol/L (ref 3.5–5.1)
Sodium: 137 mmol/L (ref 135–145)

## 2021-05-11 ENCOUNTER — Ambulatory Visit (HOSPITAL_COMMUNITY)
Admission: RE | Admit: 2021-05-11 | Discharge: 2021-05-11 | Disposition: A | Discharge status: Home / Self Care | Payer: Medicare PPO | Attending: Internal Medicine | Admitting: Internal Medicine

## 2021-05-11 ENCOUNTER — Encounter (HOSPITAL_COMMUNITY)
Admission: RE | Disposition: A | Discharge status: Home / Self Care | Payer: Self-pay | Source: Home / Self Care | Attending: Internal Medicine

## 2021-05-11 ENCOUNTER — Ambulatory Visit (HOSPITAL_BASED_OUTPATIENT_CLINIC_OR_DEPARTMENT_OTHER): Payer: Medicare PPO | Admitting: Anesthesiology

## 2021-05-11 ENCOUNTER — Ambulatory Visit (HOSPITAL_COMMUNITY): Payer: Medicare PPO | Admitting: Anesthesiology

## 2021-05-11 ENCOUNTER — Encounter (HOSPITAL_COMMUNITY): Payer: Self-pay | Admitting: Internal Medicine

## 2021-05-11 DIAGNOSIS — K648 Other hemorrhoids: Secondary | ICD-10-CM | POA: Diagnosis not present

## 2021-05-11 DIAGNOSIS — I10 Essential (primary) hypertension: Secondary | ICD-10-CM | POA: Insufficient documentation

## 2021-05-11 DIAGNOSIS — I700 Atherosclerosis of aorta: Secondary | ICD-10-CM | POA: Diagnosis not present

## 2021-05-11 DIAGNOSIS — Z95810 Presence of automatic (implantable) cardiac defibrillator: Secondary | ICD-10-CM | POA: Diagnosis not present

## 2021-05-11 DIAGNOSIS — K5730 Diverticulosis of large intestine without perforation or abscess without bleeding: Secondary | ICD-10-CM

## 2021-05-11 DIAGNOSIS — Z7982 Long term (current) use of aspirin: Secondary | ICD-10-CM | POA: Diagnosis not present

## 2021-05-11 DIAGNOSIS — K644 Residual hemorrhoidal skin tags: Secondary | ICD-10-CM | POA: Insufficient documentation

## 2021-05-11 DIAGNOSIS — K6289 Other specified diseases of anus and rectum: Secondary | ICD-10-CM

## 2021-05-11 DIAGNOSIS — Z1211 Encounter for screening for malignant neoplasm of colon: Secondary | ICD-10-CM

## 2021-05-11 DIAGNOSIS — Z955 Presence of coronary angioplasty implant and graft: Secondary | ICD-10-CM | POA: Diagnosis not present

## 2021-05-11 DIAGNOSIS — Z79899 Other long term (current) drug therapy: Secondary | ICD-10-CM | POA: Insufficient documentation

## 2021-05-11 DIAGNOSIS — I2510 Atherosclerotic heart disease of native coronary artery without angina pectoris: Secondary | ICD-10-CM | POA: Diagnosis not present

## 2021-05-11 DIAGNOSIS — I252 Old myocardial infarction: Secondary | ICD-10-CM | POA: Insufficient documentation

## 2021-05-11 DIAGNOSIS — Z7902 Long term (current) use of antithrombotics/antiplatelets: Secondary | ICD-10-CM | POA: Diagnosis not present

## 2021-05-11 DIAGNOSIS — I110 Hypertensive heart disease with heart failure: Secondary | ICD-10-CM | POA: Diagnosis not present

## 2021-05-11 DIAGNOSIS — I509 Heart failure, unspecified: Secondary | ICD-10-CM | POA: Diagnosis not present

## 2021-05-11 HISTORY — PX: COLONOSCOPY WITH PROPOFOL: SHX5780

## 2021-05-11 LAB — HM COLONOSCOPY

## 2021-05-11 SURGERY — COLONOSCOPY WITH PROPOFOL
Anesthesia: General

## 2021-05-11 MED ORDER — PROPOFOL 10 MG/ML IV BOLUS
INTRAVENOUS | Status: DC | PRN
  Administered 2021-05-11: 100 mg via INTRAVENOUS
  Administered 2021-05-11: 20 mg via INTRAVENOUS
  Administered 2021-05-11: 10 mg via INTRAVENOUS
  Administered 2021-05-11: 20 mg via INTRAVENOUS

## 2021-05-11 MED ORDER — PROPOFOL 500 MG/50ML IV EMUL
INTRAVENOUS | Status: DC | PRN
  Administered 2021-05-11: 150 ug/kg/min via INTRAVENOUS

## 2021-05-11 MED ORDER — LACTATED RINGERS IV SOLN: INTRAVENOUS | Status: DC

## 2021-05-11 MED ORDER — LIDOCAINE HCL 1 % IJ SOLN
INTRAMUSCULAR | Status: DC | PRN
  Administered 2021-05-11: 50 mg via INTRADERMAL

## 2021-05-11 MED ORDER — PROPOFOL 1000 MG/100ML IV EMUL
INTRAVENOUS | Status: AC
  Filled 2021-05-11: qty 100

## 2021-05-11 NOTE — Anesthesia Preprocedure Evaluation (Signed)
Anesthesia Evaluation  ?Patient identified by MRN, date of birth, ID band ?Patient awake ? ? ? ?Reviewed: ?Allergy & Precautions, H&P , NPO status , Patient's Chart, lab work & pertinent test results, reviewed documented beta blocker date and time  ? ?Airway ?Mallampati: II ? ?TM Distance: >3 FB ?Neck ROM: full ? ? ? Dental ?no notable dental hx. ? ?  ?Pulmonary ?neg pulmonary ROS,  ?  ?Pulmonary exam normal ?breath sounds clear to auscultation ? ? ? ? ? ? Cardiovascular ?Exercise Tolerance: Good ?hypertension, + CAD, + Past MI, + Cardiac Stents and +CHF  ?+ Cardiac Defibrillator ? ?Rhythm:regular Rate:Normal ? ? ?  ?Neuro/Psych ? Headaches,  Neuromuscular disease negative psych ROS  ? GI/Hepatic ?negative GI ROS, Neg liver ROS,   ?Endo/Other  ?negative endocrine ROS ? Renal/GU ?negative Renal ROS  ?negative genitourinary ?  ?Musculoskeletal ? ? Abdominal ?  ?Peds ? Hematology ?negative hematology ROS ?(+)   ?Anesthesia Other Findings ?1. The entire apex is akinetic. Marland Kitchen Left ventricular ejection fraction, by  ?estimation, is 30 to 35%. The left ventricle has moderately decreased  ?function. The left ventricle demonstrates regional wall motion  ?abnormalities (see scoring diagram/findings for  ??description). There is moderate left ventricular hypertrophy. Left  ?ventricular diastolic parameters are consistent with Grade I diastolic  ?dysfunction (impaired relaxation).  ??2. Right ventricular systolic function is normal. The right ventricular  ?size is normal.  ??3. The mitral valve is normal in structure. No evidence of mitral valve  ?regurgitation. No evidence of mitral stenosis.  ??4. The aortic valve is tricuspid. Aortic valve regurgitation is mild. No  ?aortic stenosis is present.  ? Reproductive/Obstetrics ?negative OB ROS ? ?  ? ? ? ? ? ? ? ? ? ? ? ? ? ?  ?  ? ? ? ? ? ? ? ? ?Anesthesia Physical ?Anesthesia Plan ? ?ASA: 3 ? ?Anesthesia Plan: General  ? ?Post-op Pain Management:    ? ?Induction:  ? ?PONV Risk Score and Plan: Propofol infusion ? ?Airway Management Planned:  ? ?Additional Equipment:  ? ?Intra-op Plan:  ? ?Post-operative Plan:  ? ?Informed Consent: I have reviewed the patients History and Physical, chart, labs and discussed the procedure including the risks, benefits and alternatives for the proposed anesthesia with the patient or authorized representative who has indicated his/her understanding and acceptance.  ? ? ? ?Dental Advisory Given ? ?Plan Discussed with: CRNA ? ?Anesthesia Plan Comments:   ? ? ? ? ? ? ?Anesthesia Quick Evaluation ? ?

## 2021-05-11 NOTE — H&P (Signed)
Thomas Black. is an 81 y.o. male.   ?Chief Complaint: Patient is here for colonoscopy ?HPI: Patient is 81 year old Caucasian male who is here for screening colonoscopy.  Last exam was normal 10 years ago.  He denies abdominal pain change in bowel habits or rectal bleeding.  He has history of ischemic cardiomyopathy and has a permanent pacemaker.  He states he does not experience exertional dyspnea or chest pain. ?Family history is negative for colon cancer. ?Last Plavix dose was 5 days ago and aspirin was 3 days ago. ? ?Past Medical History:  ?Diagnosis Date  ? AICD (automatic cardioverter/defibrillator) present   ? Chronic neck pain   ? Coronary artery disease   ? a. 11/2017: s/p anterolateral STEMI with cath showing severe thrombotic occlusion of proximal-LAD --> PCI/DES performed  ? Essential hypertension   ? Headache(784.0)   ? Hypercholesteremia   ? ICD (implantable cardioverter-defibrillator) in place   ? [employer], September 2020 - Dr. Lovena Le  ? Ischemic cardiomyopathy   ? Migraine   ? Muscle spasms of neck   ? Myocardial infarction [facility]) 2019  ? ? ?Past Surgical History:  ?Procedure Laterality Date  ? CHOLECYSTECTOMY    ? 1/12  ? COLONOSCOPY  03/15/2011  ? Procedure: COLONOSCOPY;  Surgeon: Rogene Houston, MD;  [facility];  Service: Endoscopy;  Laterality: N/A;  9:30 am  ? CORONARY STENT INTERVENTION N/A 12/03/2017  ? Procedure: CORONARY STENT INTERVENTION;  Surgeon: Sherren Mocha, MD;  Location: [facility];  Service: Cardiovascular;  Laterality: N/A;  ? ESOPHAGOGASTRODUODENOSCOPY N/A 06/26/2012  ? Procedure: ESOPHAGOGASTRODUODENOSCOPY (EGD);  Surgeon: Rogene Houston, MD;  Location: [facility];  Service: Endoscopy;  Laterality: N/A;  ? fractured jaw    ? fractured nose    ? ICD IMPLANT N/A 11/27/2018  ? Procedure: ICD IMPLANT;  Surgeon: Evans Lance, MD;  Location: [facility];  Service: Cardiovascular;  Laterality: N/A;  ? left acl repair    ? LEFT HEART CATH AND  CORONARY ANGIOGRAPHY N/A 12/03/2017  ? Procedure: LEFT HEART CATH AND CORONARY ANGIOGRAPHY;  Surgeon: Sherren Mocha, MD;  Location: [facility];  Service: Cardiovascular;  Laterality: N/A;  ? Left Knee Arthroscopy    ? TIBIA FRACTURE SURGERY    ? ? ?Family History  ?Problem Relation Age of Onset  ? Cancer Mother   ? Colon cancer Neg Hx   ? Heart disease Neg Hx   ? ?Social History:  reports that he has never smoked. He has never used smokeless tobacco. He reports that he does not drink alcohol and does not use drugs. ? ?Allergies: No Known Allergies ? ?Medications Prior to Admission  ?Medication Sig Dispense Refill  ? acetaminophen (TYLENOL) 325 MG tablet Take 650 mg by mouth every 6 (six) hours as needed for mild pain.    ? aspirin 81 MG EC tablet Take 1 tablet (81 mg total) by mouth daily. 30 tablet 0  ? atorvastatin (LIPITOR) 80 MG tablet TAKE ONE TABLET (80MG  TOTAL) BY MOUTH DAILY AT 6PM 90 tablet 3  ? carvedilol (COREG) 6.25 MG tablet TAKE ONE TABLET (6.25MG  TOTAL) BY MOUTH TWO TIMES DAILY WITH A MEAL 180 tablet 3  ? clopidogrel (PLAVIX) 75 MG tablet TAKE ONE (1) TABLET BY MOUTH EVERY DAY 90 tablet 3  ? cyclobenzaprine (FLEXERIL) 10 MG tablet Take 10 mg by mouth daily as needed for muscle spasms.    ? ENTRESTO 24-26 MG TAKE ONE TABLET BY MOUTH  TWICE A DAY 60 tablet 11  ? omeprazole (PRILOSEC) 20 MG capsule Take 20 mg by mouth every evening.    ? ? ?Results for orders placed or performed during the hospital encounter of 05/09/21 (from the past 48 hour(s))  ?Basic metabolic panel     Status: Abnormal  ? Collection Time: 05/09/21 11:35 AM  ?Result Value Ref Range  ? Sodium 137 135 - 145 mmol/L  ? Potassium 4.5 3.5 - 5.1 mmol/L  ? Chloride 103 98 - 111 mmol/L  ? CO2 27 22 - 32 mmol/L  ? Glucose, Bld 120 (H) 70 - 99 mg/dL  ?  Comment: Glucose reference range applies only to samples taken after fasting for at least 8 hours.  ? BUN 16 8 - 23 mg/dL  ? Creatinine, Ser 0.96 0.61 - 1.24 mg/dL  ? Calcium 8.9 8.9 -  10.3 mg/dL  ? GFR, Estimated >60 >60 mL/min  ?  Comment: (NOTE) ?Calculated using the CKD-EPI Creatinine Equation (2021) ?  ? Anion gap 7 5 - 15  ?  Comment: Performed at [facility], [address], [place], [place] [address]  ? ?No results found. ? ?Review of Systems ? ?Blood pressure 140/77, pulse 60, temperature 97.8 ?F (36.6 ?C), temperature source Oral, resp. rate 17, height 6' (1.829 m), weight 93 kg, SpO2 97 %. ?Physical Exam ?HENT:  ?   Mouth/Throat:  ?   Mouth: Mucous membranes are moist.  ?   Pharynx: Oropharynx is clear.  ?Eyes:  ?   General: No scleral icterus. ?   Conjunctiva/sclera: Conjunctivae normal.  ?Cardiovascular:  ?   Rate and Rhythm: Normal rate and regular rhythm.  ?   Heart sounds: Normal heart sounds. No murmur heard. ?Pulmonary:  ?   Effort: Pulmonary effort is normal.  ?   Breath sounds: Normal breath sounds.  ?Abdominal:  ?   General: There is no distension.  ?   Palpations: Abdomen is soft. There is no mass.  ?   Tenderness: There is no abdominal tenderness.  ?Musculoskeletal:     ?   General: No swelling.  ?   Cervical back: Neck supple.  ?Lymphadenopathy:  ?   Cervical: No cervical adenopathy.  ?Skin: ?   General: Skin is warm and dry.  ?Neurological:  ?   Mental Status: He is alert.  ?  ? ?Assessment/Plan ? ?Average risk screening colonoscopy. ? ?Hildred Laser, MD ?05/11/2021, 8:31 AM ? ? ? ?

## 2021-05-11 NOTE — Op Note (Signed)
[facility] ?Patient Name: Thomas Black ?Procedure Date: 05/11/2021 8:13 AM ?MRN: [id number] ?Date of Birth: 08-18-1940 ?Attending MD: Lionel December , MD ?CSN: [id number] ?Age: 81 ?Admit Type: Outpatient ?Procedure:                Colonoscopy ?Indications:              Screening for colorectal malignant neoplasm ?Providers:                Lionel December, MD, Sheran Fava, Dyann Ruddle ?Referring MD:             [name], MD ?Medicines:                Propofol per Anesthesia ?Complications:            No immediate complications. ?Estimated Blood Loss:     Estimated blood loss: none. ?Procedure:                Pre-Anesthesia Assessment: ?                          - Prior to the procedure, a History and Physical  ?                          was performed, and patient medications and  ?                          allergies were reviewed. The patient's tolerance of  ?                          previous anesthesia was also reviewed. The risks  ?                          and benefits of the procedure and the sedation  ?                          options and risks were discussed with the patient.  ?                          All questions were answered, and informed consent  ?                          was obtained. Prior Anticoagulants: The patient  ?                          last took Plavix (clopidogrel) 5 days prior to the  ?                          procedure. ASA Grade Assessment: III - A patient  ?                          with severe systemic disease. After reviewing the  ?                          risks and benefits, the patient was deemed in  ?  satisfactory condition to undergo the procedure. ?                          After obtaining informed consent, the colonoscope  ?                          was passed under direct vision. Throughout the  ?                          procedure, the patient's blood pressure, pulse, and  ?                          oxygen saturations were monitored  continuously. The  ?                          PCF-HQ190L ([id number]) scope was introduced through  ?                          the anus and advanced to the the cecum, identified  ?                          by appendiceal orifice and ileocecal valve. The  ?                          colonoscopy was performed without difficulty. The  ?                          patient tolerated the procedure well. The quality  ?                          of the bowel preparation was good. The ileocecal  ?                          valve, appendiceal orifice, and rectum were  ?                          photographed. ?Scope In: 8:39:35 AM ?Scope Out: 8:54:05 AM ?Scope Withdrawal Time: 0 hours 6 minutes 34 seconds  ?Total Procedure Duration: 0 hours 14 minutes 30 seconds  ?Findings: ?     The perianal and digital rectal examinations were normal. ?     Scattered diverticula were found in the sigmoid colon. ?     The exam was otherwise normal throughout the examined colon. ?     External hemorrhoids were found during retroflexion. The hemorrhoids  ?     were small. ?     Anal papilla(e) were hypertrophied. ?Impression:               - Diverticulosis in the sigmoid colon. ?                          - External hemorrhoids. ?                          - Anal papilla(e) were hypertrophied. ?                          -  No specimens collected. ?Moderate Sedation: ?     Per Anesthesia Care ?Recommendation:           - Patient has a contact number available for  ?                          emergencies. The signs and symptoms of potential  ?                          delayed complications were discussed with the  ?                          patient. Return to normal activities tomorrow.  ?                          Written discharge instructions were provided to the  ?                          patient. ?                          - High fiber diet today. ?                          - Continue present medications. ?                          - No repeat  colonoscopy due to age and the absence  ?                          of colonic polyps. ?Procedure Code(s):        --- Professional --- ?                          [phone], Colonoscopy, flexible; diagnostic, including  ?                          collection of specimen(s) by brushing or washing,  ?                          when performed (separate procedure) ?Diagnosis Code(s):        --- Professional --- ?                          Z12.11, Encounter for screening for malignant  ?                          neoplasm of colon ?                          K64.4, Residual hemorrhoidal skin tags ?                          K62.89, Other specified diseases of anus and rectum ?                          K57.30, Diverticulosis of large intestine  without  ?                          perforation or abscess without bleeding ?CPT copyright 2019 American Medical Association. All rights reserved. ?The codes documented in this report are preliminary and upon coder review may  ?be revised to meet current compliance requirements. ?Lionel December, MD ?Lionel December, MD ?05/11/2021 9:00:06 AM ?This report has been signed electronically. ?Number of Addenda: 0 ?

## 2021-05-11 NOTE — Discharge Instructions (Signed)
Resume usual medications including aspirin and clopidogrel as before ?High-fiber diet ?No driving for 24 hours. ?No more screening colonoscopies. ?

## 2021-05-11 NOTE — Transfer of Care (Signed)
Immediate Anesthesia Transfer of Care Note ? ?Patient: Thomas Black. ? ?Procedure(s) Performed: COLONOSCOPY WITH PROPOFOL ? ?Patient Location: Short Stay ? ?Anesthesia Type:General ? ?Level of Consciousness: awake ? ?Airway & Oxygen Therapy: Patient Spontanous Breathing ? ?Post-op Assessment: Report given to RN ? ?Post vital signs: Reviewed ? ?Last Vitals:  ?Vitals Value Taken Time  ?BP    ?Temp    ?Pulse    ?Resp    ?SpO2    ? ? ?Last Pain:  ?Vitals:  ? 05/11/21 0835  ?TempSrc:   ?PainSc: 0-No pain  ?   ? ?Patients Stated Pain Goal: (P) 5 (05/11/21 0727) ? ?Complications: No notable events documented. ?

## 2021-05-11 NOTE — Anesthesia Postprocedure Evaluation (Signed)
Anesthesia Post Note ? ?Patient: Thomas Black. ? ?Procedure(s) Performed: COLONOSCOPY WITH PROPOFOL ? ?Patient location during evaluation: Short Stay ?Anesthesia Type: General ?Level of consciousness: awake and alert ?Pain management: pain level controlled ?Vital Signs Assessment: post-procedure vital signs reviewed and stable ?Respiratory status: spontaneous breathing ?Cardiovascular status: blood pressure returned to baseline and stable ?Postop Assessment: no apparent nausea or vomiting ?Anesthetic complications: no ? ? ?No notable events documented. ? ? ?Last Vitals:  ?Vitals:  ? 05/11/21 0727  ?BP: 140/77  ?Pulse: 60  ?Resp: 17  ?Temp: 36.6 ?C  ?SpO2: 97%  ?  ?Last Pain:  ?Vitals:  ? 05/11/21 0835  ?TempSrc:   ?PainSc: 0-No pain  ? ? ?  ?  ?  ?  ?  ?  ? ?Rayvion Stumph ? ? ? ? ?

## 2021-05-12 ENCOUNTER — Encounter (INDEPENDENT_AMBULATORY_CARE_PROVIDER_SITE_OTHER): Payer: Self-pay | Admitting: *Deleted

## 2021-05-12 NOTE — Addendum Note (Signed)
Addendum  created 05/12/21 0908 by Franco Nones, CRNA  ? Intraprocedure Staff edited  ?  ?

## 2021-05-16 ENCOUNTER — Encounter (HOSPITAL_COMMUNITY): Payer: Self-pay | Admitting: Internal Medicine

## 2021-05-26 DIAGNOSIS — D1801 Hemangioma of skin and subcutaneous tissue: Secondary | ICD-10-CM | POA: Diagnosis not present

## 2021-05-26 DIAGNOSIS — L814 Other melanin hyperpigmentation: Secondary | ICD-10-CM | POA: Diagnosis not present

## 2021-05-26 DIAGNOSIS — D225 Melanocytic nevi of trunk: Secondary | ICD-10-CM | POA: Diagnosis not present

## 2021-05-26 DIAGNOSIS — L918 Other hypertrophic disorders of the skin: Secondary | ICD-10-CM | POA: Diagnosis not present

## 2021-05-26 DIAGNOSIS — L821 Other seborrheic keratosis: Secondary | ICD-10-CM | POA: Diagnosis not present

## 2021-05-26 DIAGNOSIS — D0421 Carcinoma in situ of skin of right ear and external auricular canal: Secondary | ICD-10-CM | POA: Diagnosis not present

## 2021-05-26 DIAGNOSIS — L570 Actinic keratosis: Secondary | ICD-10-CM | POA: Diagnosis not present

## 2021-05-26 DIAGNOSIS — Z85828 Personal history of other malignant neoplasm of skin: Secondary | ICD-10-CM | POA: Diagnosis not present

## 2021-05-26 DIAGNOSIS — D485 Neoplasm of uncertain behavior of skin: Secondary | ICD-10-CM | POA: Diagnosis not present

## 2021-06-03 ENCOUNTER — Ambulatory Visit (INDEPENDENT_AMBULATORY_CARE_PROVIDER_SITE_OTHER): Payer: Medicare PPO

## 2021-06-03 DIAGNOSIS — I255 Ischemic cardiomyopathy: Secondary | ICD-10-CM

## 2021-06-03 DIAGNOSIS — I5022 Chronic systolic (congestive) heart failure: Secondary | ICD-10-CM

## 2021-06-06 LAB — CUP PACEART REMOTE DEVICE CHECK
Battery Remaining Longevity: 100 mo
Battery Voltage: 3 V
Brady Statistic AP VP Percent: 0.03 %
Brady Statistic AP VS Percent: 36.52 %
Brady Statistic AS VP Percent: 0.03 %
Brady Statistic AS VS Percent: 63.42 %
Brady Statistic RA Percent Paced: 36.35 %
Brady Statistic RV Percent Paced: 0.06 %
Date Time Interrogation Session: 20230407093627
HighPow Impedance: 69 Ohm
Implantable Lead Implant Date: 20200930
Implantable Lead Implant Date: 20200930
Implantable Lead Location: 753859
Implantable Lead Location: 753860
Implantable Lead Model: 5076
Implantable Lead Model: 6935
Implantable Pulse Generator Implant Date: 20200930
Lead Channel Impedance Value: 304 Ohm
Lead Channel Impedance Value: 342 Ohm
Lead Channel Impedance Value: 456 Ohm
Lead Channel Pacing Threshold Amplitude: 0.625 V
Lead Channel Pacing Threshold Amplitude: 0.75 V
Lead Channel Pacing Threshold Pulse Width: 0.4 ms
Lead Channel Pacing Threshold Pulse Width: 0.4 ms
Lead Channel Sensing Intrinsic Amplitude: 15.375 mV
Lead Channel Sensing Intrinsic Amplitude: 15.375 mV
Lead Channel Sensing Intrinsic Amplitude: 2.25 mV
Lead Channel Sensing Intrinsic Amplitude: 2.25 mV
Lead Channel Setting Pacing Amplitude: 2 V
Lead Channel Setting Pacing Amplitude: 2.5 V
Lead Channel Setting Pacing Pulse Width: 0.4 ms
Lead Channel Setting Sensing Sensitivity: 0.3 mV

## 2021-06-21 NOTE — Progress Notes (Signed)
Remote ICD transmission.   

## 2021-07-18 ENCOUNTER — Ambulatory Visit: Payer: Medicare PPO | Admitting: Cardiology

## 2021-07-18 ENCOUNTER — Encounter: Payer: Self-pay | Admitting: Cardiology

## 2021-07-18 VITALS — BP 122/70 | HR 71 | Ht 72.0 in | Wt 210.2 lb

## 2021-07-18 DIAGNOSIS — I25119 Atherosclerotic heart disease of native coronary artery with unspecified angina pectoris: Secondary | ICD-10-CM | POA: Diagnosis not present

## 2021-07-18 DIAGNOSIS — I255 Ischemic cardiomyopathy: Secondary | ICD-10-CM

## 2021-07-18 DIAGNOSIS — I5020 Unspecified systolic (congestive) heart failure: Secondary | ICD-10-CM

## 2021-07-18 NOTE — Patient Instructions (Signed)
Medication Instructions:  Your physician recommends that you continue on your current medications as directed. Please refer to the Current Medication list given to you today.   Labwork: None today  Testing/Procedures: Your physician has requested that you have an echocardiogram. Echocardiography is a painless test that uses sound waves to create images of your heart. It provides your doctor with information about the size and shape of your heart and how well your heart's chambers and valves are working. This procedure takes approximately one hour. There are no restrictions for this procedure.   Follow-Up: 6 months  Any Other Special Instructions Will Be Listed Below (If Applicable).  If you need a refill on your cardiac medications before your next appointment, please call your pharmacy.  

## 2021-07-18 NOTE — Progress Notes (Signed)
Cardiology Office Note  Date: 07/18/2021   ID: Thomas Orth., DOB [birth year], MRN [id number]  PCP:  [name], MD  Cardiologist:  [name], MD Electrophysiologist:  [name], MD   Chief Complaint  Patient presents with   Cardiac follow-up    History of Present Illness: Thomas Buist. is a 81 y.o. male last seen in November 2022 by Ms. Geni Bers PA-C.  He is here for a follow-up visit.  States that he is doing well overall, walks 2 miles, 3 days a week with NYHA class II dyspnea, no angina symptoms.  He stays as active as he can.  Less stamina over the years, but no precipitous change since last visit.  Medtronic ICD in place with followed by Dr. [name].  Last device check showed normal function with 1 episode of NSVT.  He does not report any device shocks or syncope.  His last echocardiogram was in November 2021 at which point LVEF was 30 to 35% range.  We discussed getting an updated study.  He was taken off Aldactone last year given orthostatic hypotension.  Otherwise on Coreg and Entresto.  No standing diuretic.  Past Medical History:  Diagnosis Date   AICD (automatic cardioverter/defibrillator) present    Chronic neck pain    Coronary artery disease    a. 11/2017: s/p anterolateral STEMI with cath showing severe thrombotic occlusion of proximal-LAD --> PCI/DES performed   Essential hypertension    Headache(784.0)    Hypercholesteremia    ICD (implantable cardioverter-defibrillator) in place    Medtronic, September 2020 - Dr. [name]   Ischemic cardiomyopathy    Migraine    Muscle spasms of neck    Myocardial infarction [facility]) 2019    Past Surgical History:  Procedure Laterality Date   CHOLECYSTECTOMY     1/12   COLONOSCOPY  03/15/2011   Procedure: COLONOSCOPY;  Surgeon: [name], MD;  [facility];  Service: Endoscopy;  Laterality: N/A;  9:30 am   COLONOSCOPY WITH PROPOFOL N/A 05/11/2021   Procedure: COLONOSCOPY WITH PROPOFOL;   Surgeon: [name], MD;  [facility];  Service: Endoscopy;  Laterality: N/A;  820   CORONARY STENT INTERVENTION N/A 12/03/2017   Procedure: CORONARY STENT INTERVENTION;  Surgeon: [name], MD;  Location: [facility] INVASIVE CV LAB;  Service: Cardiovascular;  Laterality: N/A;   ESOPHAGOGASTRODUODENOSCOPY N/A 06/26/2012   Procedure: ESOPHAGOGASTRODUODENOSCOPY (EGD);  Surgeon: [name], MD;  [facility];  Service: Endoscopy;  Laterality: N/A;   fractured jaw     fractured nose     ICD IMPLANT N/A 11/27/2018   Procedure: ICD IMPLANT;  Surgeon: [name], MD;  Location: [facility] INVASIVE CV LAB;  Service: Cardiovascular;  Laterality: N/A;   left acl repair     LEFT HEART CATH AND CORONARY ANGIOGRAPHY N/A 12/03/2017   Procedure: LEFT HEART CATH AND CORONARY ANGIOGRAPHY;  Surgeon: [name], MD;  Location: [facility] INVASIVE CV LAB;  Service: Cardiovascular;  Laterality: N/A;   Left Knee Arthroscopy     TIBIA FRACTURE SURGERY      Current Outpatient Medications  Medication Sig Dispense Refill   acetaminophen (TYLENOL) 325 MG tablet Take 650 mg by mouth every 6 (six) hours as needed for mild pain.     aspirin 81 MG EC tablet Take 1 tablet (81 mg total) by mouth daily. 30 tablet 0   atorvastatin (LIPITOR) 80 MG tablet TAKE ONE TABLET (80MG  TOTAL) BY MOUTH DAILY  AT 6PM 90 tablet 3   carvedilol (COREG) 6.25 MG tablet TAKE ONE TABLET (6.25MG  TOTAL) BY MOUTH TWO TIMES DAILY WITH A MEAL 180 tablet 3   clopidogrel (PLAVIX) 75 MG tablet TAKE ONE (1) TABLET BY MOUTH EVERY DAY 90 tablet 3   cyclobenzaprine (FLEXERIL) 10 MG tablet Take 10 mg by mouth daily as needed for muscle spasms.     ENTRESTO 24-26 MG TAKE ONE TABLET BY MOUTH TWICE A DAY 60 tablet 11   omeprazole (PRILOSEC) 20 MG capsule Take 20 mg by mouth every evening.     No current facility-administered medications for this visit.   Allergies:  Patient has no known allergies.   ROS: No orthopnea or PND.  No leg  swelling.  Physical Exam: VS:  BP 122/70   Pulse 71   Ht 6' (1.829 m)   Wt 210 lb 3.2 oz (95.3 kg)   SpO2 93%   BMI 28.51 kg/m , BMI Body mass index is 28.51 kg/m.  Wt Readings from Last 3 Encounters:  07/18/21 210 lb 3.2 oz (95.3 kg)  05/11/21 205 lb (93 kg)  03/15/21 214 lb 4.8 oz (97.2 kg)    General: Patient appears comfortable at rest. HEENT: Conjunctiva and lids normal. Neck: Supple, no elevated JVP or carotid bruits. Lungs: Clear to auscultation, nonlabored breathing at rest. Cardiac: Regular rate and rhythm, no S3 or significant systolic murmur, no pericardial rub. Extremities: No pitting edema.  ECG:  An ECG dated 02/08/2021 was personally reviewed today and demonstrated:  Atrial pacing with left anterior fascicular block and poor R wave progression rule out old anterior infarct pattern.  Recent Labwork: 10/04/2020: Hemoglobin 12.5; Platelets 237 05/09/2021: BUN 16; Creatinine, Ser 0.96; Potassium 4.5; Sodium 137     Component Value Date/Time   CHOL 200 12/04/2017 0437   TRIG 163 (H) 12/04/2017 0437   HDL 60 12/04/2017 0437   CHOLHDL 3.3 12/04/2017 0437   VLDL 33 12/04/2017 0437   LDLCALC 107 (H) 12/04/2017 0437    Other Studies Reviewed Today:  Echocardiogram 12/29/2019:  1. The entire apex is akinetic. Marland Kitchen Left ventricular ejection fraction, by  estimation, is 30 to 35%. The left ventricle has moderately decreased  function. The left ventricle demonstrates regional wall motion  abnormalities (see scoring diagram/findings for   description). There is moderate left ventricular hypertrophy. Left  ventricular diastolic parameters are consistent with Grade I diastolic  dysfunction (impaired relaxation).   2. Right ventricular systolic function is normal. The right ventricular  size is normal.   3. The mitral valve is normal in structure. No evidence of mitral valve  regurgitation. No evidence of mitral stenosis.   4. The aortic valve is tricuspid. Aortic valve  regurgitation is mild. No  aortic stenosis is present.   Assessment and Plan:  1.  HFrEF with ischemic cardiomyopathy and LVEF 30 to 35% as of November 2021.  Clinically stable with NYHA class II dyspnea.  Continues on Coreg and Entresto.  Had to come off Aldactone due to orthostasis.  We will plan a follow-up echocardiogram for surveillance.  Could potentially consider addition of SGLT2 inhibitor, although he has been quite stable.  2.  CAD status post prior anterolateral STEMI in October 2019 status with DES to the proximal LAD.  No active angina.  Continue aspirin, Plavix, and Lipitor.  Medication Adjustments/Labs and Tests Ordered: Current medicines are reviewed at length with the patient today.  Concerns regarding medicines are outlined above.   Tests Ordered: Orders Placed  This Encounter  Procedures   ECHOCARDIOGRAM COMPLETE    Medication Changes: No orders of the defined types were placed in this encounter.   Disposition:  Follow up  6 months.  Signed, Jonelle Sidle, MD, [facility] 07/18/2021 2:32 PM     [facility] [address] [place], [place] [address] Phone: [phone]; Fax: [phone]

## 2021-08-02 ENCOUNTER — Ambulatory Visit (HOSPITAL_COMMUNITY)
Admission: RE | Admit: 2021-08-02 | Discharge: 2021-08-02 | Disposition: A | Discharge status: Home / Self Care | Payer: Medicare PPO | Source: Ambulatory Visit | Attending: Cardiology | Admitting: Cardiology

## 2021-08-02 ENCOUNTER — Telehealth: Payer: Self-pay

## 2021-08-02 DIAGNOSIS — I5020 Unspecified systolic (congestive) heart failure: Secondary | ICD-10-CM | POA: Diagnosis not present

## 2021-08-02 LAB — ECHOCARDIOGRAM COMPLETE
AR max vel: 2.35 cm2
AV Area VTI: 2.34 cm2
AV Area mean vel: 2.11 cm2
AV Mean grad: 3 mmHg
AV Peak grad: 4.8 mmHg
Ao pk vel: 1.09 m/s
Area-P 1/2: 3.36 cm2
Calc EF: 44.1 %
MV VTI: 2.02 cm2
S' Lateral: 4.3 cm
Single Plane A2C EF: 44.4 %
Single Plane A4C EF: 42.6 %

## 2021-08-02 NOTE — Progress Notes (Signed)
*  PRELIMINARY RESULTS* Echocardiogram 2D Echocardiogram has been performed.  Thomas Black 08/02/2021, 1:48 PM

## 2021-08-02 NOTE — Telephone Encounter (Signed)
-----   Message from Satira Sark, MD sent at 08/02/2021  4:06 PM EDT ----- Results reviewed.  LVEF similar to prior assessment, 30 to 35% range and consistent with ischemic cardiomyopathy.  He has done very well symptomatically speaking as per most recent office note.  To round out regimen and further reduce cardiac risk, we could add SGLT2 inhibitor such as [place] or Jardiance.  He did not tolerate Aldactone previously due to orthostasis.  Please check with him about possible addition of therapy and we can see which is better covered for him.

## 2021-08-02 NOTE — Telephone Encounter (Signed)
Patient agrees to begin medication. I will FYI Dr.McDowell.

## 2021-08-11 NOTE — Telephone Encounter (Signed)
Patient called back to say the new prescription hasnt been put in. Please advise

## 2021-09-05 DIAGNOSIS — I2510 Atherosclerotic heart disease of native coronary artery without angina pectoris: Secondary | ICD-10-CM | POA: Diagnosis not present

## 2021-09-05 DIAGNOSIS — I5020 Unspecified systolic (congestive) heart failure: Secondary | ICD-10-CM | POA: Diagnosis not present

## 2021-10-03 ENCOUNTER — Other Ambulatory Visit: Payer: Self-pay | Admitting: Cardiology

## 2021-10-04 ENCOUNTER — Ambulatory Visit (INDEPENDENT_AMBULATORY_CARE_PROVIDER_SITE_OTHER): Payer: Medicare PPO

## 2021-10-04 DIAGNOSIS — I255 Ischemic cardiomyopathy: Secondary | ICD-10-CM | POA: Diagnosis not present

## 2021-10-04 LAB — CUP PACEART REMOTE DEVICE CHECK
Battery Remaining Longevity: 97 mo
Battery Voltage: 3 V
Brady Statistic AP VP Percent: 0.06 %
Brady Statistic AP VS Percent: 43.9 %
Brady Statistic AS VP Percent: 0.04 %
Brady Statistic AS VS Percent: 56 %
Brady Statistic RA Percent Paced: 43.73 %
Brady Statistic RV Percent Paced: 0.1 %
Date Time Interrogation Session: 20230806172326
HighPow Impedance: 68 Ohm
Implantable Lead Implant Date: 20200930
Implantable Lead Implant Date: 20200930
Implantable Lead Location: 753859
Implantable Lead Location: 753860
Implantable Lead Model: 5076
Implantable Lead Model: 6935
Implantable Pulse Generator Implant Date: 20200930
Lead Channel Impedance Value: 304 Ohm
Lead Channel Impedance Value: 361 Ohm
Lead Channel Impedance Value: 513 Ohm
Lead Channel Pacing Threshold Amplitude: 0.625 V
Lead Channel Pacing Threshold Amplitude: 0.875 V
Lead Channel Pacing Threshold Pulse Width: 0.4 ms
Lead Channel Pacing Threshold Pulse Width: 0.4 ms
Lead Channel Sensing Intrinsic Amplitude: 14.375 mV
Lead Channel Sensing Intrinsic Amplitude: 14.375 mV
Lead Channel Sensing Intrinsic Amplitude: 2.125 mV
Lead Channel Sensing Intrinsic Amplitude: 2.125 mV
Lead Channel Setting Pacing Amplitude: 2 V
Lead Channel Setting Pacing Amplitude: 2.5 V
Lead Channel Setting Pacing Pulse Width: 0.4 ms
Lead Channel Setting Sensing Sensitivity: 0.3 mV

## 2021-11-08 NOTE — Progress Notes (Signed)
Remote ICD transmission.   

## 2021-12-01 DIAGNOSIS — Z23 Encounter for immunization: Secondary | ICD-10-CM | POA: Diagnosis not present

## 2022-01-02 ENCOUNTER — Other Ambulatory Visit: Payer: Self-pay | Admitting: Cardiology

## 2022-01-03 ENCOUNTER — Ambulatory Visit (INDEPENDENT_AMBULATORY_CARE_PROVIDER_SITE_OTHER): Payer: Medicare PPO

## 2022-01-03 DIAGNOSIS — I255 Ischemic cardiomyopathy: Secondary | ICD-10-CM

## 2022-01-03 DIAGNOSIS — Z95810 Presence of automatic (implantable) cardiac defibrillator: Secondary | ICD-10-CM

## 2022-01-03 LAB — CUP PACEART REMOTE DEVICE CHECK
Battery Remaining Longevity: 93 mo
Battery Voltage: 3 V
Brady Statistic AP VP Percent: 0.08 %
Brady Statistic AP VS Percent: 50.13 %
Brady Statistic AS VP Percent: 0.03 %
Brady Statistic AS VS Percent: 49.76 %
Brady Statistic RA Percent Paced: 50.09 %
Brady Statistic RV Percent Paced: 0.12 %
Date Time Interrogation Session: 20231107063523
HighPow Impedance: 73 Ohm
Implantable Lead Connection Status: 753985
Implantable Lead Connection Status: 753985
Implantable Lead Implant Date: 20200930
Implantable Lead Implant Date: 20200930
Implantable Lead Location: 753859
Implantable Lead Location: 753860
Implantable Lead Model: 5076
Implantable Lead Model: 6935
Implantable Pulse Generator Implant Date: 20200930
Lead Channel Impedance Value: 304 Ohm
Lead Channel Impedance Value: 342 Ohm
Lead Channel Impedance Value: 475 Ohm
Lead Channel Pacing Threshold Amplitude: 0.5 V
Lead Channel Pacing Threshold Amplitude: 0.875 V
Lead Channel Pacing Threshold Pulse Width: 0.4 ms
Lead Channel Pacing Threshold Pulse Width: 0.4 ms
Lead Channel Sensing Intrinsic Amplitude: 15.75 mV
Lead Channel Sensing Intrinsic Amplitude: 15.75 mV
Lead Channel Sensing Intrinsic Amplitude: 2.375 mV
Lead Channel Sensing Intrinsic Amplitude: 2.375 mV
Lead Channel Setting Pacing Amplitude: 2 V
Lead Channel Setting Pacing Amplitude: 2.5 V
Lead Channel Setting Pacing Pulse Width: 0.4 ms
Lead Channel Setting Sensing Sensitivity: 0.3 mV
Zone Setting Status: 755011
Zone Setting Status: 755011

## 2022-01-09 ENCOUNTER — Other Ambulatory Visit: Payer: Self-pay | Admitting: Cardiology

## 2022-01-25 NOTE — Progress Notes (Unsigned)
Cardiology Office Note  Date: 01/26/2022   ID: Thomas Black., DOB Nov 28, 1940, MRN [id number]  PCP:  Thomas Perches, MD  Cardiologist:  Thomas Dell, MD Electrophysiologist:  Thomas Bunting, MD   Chief Complaint  Patient presents with   Cardiac follow-up    History of Present Illness: Thomas Cuffe. is a 81 y.o. male last seen in May.  He is here for a routine visit.  Reports no angina, no palpitations or syncope.  Generally NYHA class II dyspnea at baseline, class III with more intense activity such as walking uphill or carrying things.  Medtronic ICD in place with followed by Dr. Ladona Black.  Recent device interrogation indicated normal function.  He has had no device discharges.  We went over his medications.  We do plan to initiate SGLT2 inhibitor, discussed this today.  He had an echocardiogram in June with relatively stable LV dysfunction in the range of 30 to 35%, wall motion abnormalities consistent with known ischemic cardiomyopathy.  He did not tolerate Aldactone previously.  Otherwise not on standing diuretic.  I personally reviewed his ECG today which shows an atrial paced rhythm with old anterior infarct pattern.  Past Medical History:  Diagnosis Date   AICD (automatic cardioverter/defibrillator) present    Chronic neck pain    Coronary artery disease    a. 11/2017: s/p anterolateral STEMI with cath showing severe thrombotic occlusion of proximal-LAD --> PCI/DES performed   Essential hypertension    Headache(784.0)    Hypercholesteremia    ICD (implantable cardioverter-defibrillator) in place    Medtronic, September 2020 - Dr. Ladona Black   Ischemic cardiomyopathy    Migraine    Muscle spasms of neck    Myocardial infarction [facility]) 2019    Past Surgical History:  Procedure Laterality Date   CHOLECYSTECTOMY     1/12   COLONOSCOPY  03/15/2011   Procedure: COLONOSCOPY;  Surgeon: Thomas Hippo, MD;  [facility];  Service: Endoscopy;  Laterality: N/A;   9:30 am   COLONOSCOPY WITH PROPOFOL N/A 05/11/2021   Procedure: COLONOSCOPY WITH PROPOFOL;  Surgeon: Thomas Hippo, MD;  [facility];  Service: Endoscopy;  Laterality: N/A;  820   CORONARY STENT INTERVENTION N/A 12/03/2017   Procedure: CORONARY STENT INTERVENTION;  Surgeon: Thomas Bollman, MD;  Location: [facility] INVASIVE CV LAB;  Service: Cardiovascular;  Laterality: N/A;   ESOPHAGOGASTRODUODENOSCOPY N/A 06/26/2012   Procedure: ESOPHAGOGASTRODUODENOSCOPY (EGD);  Surgeon: Thomas Hippo, MD;  [facility];  Service: Endoscopy;  Laterality: N/A;   fractured jaw     fractured nose     ICD IMPLANT N/A 11/27/2018   Procedure: ICD IMPLANT;  Surgeon: Thomas Maw, MD;  Location: [facility] INVASIVE CV LAB;  Service: Cardiovascular;  Laterality: N/A;   left acl repair     LEFT HEART CATH AND CORONARY ANGIOGRAPHY N/A 12/03/2017   Procedure: LEFT HEART CATH AND CORONARY ANGIOGRAPHY;  Surgeon: Thomas Bollman, MD;  Location: [facility] INVASIVE CV LAB;  Service: Cardiovascular;  Laterality: N/A;   Left Knee Arthroscopy     TIBIA FRACTURE SURGERY      Current Outpatient Medications  Medication Sig Dispense Refill   acetaminophen (TYLENOL) 325 MG tablet Take 650 mg by mouth every 6 (six) hours as needed for mild pain.     aspirin 81 MG EC tablet Take 1 tablet (81 mg total) by mouth daily. 30 tablet 0   atorvastatin (LIPITOR) 80 MG tablet TAKE ONE TABLET (80MG  TOTAL) BY MOUTH  DAILY AT 6PM 90 tablet 3   carvedilol (COREG) 6.25 MG tablet TAKE ONE TABLET (6.25MG  TOTAL) BY MOUTH TWO TIMES DAILY WITH A MEAL 180 tablet 3   clopidogrel (PLAVIX) 75 MG tablet TAKE ONE (1) TABLET BY MOUTH EVERY DAY 90 tablet 3   cyclobenzaprine (FLEXERIL) 10 MG tablet Take 10 mg by mouth daily as needed for muscle spasms.     ENTRESTO 24-26 MG TAKE ONE TABLET BY MOUTH TWICE A DAY 60 tablet 11   omeprazole (PRILOSEC) 20 MG capsule Take 20 mg by mouth every evening.     No current facility-administered medications for this  visit.   Allergies:  Patient has no known allergies.   ROS: No orthopnea or PND.  Physical Exam: VS:  BP 126/70   Pulse 60   Ht 6' (1.829 m)   Wt 211 lb 9.6 oz (96 kg)   BMI 28.70 kg/m , BMI Body mass index is 28.7 kg/m.  Wt Readings from Last 3 Encounters:  01/26/22 211 lb 9.6 oz (96 kg)  07/18/21 210 lb 3.2 oz (95.3 kg)  05/11/21 205 lb (93 kg)    General: Patient appears comfortable at rest. HEENT: Conjunctiva and lids normal. Neck: Supple, no elevated JVP or carotid bruits. Lungs: Clear to auscultation, nonlabored breathing at rest. Cardiac: Regular rate and rhythm, no S3 or significant systolic murmur. Extremities: No pitting edema.  ECG:  An ECG dated 02/08/2021 was personally reviewed today and demonstrated:  Atrial pacing with left anterior fascicular block and poor R wave progression rule out old anterior infarct pattern.  Recent Labwork: December 2022: Hemoglobin 13.4, platelets 279, cholesterol 129, triglycerides 188, HDL 44, LDL 54 05/09/2021: BUN 16; Creatinine, Ser 0.96; Potassium 4.5; Sodium 137   Other Studies Reviewed Today:  Echocardiogram 08/02/2021:  1. Septal,apical akinesis inferior hypokinesis EF and RWMA;s similar to  TTE done 12/29/19 . Left ventricular ejection fraction, by estimation, is  30 to 35%. The left ventricle has moderately decreased function. The left  ventricle demonstrates regional  wall motion abnormalities (see scoring diagram/findings for description).  There is mild left ventricular hypertrophy of the basal and septal  segments. Left ventricular diastolic parameters were normal.   2. AICD leads in RA/RV. Right ventricular systolic function is normal.  The right ventricular size is normal.   3. Left atrial size was mildly dilated.   4. The mitral valve is normal in structure. No evidence of mitral valve  regurgitation. No evidence of mitral stenosis.   5. The aortic valve is tricuspid. There is mild calcification of the  aortic  valve. Aortic valve regurgitation is not visualized. Aortic valve  sclerosis is present, with no evidence of aortic valve stenosis.   6. Aortic dilatation noted. There is mild dilatation of the ascending  aorta, measuring 38 mm.   7. The inferior vena cava is normal in size with greater than 50%  respiratory variability, suggesting right atrial pressure of 3 mmHg.   Assessment and Plan:  1.  HFrEF with ischemic cardiomyopathy and LVEF 30 to 35%.  Reports NYHA class II-III dyspnea, no angina, no palpitations or device shocks.  He has a Medtronic ICD in place.  Plan to continue Coreg and Entresto.  Did not tolerate Aldactone due to orthostasis previously.  We will attempt addition of SGLT2 inhibitor (checking on cost of Jardiance versus [place]).  Keep pending follow-up with lab work per PCP in the near future.  2.  CAD status post previous anterolateral STEMI in October 2019  status post DES to the proximal LAD.  He does not describe any active angina at this time.  Continue antiplatelet regimen and Lipitor.  He will have follow-up lipids in the near future, last LDL was 54.  Medication Adjustments/Labs and Tests Ordered: Current medicines are reviewed at length with the patient today.  Concerns regarding medicines are outlined above.   Tests Ordered: Orders Placed This Encounter  Procedures   EKG 12-Lead    Medication Changes: No orders of the defined types were placed in this encounter.   Disposition:  Follow up  6 months.  Signed, Jonelle Sidle, MD, [facility] 01/26/2022 10:27 AM    [facility] [address], [place], [place] [address] Phone: [phone]; Fax: [phone]

## 2022-01-26 ENCOUNTER — Encounter: Payer: Self-pay | Admitting: Cardiology

## 2022-01-26 ENCOUNTER — Ambulatory Visit: Payer: Medicare PPO | Attending: Cardiology | Admitting: Cardiology

## 2022-01-26 VITALS — BP 126/70 | HR 60 | Ht 72.0 in | Wt 211.6 lb

## 2022-01-26 DIAGNOSIS — I5020 Unspecified systolic (congestive) heart failure: Secondary | ICD-10-CM | POA: Diagnosis not present

## 2022-01-26 DIAGNOSIS — I25119 Atherosclerotic heart disease of native coronary artery with unspecified angina pectoris: Secondary | ICD-10-CM | POA: Diagnosis not present

## 2022-01-26 MED ORDER — EMPAGLIFLOZIN 10 MG PO TABS: 10.0000 mg | ORAL_TABLET | Freq: Every day | ORAL | 3 refills | Status: DC

## 2022-01-26 NOTE — Addendum Note (Signed)
Addended by: Marlyn Corporal A on: 01/26/2022 10:39 AM   Modules accepted: Orders

## 2022-01-26 NOTE — Patient Instructions (Addendum)
Medication Instructions:  Your physician recommends that you continue on your current medications as directed. Please refer to the Current Medication list given to you today.   Checking with our pharmacist to see which medication your insurance covers (either [place] or [place]) , I will call you with that information.    Supple, Emeline Darling, RPH-CPP  Nori Riis, RN [place] Pepper is cheaper (Tier 2) - $40/1 month or $80/3 months so would send in as a 3 month supply for cost savings.  Thanks, Megan  Labwork: None today  Testing/Procedures: None today  Follow-Up: 6 months  Any Other Special Instructions Will Be Listed Below (If Applicable).  If you need a refill on your cardiac medications before your next appointment, please call your pharmacy.

## 2022-01-30 NOTE — Progress Notes (Signed)
Remote ICD transmission.   

## 2022-02-06 DIAGNOSIS — E785 Hyperlipidemia, unspecified: Secondary | ICD-10-CM | POA: Diagnosis not present

## 2022-02-06 DIAGNOSIS — G43009 Migraine without aura, not intractable, without status migrainosus: Secondary | ICD-10-CM | POA: Diagnosis not present

## 2022-02-06 DIAGNOSIS — Z79899 Other long term (current) drug therapy: Secondary | ICD-10-CM | POA: Diagnosis not present

## 2022-02-06 DIAGNOSIS — K219 Gastro-esophageal reflux disease without esophagitis: Secondary | ICD-10-CM | POA: Diagnosis not present

## 2022-02-06 DIAGNOSIS — I2510 Atherosclerotic heart disease of native coronary artery without angina pectoris: Secondary | ICD-10-CM | POA: Diagnosis not present

## 2022-02-06 DIAGNOSIS — I255 Ischemic cardiomyopathy: Secondary | ICD-10-CM | POA: Diagnosis not present

## 2022-02-13 DIAGNOSIS — K219 Gastro-esophageal reflux disease without esophagitis: Secondary | ICD-10-CM | POA: Diagnosis not present

## 2022-02-13 DIAGNOSIS — I5020 Unspecified systolic (congestive) heart failure: Secondary | ICD-10-CM | POA: Diagnosis not present

## 2022-02-13 DIAGNOSIS — E785 Hyperlipidemia, unspecified: Secondary | ICD-10-CM | POA: Diagnosis not present

## 2022-02-13 DIAGNOSIS — I2510 Atherosclerotic heart disease of native coronary artery without angina pectoris: Secondary | ICD-10-CM | POA: Diagnosis not present

## 2022-02-14 ENCOUNTER — Encounter: Payer: Self-pay | Admitting: Internal Medicine

## 2022-03-15 ENCOUNTER — Ambulatory Visit: Payer: Medicare PPO | Attending: Internal Medicine | Admitting: Internal Medicine

## 2022-03-15 ENCOUNTER — Other Ambulatory Visit: Payer: Self-pay | Admitting: Cardiology

## 2022-03-15 ENCOUNTER — Encounter: Payer: Self-pay | Admitting: Internal Medicine

## 2022-03-15 VITALS — BP 124/68 | HR 60 | Ht 72.0 in | Wt 207.0 lb

## 2022-03-15 DIAGNOSIS — I5022 Chronic systolic (congestive) heart failure: Secondary | ICD-10-CM

## 2022-03-15 LAB — CUP PACEART INCLINIC DEVICE CHECK
Battery Remaining Longevity: 90 mo
Battery Voltage: 3 V
Brady Statistic AP VP Percent: 0.08 %
Brady Statistic AP VS Percent: 45.43 %
Brady Statistic AS VP Percent: 0.04 %
Brady Statistic AS VS Percent: 54.46 %
Brady Statistic RA Percent Paced: 45.32 %
Brady Statistic RV Percent Paced: 0.12 %
Date Time Interrogation Session: 20240117165025
HighPow Impedance: 76 Ohm
Implantable Lead Connection Status: 753985
Implantable Lead Connection Status: 753985
Implantable Lead Implant Date: 20200930
Implantable Lead Implant Date: 20200930
Implantable Lead Location: 753859
Implantable Lead Location: 753860
Implantable Lead Model: 5076
Implantable Lead Model: 6935
Implantable Pulse Generator Implant Date: 20200930
Lead Channel Impedance Value: 304 Ohm
Lead Channel Impedance Value: 361 Ohm
Lead Channel Impedance Value: 513 Ohm
Lead Channel Pacing Threshold Amplitude: 0.625 V
Lead Channel Pacing Threshold Amplitude: 0.875 V
Lead Channel Pacing Threshold Pulse Width: 0.4 ms
Lead Channel Pacing Threshold Pulse Width: 0.4 ms
Lead Channel Sensing Intrinsic Amplitude: 15 mV
Lead Channel Sensing Intrinsic Amplitude: 16.75 mV
Lead Channel Sensing Intrinsic Amplitude: 2.375 mV
Lead Channel Sensing Intrinsic Amplitude: 3.25 mV
Lead Channel Setting Pacing Amplitude: 2 V
Lead Channel Setting Pacing Amplitude: 2.5 V
Lead Channel Setting Pacing Pulse Width: 0.4 ms
Lead Channel Setting Sensing Sensitivity: 0.3 mV
Zone Setting Status: 755011
Zone Setting Status: 755011

## 2022-03-15 NOTE — Patient Instructions (Signed)
Medication Instructions:  Your physician recommends that you continue on your current medications as directed. Please refer to the Current Medication list given to you today.  *If you need a refill on your cardiac medications before your next appointment, please call your pharmacy*   Lab Work: NONE   If you have labs (blood work) drawn today and your tests are completely normal, you will receive your results only by: MyChart Message (if you have MyChart) OR A paper copy in the mail If you have any lab test that is abnormal or we need to change your treatment, we will call you to review the results.   Testing/Procedures: NONE    Follow-Up: At [facility], you and your health needs are our priority.  As part of our continuing mission to provide you with exceptional heart care, we have created designated Provider Care Teams.  These Care Teams include your primary Cardiologist (physician) and Advanced Practice Providers (APPs -  Physician Assistants and Nurse Practitioners) who all work together to provide you with the care you need, when you need it.  We recommend signing up for the patient portal called "MyChart".  Sign up information is provided on this After Visit Summary.  MyChart is used to connect with patients for Virtual Visits (Telemedicine).  Patients are able to view lab/test results, encounter notes, upcoming appointments, etc.  Non-urgent messages can be sent to your provider as well.   To learn more about what you can do with MyChart, go to [email or web address].    Your next appointment:   1 year(s)  Provider:   Gregg Taylor, MD    Other Instructions Thank you for choosing [facility]!    

## 2022-03-15 NOTE — Progress Notes (Signed)
HPI Mr. Borsuk returns today for ongoing evaluation and management of his ICD in the setting of an ICM. He is s/p MI over 3 years ago. Since his ICD insertion, he has done well. He denies chest pain, sob, or ICD therapy. He has managed to avoid Covid 19. He remains active maintaining his church.  No Known Allergies   Current Outpatient Medications  Medication Sig Dispense Refill   acetaminophen (TYLENOL) 325 MG tablet Take 650 mg by mouth every 6 (six) hours as needed for mild pain.     aspirin 81 MG EC tablet Take 1 tablet (81 mg total) by mouth daily. 30 tablet 0   atorvastatin (LIPITOR) 80 MG tablet TAKE ONE TABLET (80MG  TOTAL) BY MOUTH DAILY AT 6PM 90 tablet 3   carvedilol (COREG) 6.25 MG tablet TAKE ONE TABLET (6.25MG  TOTAL) BY MOUTH TWO TIMES DAILY WITH A MEAL 180 tablet 3   clopidogrel (PLAVIX) 75 MG tablet TAKE ONE (1) TABLET BY MOUTH EVERY DAY 90 tablet 3   cyclobenzaprine (FLEXERIL) 10 MG tablet Take 10 mg by mouth daily as needed for muscle spasms.     empagliflozin (JARDIANCE) 10 MG TABS tablet Take 1 tablet (10 mg total) by mouth daily before breakfast. 90 tablet 3   ENTRESTO 24-26 MG TAKE ONE TABLET BY MOUTH TWICE A DAY 60 tablet 11   omeprazole (PRILOSEC) 20 MG capsule Take 20 mg by mouth every evening.     No current facility-administered medications for this visit.     Past Medical History:  Diagnosis Date   AICD (automatic cardioverter/defibrillator) present    Chronic neck pain    Coronary artery disease    a. 11/2017: s/p anterolateral STEMI with cath showing severe thrombotic occlusion of proximal-LAD --> PCI/DES performed   Essential hypertension    Headache(784.0)    Hypercholesteremia    ICD (implantable cardioverter-defibrillator) in place    Medtronic, September 2020 - Dr. Lovena Le   Ischemic cardiomyopathy    Migraine    Muscle spasms of neck    Myocardial infarction ([place]) 2019    ROS:   All systems reviewed and negative except as noted in the  HPI.   Past Surgical History:  Procedure Laterality Date   CHOLECYSTECTOMY     1/12   COLONOSCOPY  03/15/2011   Procedure: COLONOSCOPY;  Surgeon: Rogene Houston, MD;  [facility];  Service: Endoscopy;  Laterality: N/A;  9:30 am   COLONOSCOPY WITH PROPOFOL N/A 05/11/2021   Procedure: COLONOSCOPY WITH PROPOFOL;  Surgeon: Rogene Houston, MD;  [facility];  Service: Endoscopy;  Laterality: N/A;  820   CORONARY STENT INTERVENTION N/A 12/03/2017   Procedure: CORONARY STENT INTERVENTION;  Surgeon: Sherren Mocha, MD;  Location: [place] CV LAB;  Service: Cardiovascular;  Laterality: N/A;   ESOPHAGOGASTRODUODENOSCOPY N/A 06/26/2012   Procedure: ESOPHAGOGASTRODUODENOSCOPY (EGD);  Surgeon: Rogene Houston, MD;  [facility];  Service: Endoscopy;  Laterality: N/A;   fractured jaw     fractured nose     ICD IMPLANT N/A 11/27/2018   Procedure: ICD IMPLANT;  Surgeon: Evans Lance, MD;  Location: [place] CV LAB;  Service: Cardiovascular;  Laterality: N/A;   left acl repair     LEFT HEART CATH AND CORONARY ANGIOGRAPHY N/A 12/03/2017   Procedure: LEFT HEART CATH AND CORONARY ANGIOGRAPHY;  Surgeon: Sherren Mocha, MD;  Location: [place] CV LAB;  Service: Cardiovascular;  Laterality: N/A;   Left Knee Arthroscopy  TIBIA FRACTURE SURGERY       Family History  Problem Relation Age of Onset   Cancer Mother    Colon cancer Neg Hx    Heart disease Neg Hx      Social History   Socioeconomic History   Marital status: Married    Spouse name: Joy   Number of children: Not on file   Years of education: Not on file   Highest education level: Not on file  Occupational History   Occupation: Former Academic librarian   Tobacco Use   Smoking status: Never   Smokeless tobacco: Never  Vaping Use   Vaping Use: Never used  Substance and Sexual Activity   Alcohol use: No   Drug use: No   Sexual activity: Not on file  Other Topics Concern   Not on file   Social History Narrative   Not on file   Social Determinants of Health   Financial Resource Strain: Not on file  Food Insecurity: Not on file  Transportation Needs: Not on file  Physical Activity: Not on file  Stress: Not on file  Social Connections: Not on file  Intimate Partner Violence: Not on file     BP 124/68   Pulse 60   Ht 6' (1.829 m)   Wt 207 lb (93.9 kg)   SpO2 (!) 0%   BMI 28.07 kg/m   Physical Exam:  Well appearing 82 yo man, NAD HEENT: Unremarkable Neck:  No JVD, no thyromegally Lymphatics:  No adenopathy Back:  No CVA tenderness Lungs:  Clear with no wheezes HEART:  Regular rate rhythm, no murmurs, no rubs, no clicks Abd:  soft, positive bowel sounds, no organomegally, no rebound, no guarding Ext:  2 plus pulses, no edema, no cyanosis, no clubbing Skin:  No rashes no nodules Neuro:  CN II through XII intact, motor grossly intact  DEVICE  Normal device function.  See PaceArt for details.   Assess/Plan: 1. Chronic systolic heart failure -His symptoms remain class 2A. He will continue his current meds. 2. CAD - he denies anginal symptoms. He is encouraged to increase his activity. 3. ICD - his medtronic dual chamber ICD is working normally. We will recheck in several months.  4. Dyslipidemia - he will continue high dose statin therapy with lipitor.   Carleene Overlie Kaydra Borgen,MD

## 2022-04-04 ENCOUNTER — Ambulatory Visit: Payer: Medicare PPO

## 2022-04-04 DIAGNOSIS — I255 Ischemic cardiomyopathy: Secondary | ICD-10-CM

## 2022-04-04 LAB — CUP PACEART REMOTE DEVICE CHECK
Battery Remaining Longevity: 89 mo
Battery Voltage: 3 V
Brady Statistic AP VP Percent: 0.09 %
Brady Statistic AP VS Percent: 55.09 %
Brady Statistic AS VP Percent: 0.04 %
Brady Statistic AS VS Percent: 44.78 %
Brady Statistic RA Percent Paced: 54.78 %
Brady Statistic RV Percent Paced: 0.15 %
Date Time Interrogation Session: 20240206072303
HighPow Impedance: 72 Ohm
Implantable Lead Connection Status: 753985
Implantable Lead Connection Status: 753985
Implantable Lead Implant Date: 20200930
Implantable Lead Implant Date: 20200930
Implantable Lead Location: 753859
Implantable Lead Location: 753860
Implantable Lead Model: 5076
Implantable Lead Model: 6935
Implantable Pulse Generator Implant Date: 20200930
Lead Channel Impedance Value: 285 Ohm
Lead Channel Impedance Value: 342 Ohm
Lead Channel Impedance Value: 475 Ohm
Lead Channel Pacing Threshold Amplitude: 0.625 V
Lead Channel Pacing Threshold Amplitude: 0.875 V
Lead Channel Pacing Threshold Pulse Width: 0.4 ms
Lead Channel Pacing Threshold Pulse Width: 0.4 ms
Lead Channel Sensing Intrinsic Amplitude: 14.25 mV
Lead Channel Sensing Intrinsic Amplitude: 14.25 mV
Lead Channel Sensing Intrinsic Amplitude: 2.625 mV
Lead Channel Sensing Intrinsic Amplitude: 2.625 mV
Lead Channel Setting Pacing Amplitude: 2 V
Lead Channel Setting Pacing Amplitude: 2.5 V
Lead Channel Setting Pacing Pulse Width: 0.4 ms
Lead Channel Setting Sensing Sensitivity: 0.3 mV
Zone Setting Status: 755011
Zone Setting Status: 755011

## 2022-04-05 DIAGNOSIS — Z85828 Personal history of other malignant neoplasm of skin: Secondary | ICD-10-CM | POA: Diagnosis not present

## 2022-04-05 DIAGNOSIS — D044 Carcinoma in situ of skin of scalp and neck: Secondary | ICD-10-CM | POA: Diagnosis not present

## 2022-04-05 DIAGNOSIS — D485 Neoplasm of uncertain behavior of skin: Secondary | ICD-10-CM | POA: Diagnosis not present

## 2022-04-05 DIAGNOSIS — L578 Other skin changes due to chronic exposure to nonionizing radiation: Secondary | ICD-10-CM | POA: Diagnosis not present

## 2022-04-05 DIAGNOSIS — L570 Actinic keratosis: Secondary | ICD-10-CM | POA: Diagnosis not present

## 2022-05-02 NOTE — Progress Notes (Signed)
Remote ICD transmission.   

## 2022-07-04 ENCOUNTER — Ambulatory Visit (INDEPENDENT_AMBULATORY_CARE_PROVIDER_SITE_OTHER): Payer: Medicare PPO

## 2022-07-04 DIAGNOSIS — I255 Ischemic cardiomyopathy: Secondary | ICD-10-CM | POA: Diagnosis not present

## 2022-07-04 LAB — CUP PACEART REMOTE DEVICE CHECK
Battery Remaining Longevity: 87 mo
Battery Voltage: 3 V
Brady Statistic AP VP Percent: 0.05 %
Brady Statistic AP VS Percent: 52.07 %
Brady Statistic AS VP Percent: 0.01 %
Brady Statistic AS VS Percent: 47.87 %
Brady Statistic RA Percent Paced: 50.94 %
Brady Statistic RV Percent Paced: 0.07 %
Date Time Interrogation Session: 20240507052727
HighPow Impedance: 76 Ohm
Implantable Lead Connection Status: 753985
Implantable Lead Connection Status: 753985
Implantable Lead Implant Date: 20200930
Implantable Lead Implant Date: 20200930
Implantable Lead Location: 753859
Implantable Lead Location: 753860
Implantable Lead Model: 5076
Implantable Lead Model: 6935
Implantable Pulse Generator Implant Date: 20200930
Lead Channel Impedance Value: 285 Ohm
Lead Channel Impedance Value: 342 Ohm
Lead Channel Impedance Value: 475 Ohm
Lead Channel Pacing Threshold Amplitude: 0.625 V
Lead Channel Pacing Threshold Amplitude: 0.875 V
Lead Channel Pacing Threshold Pulse Width: 0.4 ms
Lead Channel Pacing Threshold Pulse Width: 0.4 ms
Lead Channel Sensing Intrinsic Amplitude: 15.375 mV
Lead Channel Sensing Intrinsic Amplitude: 15.375 mV
Lead Channel Sensing Intrinsic Amplitude: 2.375 mV
Lead Channel Sensing Intrinsic Amplitude: 2.375 mV
Lead Channel Setting Pacing Amplitude: 2 V
Lead Channel Setting Pacing Amplitude: 2.5 V
Lead Channel Setting Pacing Pulse Width: 0.4 ms
Lead Channel Setting Sensing Sensitivity: 0.3 mV
Zone Setting Status: 755011
Zone Setting Status: 755011

## 2022-07-26 NOTE — Progress Notes (Signed)
Remote ICD transmission.   

## 2022-07-31 DIAGNOSIS — L918 Other hypertrophic disorders of the skin: Secondary | ICD-10-CM | POA: Diagnosis not present

## 2022-07-31 DIAGNOSIS — D1801 Hemangioma of skin and subcutaneous tissue: Secondary | ICD-10-CM | POA: Diagnosis not present

## 2022-07-31 DIAGNOSIS — L814 Other melanin hyperpigmentation: Secondary | ICD-10-CM | POA: Diagnosis not present

## 2022-07-31 DIAGNOSIS — Z85828 Personal history of other malignant neoplasm of skin: Secondary | ICD-10-CM | POA: Diagnosis not present

## 2022-07-31 DIAGNOSIS — L905 Scar conditions and fibrosis of skin: Secondary | ICD-10-CM | POA: Diagnosis not present

## 2022-07-31 DIAGNOSIS — L821 Other seborrheic keratosis: Secondary | ICD-10-CM | POA: Diagnosis not present

## 2022-08-15 DIAGNOSIS — I5022 Chronic systolic (congestive) heart failure: Secondary | ICD-10-CM | POA: Diagnosis not present

## 2022-08-15 DIAGNOSIS — I2510 Atherosclerotic heart disease of native coronary artery without angina pectoris: Secondary | ICD-10-CM | POA: Diagnosis not present

## 2022-09-13 ENCOUNTER — Ambulatory Visit: Payer: Medicare PPO | Attending: Cardiology | Admitting: Cardiology

## 2022-09-13 ENCOUNTER — Encounter: Payer: Self-pay | Admitting: Cardiology

## 2022-09-13 VITALS — BP 124/76 | HR 60 | Ht 72.0 in | Wt 199.0 lb

## 2022-09-13 DIAGNOSIS — Z95810 Presence of automatic (implantable) cardiac defibrillator: Secondary | ICD-10-CM

## 2022-09-13 DIAGNOSIS — I5020 Unspecified systolic (congestive) heart failure: Secondary | ICD-10-CM

## 2022-09-13 DIAGNOSIS — I25119 Atherosclerotic heart disease of native coronary artery with unspecified angina pectoris: Secondary | ICD-10-CM | POA: Diagnosis not present

## 2022-09-13 DIAGNOSIS — E782 Mixed hyperlipidemia: Secondary | ICD-10-CM | POA: Diagnosis not present

## 2022-09-13 NOTE — Progress Notes (Signed)
Cardiology Office Note  Date: 09/13/2022   ID: [name], DOB 07-Oct-1940, MRN [id number]  History of Present Illness: Thomas Oscar. is an 82 y.o. male last seen in November 2023.  He is here for a routine visit.  Reports NYHA class II dyspnea, no angina, no palpitations or syncope.  His weight is down and he does not report any obvious fluid retention.  Medtronic ICD in place with follow-up by Dr. [name].  No device shocks or syncope.  Device interrogation in May revealed normal function.  I reviewed his medications which are unchanged.  He will have lab work later this year with Dr. [name].  We discussed getting an updated echocardiogram in comparison to study from June 2023.  Physical Exam: VS:  BP 124/76   Pulse 60   Ht 6' (1.829 m)   Wt 199 lb (90.3 kg)   SpO2 97%   BMI 26.99 kg/m , BMI Body mass index is 26.99 kg/m.  Wt Readings from Last 3 Encounters:  09/13/22 199 lb (90.3 kg)  03/15/22 207 lb (93.9 kg)  01/26/22 211 lb 9.6 oz (96 kg)    General: Patient appears comfortable at rest. HEENT: Conjunctiva and lids normal. Neck: Supple, no elevated JVP or carotid bruits. Lungs: Clear to auscultation, nonlabored breathing at rest. Cardiac: Regular rate and rhythm, no S3 or significant systolic murmur. Extremities: No pitting edema.  ECG:  An ECG dated 01/26/2022 was personally reviewed today and demonstrated:  Atrial paced rhythm with old anterior infarct pattern.  Labwork:  December 2023: Hemoglobin 14.9, platelets 300, BUN 18, creatinine 1.01, potassium 4, AST 21, ALT 19, cholesterol 100, triglycerides 154, HDL 39, LDL 35  Other Studies Reviewed Today:  Echocardiogram 08/02/2021:  1. Septal,apical akinesis inferior hypokinesis EF and RWMA;s similar to  TTE done 12/29/19 . Left ventricular ejection fraction, by estimation, is  30 to 35%. The left ventricle has moderately decreased function. The left  ventricle demonstrates regional  wall motion  abnormalities (see scoring diagram/findings for description).  There is mild left ventricular hypertrophy of the basal and septal  segments. Left ventricular diastolic parameters were normal.   2. AICD leads in RA/RV. Right ventricular systolic function is normal.  The right ventricular size is normal.   3. Left atrial size was mildly dilated.   4. The mitral valve is normal in structure. No evidence of mitral valve  regurgitation. No evidence of mitral stenosis.   5. The aortic valve is tricuspid. There is mild calcification of the  aortic valve. Aortic valve regurgitation is not visualized. Aortic valve  sclerosis is present, with no evidence of aortic valve stenosis.   6. Aortic dilatation noted. There is mild dilatation of the ascending  aorta, measuring 38 mm.   7. The inferior vena cava is normal in size with greater than 50%  respiratory variability, suggesting right atrial pressure of 3 mmHg.   Assessment and Plan:  1.  HFrEF with ischemic cardiomyopathy, LVEF 30 to 35% by echocardiogram in June 2023.  Clinically stable on current regimen including Coreg, Entresto, and Jardiance.  Did not tolerate Aldactone previously due to orthostasis.  Plan to update echocardiogram.  2.  CAD status post anterolateral STEMI due to thrombotic occlusion of the proximal LAD treated with DES in 2019.  No active angina at this time.  He remains on aspirin, Plavix, and Lipitor.  3.  Essential hypertension.  Blood pressure well-controlled today.  4.  Mixed hyperlipidemia.  LDL 35  in December 2023.  Continue Lipitor at current dose.  5.  Medtronic ICD in place with follow-up by Dr. [name].  No device shocks or syncope.  Disposition:  Follow up  6 months.  Signed, Jonelle Sidle, M.D., F.A.C.C.  [facility] at [facility]

## 2022-09-13 NOTE — Patient Instructions (Signed)
 Medication Instructions:  Your physician recommends that you continue on your current medications as directed. Please refer to the Current Medication list given to you today.   Labwork: None today   Testing/Procedures: Your physician has requested that you have an echocardiogram. Echocardiography is a painless test that uses sound waves to create images of your heart. It provides your doctor with information about the size and shape of your heart and how well your heart's chambers and valves are working. This procedure takes approximately one hour. There are no restrictions for this procedure. Please do NOT wear cologne, perfume, aftershave, or lotions (deodorant is allowed). Please arrive 15 minutes prior to your appointment time.   Follow-Up: 6 months  Any Other Special Instructions Will Be Listed Below (If Applicable).  If you need a refill on your cardiac medications before your next appointment, please call your pharmacy.  

## 2022-10-02 ENCOUNTER — Other Ambulatory Visit: Payer: Self-pay | Admitting: Cardiology

## 2022-10-03 ENCOUNTER — Ambulatory Visit (INDEPENDENT_AMBULATORY_CARE_PROVIDER_SITE_OTHER): Payer: Medicare PPO

## 2022-10-03 DIAGNOSIS — I255 Ischemic cardiomyopathy: Secondary | ICD-10-CM

## 2022-10-03 DIAGNOSIS — I5020 Unspecified systolic (congestive) heart failure: Secondary | ICD-10-CM | POA: Diagnosis not present

## 2022-10-19 NOTE — Progress Notes (Signed)
Remote ICD transmission.   

## 2022-10-20 ENCOUNTER — Ambulatory Visit (HOSPITAL_COMMUNITY)
Admission: RE | Admit: 2022-10-20 | Discharge: 2022-10-20 | Disposition: A | Discharge status: Home / Self Care | Payer: Medicare PPO | Source: Ambulatory Visit | Attending: Cardiology | Admitting: Cardiology

## 2022-10-20 DIAGNOSIS — I5020 Unspecified systolic (congestive) heart failure: Secondary | ICD-10-CM | POA: Insufficient documentation

## 2022-10-20 DIAGNOSIS — I428 Other cardiomyopathies: Secondary | ICD-10-CM | POA: Diagnosis not present

## 2022-10-20 LAB — ECHOCARDIOGRAM COMPLETE
Area-P 1/2: 2.66 cm2
S' Lateral: 4.3 cm

## 2022-10-20 MED ORDER — PERFLUTREN LIPID MICROSPHERE
1.0000 mL | INTRAVENOUS | Status: AC | PRN
  Administered 2022-10-20: 3 mL via INTRAVENOUS

## 2022-10-20 NOTE — Progress Notes (Signed)
*  PRELIMINARY RESULTS* Echocardiogram 2D Echocardiogram has been performed with Definity.  Stacey Drain 10/20/2022, 10:39 AM

## 2022-11-01 DIAGNOSIS — H02831 Dermatochalasis of right upper eyelid: Secondary | ICD-10-CM | POA: Diagnosis not present

## 2022-11-01 DIAGNOSIS — H10413 Chronic giant papillary conjunctivitis, bilateral: Secondary | ICD-10-CM | POA: Diagnosis not present

## 2022-11-01 DIAGNOSIS — H43812 Vitreous degeneration, left eye: Secondary | ICD-10-CM | POA: Diagnosis not present

## 2022-11-01 DIAGNOSIS — H02834 Dermatochalasis of left upper eyelid: Secondary | ICD-10-CM | POA: Diagnosis not present

## 2022-11-01 DIAGNOSIS — Q141 Congenital malformation of retina: Secondary | ICD-10-CM | POA: Diagnosis not present

## 2022-11-01 DIAGNOSIS — D3132 Benign neoplasm of left choroid: Secondary | ICD-10-CM | POA: Diagnosis not present

## 2023-01-02 ENCOUNTER — Ambulatory Visit (INDEPENDENT_AMBULATORY_CARE_PROVIDER_SITE_OTHER): Payer: Medicare PPO

## 2023-01-02 DIAGNOSIS — I5020 Unspecified systolic (congestive) heart failure: Secondary | ICD-10-CM

## 2023-01-02 DIAGNOSIS — I255 Ischemic cardiomyopathy: Secondary | ICD-10-CM | POA: Diagnosis not present

## 2023-01-02 LAB — CUP PACEART REMOTE DEVICE CHECK
Battery Remaining Longevity: 78 mo
Battery Voltage: 3 V
Brady Statistic AP VP Percent: 0.08 %
Brady Statistic AP VS Percent: 69.32 %
Brady Statistic AS VP Percent: 0.01 %
Brady Statistic AS VS Percent: 30.59 %
Brady Statistic RA Percent Paced: 68.26 %
Brady Statistic RV Percent Paced: 0.1 %
Date Time Interrogation Session: 20241105061806
HighPow Impedance: 70 Ohm
Implantable Lead Connection Status: 753985
Implantable Lead Connection Status: 753985
Implantable Lead Implant Date: 20200930
Implantable Lead Implant Date: 20200930
Implantable Lead Location: 753859
Implantable Lead Location: 753860
Implantable Lead Model: 5076
Implantable Lead Model: 6935
Implantable Pulse Generator Implant Date: 20200930
Lead Channel Impedance Value: 304 Ohm
Lead Channel Impedance Value: 399 Ohm
Lead Channel Impedance Value: 551 Ohm
Lead Channel Pacing Threshold Amplitude: 0.625 V
Lead Channel Pacing Threshold Amplitude: 0.875 V
Lead Channel Pacing Threshold Pulse Width: 0.4 ms
Lead Channel Pacing Threshold Pulse Width: 0.4 ms
Lead Channel Sensing Intrinsic Amplitude: 14.375 mV
Lead Channel Sensing Intrinsic Amplitude: 14.375 mV
Lead Channel Sensing Intrinsic Amplitude: 2.125 mV
Lead Channel Sensing Intrinsic Amplitude: 2.125 mV
Lead Channel Setting Pacing Amplitude: 2 V
Lead Channel Setting Pacing Amplitude: 2.5 V
Lead Channel Setting Pacing Pulse Width: 0.4 ms
Lead Channel Setting Sensing Sensitivity: 0.3 mV
Zone Setting Status: 755011
Zone Setting Status: 755011

## 2023-01-08 ENCOUNTER — Other Ambulatory Visit: Payer: Self-pay | Admitting: Cardiology

## 2023-01-11 DIAGNOSIS — Z23 Encounter for immunization: Secondary | ICD-10-CM | POA: Diagnosis not present

## 2023-01-15 ENCOUNTER — Other Ambulatory Visit: Payer: Self-pay | Admitting: Cardiology

## 2023-01-30 NOTE — Progress Notes (Signed)
Remote ICD transmission.   

## 2023-02-16 DIAGNOSIS — Z79899 Other long term (current) drug therapy: Secondary | ICD-10-CM | POA: Diagnosis not present

## 2023-02-16 DIAGNOSIS — E785 Hyperlipidemia, unspecified: Secondary | ICD-10-CM | POA: Diagnosis not present

## 2023-02-16 DIAGNOSIS — I5020 Unspecified systolic (congestive) heart failure: Secondary | ICD-10-CM | POA: Diagnosis not present

## 2023-02-16 DIAGNOSIS — I2510 Atherosclerotic heart disease of native coronary artery without angina pectoris: Secondary | ICD-10-CM | POA: Diagnosis not present

## 2023-02-16 DIAGNOSIS — K219 Gastro-esophageal reflux disease without esophagitis: Secondary | ICD-10-CM | POA: Diagnosis not present

## 2023-02-16 DIAGNOSIS — I10 Essential (primary) hypertension: Secondary | ICD-10-CM | POA: Diagnosis not present

## 2023-03-05 DIAGNOSIS — E785 Hyperlipidemia, unspecified: Secondary | ICD-10-CM | POA: Diagnosis not present

## 2023-03-05 DIAGNOSIS — I2510 Atherosclerotic heart disease of native coronary artery without angina pectoris: Secondary | ICD-10-CM | POA: Diagnosis not present

## 2023-03-05 DIAGNOSIS — Z0001 Encounter for general adult medical examination with abnormal findings: Secondary | ICD-10-CM | POA: Diagnosis not present

## 2023-03-05 DIAGNOSIS — I5030 Unspecified diastolic (congestive) heart failure: Secondary | ICD-10-CM | POA: Diagnosis not present

## 2023-03-05 DIAGNOSIS — Z95810 Presence of automatic (implantable) cardiac defibrillator: Secondary | ICD-10-CM | POA: Diagnosis not present

## 2023-03-12 ENCOUNTER — Ambulatory Visit: Payer: Medicare PPO | Attending: Medical | Admitting: Medical

## 2023-03-12 ENCOUNTER — Encounter: Payer: Self-pay | Admitting: Medical

## 2023-03-12 VITALS — BP 138/68 | HR 60 | Ht 72.0 in | Wt 204.0 lb

## 2023-03-12 DIAGNOSIS — I255 Ischemic cardiomyopathy: Secondary | ICD-10-CM | POA: Diagnosis not present

## 2023-03-12 DIAGNOSIS — I10 Essential (primary) hypertension: Secondary | ICD-10-CM | POA: Diagnosis not present

## 2023-03-12 DIAGNOSIS — I5020 Unspecified systolic (congestive) heart failure: Secondary | ICD-10-CM

## 2023-03-12 DIAGNOSIS — E782 Mixed hyperlipidemia: Secondary | ICD-10-CM

## 2023-03-12 DIAGNOSIS — I2510 Atherosclerotic heart disease of native coronary artery without angina pectoris: Secondary | ICD-10-CM

## 2023-03-12 DIAGNOSIS — Z95810 Presence of automatic (implantable) cardiac defibrillator: Secondary | ICD-10-CM | POA: Diagnosis not present

## 2023-03-12 NOTE — Patient Instructions (Signed)
 Medication Instructions:  Your physician recommends that you continue on your current medications as directed. Please refer to the Current Medication list given to you today.  *If you need a refill on your cardiac medications before your next appointment, please call your pharmacy*   Lab Work: None If you have labs (blood work) drawn today and your tests are completely normal, you will receive your results only by: MyChart Message (if you have MyChart) OR A paper copy in the mail If you have any lab test that is abnormal or we need to change your treatment, we will call you to review the results.   Testing/Procedures: None   Follow-Up: At [facility], you and your health needs are our priority.  As part of our continuing mission to provide you with exceptional heart care, we have created designated Provider Care Teams.  These Care Teams include your primary Cardiologist (physician) and Advanced Practice Providers (APPs -  Physician Assistants and Nurse Practitioners) who all work together to provide you with the care you need, when you need it.  We recommend signing up for the patient portal called "MyChart".  Sign up information is provided on this After Visit Summary.  MyChart is used to connect with patients for Virtual Visits (Telemedicine).  Patients are able to view lab/test results, encounter notes, upcoming appointments, etc.  Non-urgent messages can be sent to your provider as well.   To learn more about what you can do with MyChart, go to [email or web address].    Your next appointment:   6 month(s)  Provider:   You may see [name], MD or one of the following Advanced Practice Providers on your designated Care Team:   Randall An, PA-C  Jacolyn Reedy, [place]     Other Instructions

## 2023-03-12 NOTE — Progress Notes (Signed)
 Cardiology Office Note:    Date:  03/12/2023   ID:  Thomas Black., DOB 01/31/1941, MRN [id number]  PCP:  Sheryle Carwin, MD  [facility] Cardiologist:  Jayson Sierras, MD  [facility] Electrophysiologist:  Danelle Birmingham, MD   Referring MD: Sheryle Carwin, MD   Chief Complaint: 6 month follow-up  History of Present Illness:    Thomas Happ. is a 83 y.o. male with a hx of HFrEF LVEF 30-35%, CAD s/p STEMI due to thrombotic occlusion of the pLAD s/p DES in 2019, HTN, HLD, medtronic ICD followed by EP.   The patient was last seen 08/2022 and was stable from a cardiac perspective. Repeat echo showed LVEF 30-35%, mild LVH, g1dd, sluggish flow at LV apex without thrombus.  Today, the patient is overall doing well. He denies chest pain, SOB, LLE, orthopnea, pnd, lightheadedness, dizziness, falls, LOC. He walks 2-3 miles 2-3 times weekly. Diet is good. BP a little high.   Past Medical History:  Diagnosis Date   AICD (automatic cardioverter/defibrillator) present    Chronic neck pain    Coronary artery disease    a. 11/2017: s/p anterolateral STEMI with cath showing severe thrombotic occlusion of proximal-LAD --> PCI/DES performed   Essential hypertension    Headache(784.0)    Hypercholesteremia    ICD (implantable cardioverter-defibrillator) in place    Medtronic, September 2020 - Dr. Birmingham   Ischemic cardiomyopathy    Migraine    Muscle spasms of neck    Myocardial infarction [facility]) 2019    Past Surgical History:  Procedure Laterality Date   CHOLECYSTECTOMY     1/12   COLONOSCOPY  03/15/2011   Procedure: COLONOSCOPY;  Surgeon: Claudis RAYMOND Rivet, MD;  [facility];  Service: Endoscopy;  Laterality: N/A;  9:30 am   COLONOSCOPY WITH PROPOFOL  N/A 05/11/2021   Procedure: COLONOSCOPY WITH PROPOFOL ;  Surgeon: Rivet Claudis RAYMOND, MD;  [facility];  Service: Endoscopy;  Laterality: N/A;  820   CORONARY STENT INTERVENTION N/A 12/03/2017   Procedure: CORONARY STENT  INTERVENTION;  Surgeon: Wonda Sharper, MD;  Location: [facility] INVASIVE CV LAB;  Service: Cardiovascular;  Laterality: N/A;   ESOPHAGOGASTRODUODENOSCOPY N/A 06/26/2012   Procedure: ESOPHAGOGASTRODUODENOSCOPY (EGD);  Surgeon: Claudis RAYMOND Rivet, MD;  [facility];  Service: Endoscopy;  Laterality: N/A;   fractured jaw     fractured nose     ICD IMPLANT N/A 11/27/2018   Procedure: ICD IMPLANT;  Surgeon: Birmingham Danelle ORN, MD;  Location: [facility] INVASIVE CV LAB;  Service: Cardiovascular;  Laterality: N/A;   left acl repair     LEFT HEART CATH AND CORONARY ANGIOGRAPHY N/A 12/03/2017   Procedure: LEFT HEART CATH AND CORONARY ANGIOGRAPHY;  Surgeon: Wonda Sharper, MD;  Location: [facility] INVASIVE CV LAB;  Service: Cardiovascular;  Laterality: N/A;   Left Knee Arthroscopy     TIBIA FRACTURE SURGERY      Current Medications: Current Meds  Medication Sig   acetaminophen  (TYLENOL ) 325 MG tablet Take 650 mg by mouth every 6 (six) hours as needed for mild pain.   aspirin  81 MG EC tablet Take 1 tablet (81 mg total) by mouth daily.   atorvastatin  (LIPITOR ) 80 MG tablet TAKE ONE TABLET (80MG  TOTAL) BY MOUTH DAILY AT 6PM   carvedilol  (COREG ) 6.25 MG tablet TAKE ONE TABLET (6.25MG  TOTAL) BY MOUTH TWO TIMES DAILY WITH A MEAL   clopidogrel  (PLAVIX ) 75 MG tablet TAKE ONE (1) TABLET BY MOUTH EVERY DAY   cyclobenzaprine  (FLEXERIL )  10 MG tablet Take 10 mg by mouth daily as needed for muscle spasms.   empagliflozin  (JARDIANCE ) 10 MG TABS tablet TAKE ONE TABLET (10MG  TOTAL) BY MOUTH DAILY BEFORE BREAKFAST   ENTRESTO  24-26 MG TAKE ONE TABLET BY MOUTH TWICE A DAY   omeprazole (PRILOSEC) 20 MG capsule Take 20 mg by mouth every evening.     Allergies:   Patient has no known allergies.   Social History   Socioeconomic History   Marital status: Married    Spouse name: Thomas Black   Number of children: Not on file   Years of education: Not on file   Highest education level: Not on file  Occupational History   Occupation: Former  tefl teacher   Tobacco Use   Smoking status: Never   Smokeless tobacco: Never  Vaping Use   Vaping status: Never Used  Substance and Sexual Activity   Alcohol use: No   Drug use: No   Sexual activity: Not on file  Other Topics Concern   Not on file  Social History Narrative   Not on file   Social Drivers of Health   Financial Resource Strain: Not on file  Food Insecurity: No Food Insecurity (03/30/2020)   Received from [facility], [facility]   Hunger Vital Sign    Worried About Running Out of Food in the Last Year: Never true    Ran Out of Food in the Last Year: Never true  Transportation Needs: Not on file  Physical Activity: Not on file  Stress: Not on file  Social Connections: Unknown (07/09/2021)   Received from [facility], [facility]   Social Network    Social Network: Not on file     Family History: The patient's family history includes Cancer in his mother. There is no history of Colon cancer or Heart disease.  ROS:   Please see the history of present illness.     All other systems reviewed and are negative.  EKGs/Labs/Other Studies Reviewed:    The following studies were reviewed today:  Echo 09/2022 . Left ventricular ejection fraction, by estimation, is 30 to 35%. The  left ventricle has moderately decreased function. The left ventricle  demonstrates regional wall motion abnormalities (see scoring  diagram/findings for description). There is mild  asymmetric left ventricular hypertrophy of the basal segment. Left  ventricular diastolic parameters are consistent with Grade I diastolic  dysfunction (impaired relaxation).   2. Sluggish flow at LV apex without formed mural thrombus by Definity   contrast.   3. Right ventricular systolic function is normal. The right ventricular  size is normal. Tricuspid regurgitation signal is inadequate for assessing  PA pressure.   4. The mitral valve is grossly normal. Trivial mitral valve  regurgitation.    5. The aortic valve is tricuspid. Aortic valve regurgitation is not  visualized. No aortic stenosis is present.   6. The inferior vena cava is normal in size with <50% respiratory  variability, suggesting right atrial pressure of 8 mmHg.   Echo 2023  1. Septal,apical akinesis inferior hypokinesis EF and RWMA;s similar to  TTE done 12/29/19 . Left ventricular ejection fraction, by estimation, is  30 to 35%. The left ventricle has moderately decreased function. The left  ventricle demonstrates regional  wall motion abnormalities (see scoring diagram/findings for description).  There is mild left ventricular hypertrophy of the basal and septal  segments. Left ventricular diastolic parameters were normal.   2. AICD leads in RA/RV. Right ventricular systolic function is  normal.  The right ventricular size is normal.   3. Left atrial size was mildly dilated.   4. The mitral valve is normal in structure. No evidence of mitral valve  regurgitation. No evidence of mitral stenosis.   5. The aortic valve is tricuspid. There is mild calcification of the  aortic valve. Aortic valve regurgitation is not visualized. Aortic valve  sclerosis is present, with no evidence of aortic valve stenosis.   6. Aortic dilatation noted. There is mild dilatation of the ascending  aorta, measuring 38 mm.   7. The inferior vena cava is normal in size with greater than 50%  respiratory variability, suggesting right atrial pressure of 3 mmHg.    EKG:  EKG is ordered today.  The ekg ordered today demonstrates A paced rhythm, PRI , iRBBB, LAFB  Recent Labs: No results found for requested labs within last 365 days.  Recent Lipid Panel    Component Value Date/Time   CHOL 200 12/04/2017 0437   TRIG 163 (H) 12/04/2017 0437   HDL 60 12/04/2017 0437   CHOLHDL 3.3 12/04/2017 0437   VLDL 33 12/04/2017 0437   LDLCALC 107 (H) 12/04/2017 0437    Physical Exam:    VS:  BP 138/68 (BP Location: Left Arm, Patient  Position: Supine, Cuff Size: Normal)   Pulse 60   Ht 6' (1.829 m)   Wt 204 lb (92.5 kg)   SpO2 95%   BMI 27.67 kg/m     Wt Readings from Last 3 Encounters:  03/12/23 204 lb (92.5 kg)  09/13/22 199 lb (90.3 kg)  03/15/22 207 lb (93.9 kg)     GEN:  Well nourished, well developed in no acute distress HEENT: Normal NECK: No JVD; No carotid bruits LYMPHATICS: No lymphadenopathy CARDIAC: RRR, no murmurs, rubs, gallops RESPIRATORY:  Clear to auscultation without rales, wheezing or rhonchi  ABDOMEN: Soft, non-tender, non-distended MUSCULOSKELETAL:  No edema; No deformity  SKIN: Warm and dry NEUROLOGIC:  Alert and oriented x 3 PSYCHIATRIC:  Normal affect   ASSESSMENT:    1. Ischemic cardiomyopathy   2. HFrEF (heart failure with reduced ejection fraction) (HCC)   3. Coronary artery disease involving native coronary artery of native heart without angina pectoris   4. Hyperlipidemia, mixed   5. Essential hypertension   6. ICD (implantable cardioverter-defibrillator) in place    PLAN:    In order of problems listed above:  Chronic HFrEF/ischemic cardiomyopathy Most recent echo showed EF of 30 to 35%.  Patient is euvolemic on exam.  Continue Coreg  6.25 mg twice daily, Entresto  24-26 mg twice daily, Jardiance  10 mg daily.  He did not tolerate Aldactone  in the past due to orthostasis.     CAD Status post anterolateral STEMI due to thrombotic occlusion of the proximal LAD treated with DES placement in 2019.  Patient denies anginal symptoms.  He remains active at baseline.  Continue aspirin , Plavix , Lipitor .  Hyperlipidemia LDL 40, triglycerides 236, HDL 45, total chol 124.  Discussed triglyceride treatment.  He will work on diet changes.  Continue Lipitor  80 mg daily.  Hypertension Blood pressure mildly elevated today.  Continue current medications for now and recheck blood pressure at follow-up.  Medtronic ICD Patient follows with Dr. Waddell.  Normally functioning device on most  recent check. EKG shows A paced rhythm.   Disposition: Follow up in 6 month(s) with MD/APP     Signed, Aireonna Bauer VEAR Fishman, PA-C  03/12/2023 1:54 PM    [facility]

## 2023-03-19 ENCOUNTER — Ambulatory Visit: Payer: Medicare PPO | Attending: Internal Medicine | Admitting: Internal Medicine

## 2023-03-19 ENCOUNTER — Encounter: Payer: Self-pay | Admitting: Internal Medicine

## 2023-03-19 VITALS — BP 124/74 | HR 74 | Ht 72.0 in | Wt 200.0 lb

## 2023-03-19 DIAGNOSIS — I5022 Chronic systolic (congestive) heart failure: Secondary | ICD-10-CM

## 2023-03-19 LAB — CUP PACEART INCLINIC DEVICE CHECK
Date Time Interrogation Session: 20250120100611
Implantable Lead Connection Status: 753985
Implantable Lead Connection Status: 753985
Implantable Lead Implant Date: 20200930
Implantable Lead Implant Date: 20200930
Implantable Lead Location: 753859
Implantable Lead Location: 753860
Implantable Lead Model: 5076
Implantable Lead Model: 6935
Implantable Pulse Generator Implant Date: 20200930

## 2023-03-19 NOTE — Progress Notes (Signed)
HPI Thomas Black returns today for ongoing evaluation and management of his ICD in the setting of an ICM. He is s/p MI over 4 years ago. Since his ICD insertion, he has done well. He denies chest pain, sob, or ICD therapy. He has managed to avoid Covid 19. He remains active maintaining his church. In addition he is remodeling a house that he intends to rent.   No Known Allergies   Current Outpatient Medications  Medication Sig Dispense Refill   acetaminophen (TYLENOL) 325 MG tablet Take 650 mg by mouth every 6 (six) hours as needed for mild pain.     aspirin 81 MG EC tablet Take 1 tablet (81 mg total) by mouth daily. 30 tablet 0   atorvastatin (LIPITOR) 80 MG tablet TAKE ONE TABLET (80MG  TOTAL) BY MOUTH DAILY AT 6PM 90 tablet 3   carvedilol (COREG) 6.25 MG tablet TAKE ONE TABLET (6.25MG  TOTAL) BY MOUTH TWO TIMES DAILY WITH A MEAL 180 tablet 3   clopidogrel (PLAVIX) 75 MG tablet TAKE ONE (1) TABLET BY MOUTH EVERY DAY 90 tablet 3   cyclobenzaprine (FLEXERIL) 10 MG tablet Take 10 mg by mouth daily as needed for muscle spasms.     empagliflozin (JARDIANCE) 10 MG TABS tablet TAKE ONE TABLET (10MG  TOTAL) BY MOUTH DAILY BEFORE BREAKFAST 90 tablet 0   ENTRESTO 24-26 MG TAKE ONE TABLET BY MOUTH TWICE A DAY 60 tablet 11   omeprazole (PRILOSEC) 20 MG capsule Take 20 mg by mouth every evening.     No current facility-administered medications for this visit.     Past Medical History:  Diagnosis Date   AICD (automatic cardioverter/defibrillator) present    Chronic neck pain    Coronary artery disease    a. 11/2017: s/p anterolateral STEMI with cath showing severe thrombotic occlusion of proximal-LAD --> PCI/DES performed   Essential hypertension    Headache(784.0)    Hypercholesteremia    ICD (implantable cardioverter-defibrillator) in place    Medtronic, September 2020 - Dr. [name]   Ischemic cardiomyopathy    Migraine    Muscle spasms of neck    Myocardial infarction (HCC) 2019     ROS:   All systems reviewed and negative except as noted in the HPI.   Past Surgical History:  Procedure Laterality Date   CHOLECYSTECTOMY     1/12   COLONOSCOPY  03/15/2011   Procedure: COLONOSCOPY;  Surgeon: [name], MD;  [facility];  Service: Endoscopy;  Laterality: N/A;  9:30 am   COLONOSCOPY WITH PROPOFOL N/A 05/11/2021   Procedure: COLONOSCOPY WITH PROPOFOL;  Surgeon: [name], MD;  [facility];  Service: Endoscopy;  Laterality: N/A;  820   CORONARY STENT INTERVENTION N/A 12/03/2017   Procedure: CORONARY STENT INTERVENTION;  Surgeon: [name], MD;  Location: [facility] INVASIVE CV LAB;  Service: Cardiovascular;  Laterality: N/A;   ESOPHAGOGASTRODUODENOSCOPY N/A 06/26/2012   Procedure: ESOPHAGOGASTRODUODENOSCOPY (EGD);  Surgeon: [name], MD;  [facility];  Service: Endoscopy;  Laterality: N/A;   fractured jaw     fractured nose     ICD IMPLANT N/A 11/27/2018   Procedure: ICD IMPLANT;  Surgeon: [name], MD;  Location: [facility] INVASIVE CV LAB;  Service: Cardiovascular;  Laterality: N/A;   left acl repair     LEFT HEART CATH AND CORONARY ANGIOGRAPHY N/A 12/03/2017   Procedure: LEFT HEART CATH AND CORONARY ANGIOGRAPHY;  Surgeon: [name], MD;  Location: [facility] INVASIVE CV  LAB;  Service: Cardiovascular;  Laterality: N/A;   Left Knee Arthroscopy     TIBIA FRACTURE SURGERY       Family History  Problem Relation Age of Onset   Cancer Mother    Colon cancer Neg Hx    Heart disease Neg Hx      Social History   Socioeconomic History   Marital status: Married    Spouse name: Joy   Number of children: Not on file   Years of education: Not on file   Highest education level: Not on file  Occupational History   Occupation: Former TEFL teacher   Tobacco Use   Smoking status: Never   Smokeless tobacco: Never  Vaping Use   Vaping status: Never Used  Substance and Sexual Activity   Alcohol use: No   Drug  use: No   Sexual activity: Not on file  Other Topics Concern   Not on file  Social History Narrative   Not on file   Social Drivers of Health   Financial Resource Strain: Not on file  Food Insecurity: No Food Insecurity (03/30/2020)   Received from [facility], [facility]   Hunger Vital Sign    Worried About Running Out of Food in the Last Year: Never true    Ran Out of Food in the Last Year: Never true  Transportation Needs: Not on file  Physical Activity: Not on file  Stress: Not on file  Social Connections: Unknown (07/09/2021)   Received from [facility], [facility]   Social Network    Social Network: Not on file  Intimate Partner Violence: Unknown (06/01/2021)   Received from [employer], [facility]   HITS    Physically Hurt: Not on file    Insult or Talk Down To: Not on file    Threaten Physical Harm: Not on file    Scream or Curse: Not on file     BP 124/74 (BP Location: Right Arm, Patient Position: Sitting, Cuff Size: Normal)   Pulse 74   Ht 6' (1.829 m)   Wt 200 lb (90.7 kg)   SpO2 99%   BMI 27.12 kg/m   Physical Exam:  Well appearing NAD HEENT: Unremarkable Neck:  No JVD, no thyromegally Lymphatics:  No adenopathy Back:  No CVA tenderness Lungs:  Clear with no wheezes HEART:  Regular rate rhythm, no murmurs, no rubs, no clicks Abd:  soft, positive bowel sounds, no organomegally, no rebound, no guarding Ext:  2 plus pulses, no edema, no cyanosis, no clubbing Skin:  No rashes no nodules Neuro:  CN II through XII intact, motor grossly intact  DEVICE  Normal device function.  See PaceArt for details.   1. Chronic systolic heart failure -His symptoms remain class 2A. He will continue his current meds. 2. CAD - he denies anginal symptoms. He is encouraged to increase his activity. 3. ICD - his medtronic dual chamber ICD is working normally. We will recheck in several months.  4. Dyslipidemia - he will continue high dose statin therapy with  lipitor.   Dorathy Daft Assess/Plan:

## 2023-03-19 NOTE — Patient Instructions (Signed)

## 2023-03-26 ENCOUNTER — Other Ambulatory Visit: Payer: Self-pay | Admitting: Cardiology

## 2023-03-28 ENCOUNTER — Ambulatory Visit: Payer: Medicare PPO | Admitting: Cardiology

## 2023-04-03 ENCOUNTER — Ambulatory Visit (INDEPENDENT_AMBULATORY_CARE_PROVIDER_SITE_OTHER): Payer: Medicare PPO

## 2023-04-03 DIAGNOSIS — I255 Ischemic cardiomyopathy: Secondary | ICD-10-CM | POA: Diagnosis not present

## 2023-04-03 DIAGNOSIS — I5022 Chronic systolic (congestive) heart failure: Secondary | ICD-10-CM

## 2023-04-04 LAB — CUP PACEART REMOTE DEVICE CHECK
Battery Remaining Longevity: 74 mo
Battery Voltage: 2.99 V
Brady Statistic AP VP Percent: 0.05 %
Brady Statistic AP VS Percent: 62.99 %
Brady Statistic AS VP Percent: 0.01 %
Brady Statistic AS VS Percent: 36.95 %
Brady Statistic RA Percent Paced: 62.08 %
Brady Statistic RV Percent Paced: 0.06 %
Date Time Interrogation Session: 20250204073624
HighPow Impedance: 72 Ohm
Implantable Lead Connection Status: 753985
Implantable Lead Connection Status: 753985
Implantable Lead Implant Date: 20200930
Implantable Lead Implant Date: 20200930
Implantable Lead Location: 753859
Implantable Lead Location: 753860
Implantable Lead Model: 5076
Implantable Lead Model: 6935
Implantable Pulse Generator Implant Date: 20200930
Lead Channel Impedance Value: 285 Ohm
Lead Channel Impedance Value: 361 Ohm
Lead Channel Impedance Value: 475 Ohm
Lead Channel Pacing Threshold Amplitude: 0.625 V
Lead Channel Pacing Threshold Amplitude: 0.875 V
Lead Channel Pacing Threshold Pulse Width: 0.4 ms
Lead Channel Pacing Threshold Pulse Width: 0.4 ms
Lead Channel Sensing Intrinsic Amplitude: 15 mV
Lead Channel Sensing Intrinsic Amplitude: 15 mV
Lead Channel Sensing Intrinsic Amplitude: 2.25 mV
Lead Channel Sensing Intrinsic Amplitude: 2.25 mV
Lead Channel Setting Pacing Amplitude: 2 V
Lead Channel Setting Pacing Amplitude: 2.5 V
Lead Channel Setting Pacing Pulse Width: 0.4 ms
Lead Channel Setting Sensing Sensitivity: 0.3 mV
Zone Setting Status: 755011
Zone Setting Status: 755011

## 2023-04-16 ENCOUNTER — Other Ambulatory Visit: Payer: Self-pay | Admitting: Cardiology

## 2023-05-10 NOTE — Addendum Note (Signed)
 Addended by: Geralyn Flash D on: 05/10/2023 01:24 PM   Modules accepted: Orders

## 2023-05-10 NOTE — Progress Notes (Signed)
 Remote ICD transmission.

## 2023-06-18 DIAGNOSIS — Z85828 Personal history of other malignant neoplasm of skin: Secondary | ICD-10-CM | POA: Diagnosis not present

## 2023-06-18 DIAGNOSIS — L570 Actinic keratosis: Secondary | ICD-10-CM | POA: Diagnosis not present

## 2023-06-18 DIAGNOSIS — L578 Other skin changes due to chronic exposure to nonionizing radiation: Secondary | ICD-10-CM | POA: Diagnosis not present

## 2023-07-03 ENCOUNTER — Ambulatory Visit (INDEPENDENT_AMBULATORY_CARE_PROVIDER_SITE_OTHER): Payer: Medicare PPO

## 2023-07-03 DIAGNOSIS — I255 Ischemic cardiomyopathy: Secondary | ICD-10-CM

## 2023-07-03 LAB — CUP PACEART REMOTE DEVICE CHECK
Battery Remaining Longevity: 69 mo
Battery Voltage: 2.99 V
Brady Statistic AP VP Percent: 0.1 %
Brady Statistic AP VS Percent: 69.04 %
Brady Statistic AS VP Percent: 0.01 %
Brady Statistic AS VS Percent: 30.85 %
Brady Statistic RA Percent Paced: 68.19 %
Brady Statistic RV Percent Paced: 0.12 %
Date Time Interrogation Session: 20250506063523
HighPow Impedance: 72 Ohm
Implantable Lead Connection Status: 753985
Implantable Lead Connection Status: 753985
Implantable Lead Implant Date: 20200930
Implantable Lead Implant Date: 20200930
Implantable Lead Location: 753859
Implantable Lead Location: 753860
Implantable Lead Model: 5076
Implantable Lead Model: 6935
Implantable Pulse Generator Implant Date: 20200930
Lead Channel Impedance Value: 285 Ohm
Lead Channel Impedance Value: 342 Ohm
Lead Channel Impedance Value: 456 Ohm
Lead Channel Pacing Threshold Amplitude: 0.625 V
Lead Channel Pacing Threshold Amplitude: 0.875 V
Lead Channel Pacing Threshold Pulse Width: 0.4 ms
Lead Channel Pacing Threshold Pulse Width: 0.4 ms
Lead Channel Sensing Intrinsic Amplitude: 13 mV
Lead Channel Sensing Intrinsic Amplitude: 13 mV
Lead Channel Sensing Intrinsic Amplitude: 2.125 mV
Lead Channel Sensing Intrinsic Amplitude: 2.125 mV
Lead Channel Setting Pacing Amplitude: 2 V
Lead Channel Setting Pacing Amplitude: 2.5 V
Lead Channel Setting Pacing Pulse Width: 0.4 ms
Lead Channel Setting Sensing Sensitivity: 0.3 mV
Zone Setting Status: 755011
Zone Setting Status: 755011

## 2023-08-03 ENCOUNTER — Other Ambulatory Visit (HOSPITAL_COMMUNITY): Payer: Self-pay | Admitting: Internal Medicine

## 2023-08-03 ENCOUNTER — Ambulatory Visit (HOSPITAL_COMMUNITY)
Admission: RE | Admit: 2023-08-03 | Discharge: 2023-08-03 | Disposition: A | Discharge status: Home / Self Care | Source: Ambulatory Visit | Attending: Internal Medicine | Admitting: Internal Medicine

## 2023-08-03 DIAGNOSIS — I5030 Unspecified diastolic (congestive) heart failure: Secondary | ICD-10-CM | POA: Diagnosis not present

## 2023-08-03 DIAGNOSIS — M47812 Spondylosis without myelopathy or radiculopathy, cervical region: Secondary | ICD-10-CM | POA: Diagnosis not present

## 2023-08-03 DIAGNOSIS — R1310 Dysphagia, unspecified: Secondary | ICD-10-CM | POA: Diagnosis not present

## 2023-08-03 DIAGNOSIS — I10 Essential (primary) hypertension: Secondary | ICD-10-CM | POA: Diagnosis not present

## 2023-08-03 DIAGNOSIS — M79602 Pain in left arm: Secondary | ICD-10-CM | POA: Diagnosis not present

## 2023-08-03 DIAGNOSIS — M5030 Other cervical disc degeneration, unspecified cervical region: Secondary | ICD-10-CM | POA: Diagnosis not present

## 2023-08-03 DIAGNOSIS — M4802 Spinal stenosis, cervical region: Secondary | ICD-10-CM | POA: Diagnosis not present

## 2023-08-15 NOTE — Progress Notes (Signed)
 Remote ICD transmission.

## 2023-08-21 ENCOUNTER — Other Ambulatory Visit (HOSPITAL_COMMUNITY): Payer: Self-pay | Admitting: Occupational Therapy

## 2023-08-21 DIAGNOSIS — R1312 Dysphagia, oropharyngeal phase: Secondary | ICD-10-CM

## 2023-08-21 DIAGNOSIS — R059 Cough, unspecified: Secondary | ICD-10-CM

## 2023-09-03 ENCOUNTER — Encounter (HOSPITAL_COMMUNITY): Payer: Self-pay | Admitting: Speech Pathology

## 2023-09-03 ENCOUNTER — Ambulatory Visit (HOSPITAL_COMMUNITY): Attending: Internal Medicine | Admitting: Speech Pathology

## 2023-09-03 ENCOUNTER — Ambulatory Visit (HOSPITAL_COMMUNITY)
Admission: RE | Admit: 2023-09-03 | Discharge: 2023-09-03 | Disposition: A | Discharge status: Home / Self Care | Source: Ambulatory Visit | Attending: Internal Medicine | Admitting: Internal Medicine

## 2023-09-03 ENCOUNTER — Other Ambulatory Visit: Payer: Self-pay

## 2023-09-03 DIAGNOSIS — R059 Cough, unspecified: Secondary | ICD-10-CM | POA: Insufficient documentation

## 2023-09-03 DIAGNOSIS — R1312 Dysphagia, oropharyngeal phase: Secondary | ICD-10-CM | POA: Insufficient documentation

## 2023-09-03 DIAGNOSIS — R638 Other symptoms and signs concerning food and fluid intake: Secondary | ICD-10-CM | POA: Diagnosis not present

## 2023-09-03 NOTE — Therapy (Signed)
 Modified Barium Swallow Study  Patient Details  Name: Thomas Black. MRN: [id number] Date of Birth: 05/26/1940  Today's Date: 09/03/2023  HPI/PMH: HPI: Thomas Black is an 83 yo male who was referred for MBSS by Dr. Gaither Langton due to Pt reports of globus with solid foods when eating. He also reports left arm sensation changes (feels weak) and had imaging of c-spine in June. Pt tells SLP that he has to swallow several times or follow with liquid wash to clear globus sensation.  R13.10 (ICD-10-CM) - Dysphagia R05.9 (ICD-10-CM) - Cough, unspecified   End of Session - 09/03/23 1556     Visit Number 1    Number of Visits 1    Authorization Type [employer]    SLP Start Time 1335    SLP Stop Time  1406    SLP Time Calculation (min) 31 min    Activity Tolerance Patient tolerated treatment well         Imaging: DG Cervical Spine: Multilevel degenerative disc disease with narrowing C3-C4, C6-C7 with diffuse anterior osteophytic changes and posterior bony spondylosis. Mild bilateral foraminal narrowing on the right C3 and on the left C4-C5 C5-C6   Clinical Impression: Clinical Impression: Pt presents with mild oropharyngeal dysphagia characterized by essentially normal oral phase, mild decreased tongue base retraction and epiglottic deflection resulting in incomplete vallecular clearance with all textures and consistencies (most pronounced with thickened liquids and solid textures). Pt appears to have osteophytes C3-4 5-6 which may impact complete epiglottic deflection. Pharyngeal stripping wave and hyolaryngeal excursion WNL. Pt cued to implement effortful or hard swallow with solids which improved vallecular clearance. Head turn to the left with chin down was also effective in clearing vallecular residue. Both of these strategies increased epiglottic deflection- difficult to ascertain whether this helped push past the suspected osteophyte pharyngeal impedence or if this just increased  pharyngeal pressure/tongue base retraction. Pt had a single episode of flash laryngeal penetration during the swallow with thins which elicited a throat clear and was immediately removed. SLP reviewed the imaging and recommendations with Pt and advised to complete effortful swallows and also head turn to the LEFT periodically. SLP also recommended short term SLP dysphagia therapy for exercises (tongue base) and implementation of strategies, however Pt would like to hold off on therapy for now and implement the strategies provided, as he reports that he is not very bothered by globus. Pt may wish to follow up with his PCP regarding suspected left sided weakness and left arm fatigue. SLP will sign off at this time. He was provided with written strategies and my contact information should he have further questions or concerns. Recommend regular textures and thin liquids, add moisture to dry solids, complete effortful swallow and occasional head turn to the LEFT, PO medication whole with water , and Pt to swallow 2x for each bite.   Factors that may increase risk of adverse event in presence of aspiration Noe & Lianne 2021): No data recorded  Recommendations/Plan: Swallowing Evaluation Recommendations Swallowing Evaluation Recommendations Recommendations: PO diet PO Diet Recommendation: Regular; Thin liquids (Level 0) Liquid Administration via: Cup; Straw Medication Administration: Whole meds with liquid Supervision: Patient able to self-feed Swallowing strategies  : effortful swallow; Multiple dry swallows after each bite/sip; Head turn left during swallowing Postural changes: Position pt fully upright for meals; Stay upright 30-60 min after meals Oral care recommendations: Oral care BID (2x/day); Pt independent with oral care    Treatment Plan Treatment Plan Treatment recommendations: Other (comment) (  Recommended short term dysphagia therapy, however Pt does not wish to pursue at this time.  He will contact SLP if he changes his mind) Follow-up recommendations: Outpatient SLP   Recommendations Recommendations for follow up therapy are one component of a multi-disciplinary discharge planning process, led by the attending physician.  Recommendations may be updated based on patient status, additional functional criteria and insurance authorization.  Assessment: Orofacial Exam: Orofacial Exam Oral Cavity: Oral Hygiene: WFL; Xerostomia Oral Cavity - Dentition: -- (U/L partials) Orofacial Anatomy: WFL Oral Motor/Sensory Function: WFL   Anatomy:  Anatomy: Suspected cervical osteophytes  Boluses Administered: Boluses Administered Boluses Administered: Thin liquids (Level 0); Mildly thick liquids (Level 2, nectar thick); Moderately thick liquids (Level 3, honey thick); Puree; Solid    Oral Impairment Domain: Oral Impairment Domain Lip Closure: No labial escape Tongue control during bolus hold: Cohesive bolus between tongue to palatal seal Bolus preparation/mastication: Timely and efficient chewing and mashing Bolus transport/lingual motion: Brisk tongue motion Oral residue: Complete oral clearance Location of oral residue : N/A Initiation of pharyngeal swallow : Posterior angle of the ramus    Pharyngeal Impairment Domain: Pharyngeal Impairment Domain Soft palate elevation: No bolus between soft palate (SP)/pharyngeal wall (PW) Laryngeal elevation: Complete superior movement of thyroid cartilage with complete approximation of arytenoids to epiglottic petiole Anterior hyoid excursion: Complete anterior movement Epiglottic movement: Partial inversion Laryngeal vestibule closure: Incomplete, narrow column air/contrast in laryngeal vestibule Pharyngeal stripping wave : Present - complete Pharyngeal contraction (A/P view only): Complete Pharyngoesophageal segment opening: Complete distension and complete duration, no obstruction of flow Tongue base retraction: Wide column  of contrast or air between tongue base and PPW Pharyngeal residue: Collection of residue within or on pharyngeal structures Location of pharyngeal residue: Valleculae    Esophageal Impairment Domain: Esophageal Impairment Domain Esophageal clearance upright position: Complete clearance, esophageal coating   Pill: Pill Consistency administered: Thin liquids (Level 0) Thin liquids (Level 0): [facility]   Penetration/Aspiration Scale Score: Penetration/Aspiration Scale Score 1.  Material does not enter airway: Mildly thick liquids (Level 2, nectar thick); Moderately thick liquids (Level 3, honey thick); Puree; Solid; Pill 2.  Material enters airway, remains ABOVE vocal cords then ejected out: Thin liquids (Level 0)   Compensatory Strategies: Compensatory Strategies Compensatory strategies: Yes Effortful swallow: Effective Effective Effortful Swallow: Puree; Solid Multiple swallows: Effective Effective Multiple Swallows: Thin liquid (Level 0); Mildly thick liquid (Level 2, nectar thick); Moderately thick liquid (Level 3, honey thick); Puree; Solid Liquid wash: Effective (mildly effective) Effective Liquid Wash: Solid Left head turn: Effective Effective Left Head Turn: Moderately thick liquid (Level 3, honey thick); Puree; Solid Right head turn: Ineffective     General Information: Caregiver present: No   Diet Prior to this Study: Regular; Thin liquids (Level 0)    Temperature : Normal    Respiratory Status: WFL    Supplemental O2: None (Room air)    History of Recent Intubation: No   Behavior/Cognition: Alert; Cooperative; Pleasant mood  Self-Feeding Abilities: Able to self-feed  Baseline vocal quality/speech: Normal  Volitional Cough: Able to elicit  Volitional Swallow: Able to elicit  Exam Limitations: No limitations   Pain: Pain Assessment Pain Assessment: No/denies pain  SLP visit diagnosis: SLP Visit Diagnosis: Dysphagia, oropharyngeal phase  (R13.12)  Past Medical History:  Past Medical History:  Diagnosis Date   AICD (automatic cardioverter/defibrillator) present    Chronic neck pain    Coronary artery disease    a. 11/2017: s/p anterolateral STEMI with cath showing severe thrombotic occlusion  of proximal-LAD --> PCI/DES performed   Essential hypertension    Headache(784.0)    Hypercholesteremia    ICD (implantable cardioverter-defibrillator) in place    Medtronic, September 2020 - Dr. Waddell   Ischemic cardiomyopathy    Migraine    Muscle spasms of neck    Myocardial infarction [facility]) 2019   Past Surgical History:  Past Surgical History:  Procedure Laterality Date   CHOLECYSTECTOMY     1/12   COLONOSCOPY  03/15/2011   Procedure: COLONOSCOPY;  Surgeon: Claudis RAYMOND Rivet, MD;  [facility];  Service: Endoscopy;  Laterality: N/A;  9:30 am   COLONOSCOPY WITH PROPOFOL  N/A 05/11/2021   Procedure: COLONOSCOPY WITH PROPOFOL ;  Surgeon: Rivet Claudis RAYMOND, MD;  Location: [facility];  Service: Endoscopy;  Laterality: N/A;  820   CORONARY STENT INTERVENTION N/A 12/03/2017   Procedure: CORONARY STENT INTERVENTION;  Surgeon: Wonda Sharper, MD;  Location: [facility] INVASIVE CV LAB;  Service: Cardiovascular;  Laterality: N/A;   ESOPHAGOGASTRODUODENOSCOPY N/A 06/26/2012   Procedure: ESOPHAGOGASTRODUODENOSCOPY (EGD);  Surgeon: Claudis RAYMOND Rivet, MD;  Location: [facility];  Service: Endoscopy;  Laterality: N/A;   fractured jaw     fractured nose     ICD IMPLANT N/A 11/27/2018   Procedure: ICD IMPLANT;  Surgeon: Waddell Danelle ORN, MD;  Location: [facility] INVASIVE CV LAB;  Service: Cardiovascular;  Laterality: N/A;   left acl repair     LEFT HEART CATH AND CORONARY ANGIOGRAPHY N/A 12/03/2017   Procedure: LEFT HEART CATH AND CORONARY ANGIOGRAPHY;  Surgeon: Wonda Sharper, MD;  Location: [facility] INVASIVE CV LAB;  Service: Cardiovascular;  Laterality: N/A;   Left Knee Arthroscopy     TIBIA FRACTURE SURGERY     Thank you,  Lamar Candy,  CCC-SLP [phone]  Inessa Wardrop 09/03/2023, 4:08 PM

## 2023-09-24 ENCOUNTER — Other Ambulatory Visit: Payer: Self-pay | Admitting: Cardiology

## 2023-09-25 ENCOUNTER — Ambulatory Visit: Attending: Cardiology | Admitting: Cardiology

## 2023-09-25 ENCOUNTER — Encounter: Payer: Self-pay | Admitting: Cardiology

## 2023-09-25 VITALS — BP 100/60 | HR 60 | Ht 72.0 in | Wt 191.4 lb

## 2023-09-25 DIAGNOSIS — I25119 Atherosclerotic heart disease of native coronary artery with unspecified angina pectoris: Secondary | ICD-10-CM

## 2023-09-25 DIAGNOSIS — I5020 Unspecified systolic (congestive) heart failure: Secondary | ICD-10-CM

## 2023-09-25 DIAGNOSIS — Z95810 Presence of automatic (implantable) cardiac defibrillator: Secondary | ICD-10-CM

## 2023-09-25 DIAGNOSIS — I2510 Atherosclerotic heart disease of native coronary artery without angina pectoris: Secondary | ICD-10-CM

## 2023-09-25 DIAGNOSIS — E782 Mixed hyperlipidemia: Secondary | ICD-10-CM | POA: Diagnosis not present

## 2023-09-25 MED ORDER — CLOPIDOGREL BISULFATE 75 MG PO TABS: 75.0000 mg | ORAL_TABLET | Freq: Every day | ORAL | 3 refills | Status: AC

## 2023-09-25 NOTE — Progress Notes (Signed)
 Cardiology Office Note  Date: 09/25/2023   ID: Thomas Black., DOB 1940/11/25, MRN [id number]  History of Present Illness: Braxdon Gappa. is an 83 y.o. male last seen by Ms. Franchester RIGGERS in January, I reviewed her note.  He is here for a follow-up visit.  Reports no change in stamina, NYHA class II dyspnea, no fluid retention, no orthopnea or PND.  Remains active with ADLs and outside chores.  Medtronic ICD in place with follow-up by Dr. Waddell.  Device interrogation in May indicated normal function.  He does not report any device shocks or syncope.  We went over his medications.  States that he has been compliant with current regimen.  Tends to tolerate low normal blood pressure although not on standing diuretics.  I reviewed his interval lab work which is noted below.  Physical Exam: VS:  BP 100/60 (BP Location: Right Arm, Cuff Size: Normal)   Pulse 60   Ht 6' (1.829 m)   Wt 191 lb 6.4 oz (86.8 kg)   SpO2 97%   BMI 25.96 kg/m , BMI Body mass index is 25.96 kg/m.  Wt Readings from Last 3 Encounters:  09/25/23 191 lb 6.4 oz (86.8 kg)  03/19/23 200 lb (90.7 kg)  03/12/23 204 lb (92.5 kg)    General: Patient appears comfortable at rest. HEENT: Conjunctiva and lids normal. Neck: Supple, no elevated JVP or carotid bruits. Lungs: Clear to auscultation, nonlabored breathing at rest. Cardiac: Regular rate and rhythm, no S3 or significant systolic murmur. Extremities: No pitting edema.  ECG:  An ECG dated 03/12/2023 was personally reviewed today and demonstrated:  Atrial paced rhythm with poor R wave progression rule out old anterior infarct pattern, left anterior fascicular block.  Labwork:  December 2024: Hemoglobin 14.9, platelets 270, BUN 14, creatinine 0.9, potassium 4.6, GFR 87, AST 19, ALT 16, cholesterol 124, glycerides 236, HDL 45, LDL 42  Other Studies Reviewed Today:  Echocardiogram 10/20/2022:  1. Left ventricular ejection fraction, by estimation, is 30 to 35%.  The  left ventricle has moderately decreased function. The left ventricle  demonstrates regional wall motion abnormalities (see scoring  diagram/findings for description). There is mild  asymmetric left ventricular hypertrophy of the basal segment. Left  ventricular diastolic parameters are consistent with Grade I diastolic  dysfunction (impaired relaxation).   2. Sluggish flow at LV apex without formed mural thrombus by Definity   contrast.   3. Right ventricular systolic function is normal. The right ventricular  size is normal. Tricuspid regurgitation signal is inadequate for assessing  PA pressure.   4. The mitral valve is grossly normal. Trivial mitral valve  regurgitation.   5. The aortic valve is tricuspid. Aortic valve regurgitation is not  visualized. No aortic stenosis is present.   6. The inferior vena cava is normal in size with <50% respiratory  variability, suggesting right atrial pressure of 8 mmHg.   Assessment and Plan:  1.  HFrEF with ischemic cardiomyopathy, LVEF 30 to 35% by echocardiogram in August 2024.  Clinically stable with NYHA class II dyspnea and no fluid retention.  Continue Coreg  6.25 mg twice daily, Jardiance  10 mg daily, and Entresto  24/26 mg twice daily.  Did not tolerate Aldactone  previously due to orthostasis.   2.  CAD status post anterolateral STEMI due to thrombotic occlusion of the proximal LAD treated with DES in 2019.  He does not report any active angina.  Continue aspirin  81 mg daily and Plavix  75 mg daily.  3.  Primary hypertension.  Blood pressure low normal today.  No changes made to current regimen.  He is asymptomatic.   4.  Mixed hyperlipidemia.  LDL 42 in December 2024.  Continue Lipitor  80 mg daily.   5.  Medtronic ICD in place with follow-up by Dr. Waddell.  No device shocks or syncope.  Disposition:  Follow up 6 months.  Signed, Jayson JUDITHANN Sierras, M.D., F.A.C.C. [facility] at [facility]

## 2023-09-25 NOTE — Patient Instructions (Signed)
 Medication Instructions:  Your physician recommends that you continue on your current medications as directed. Please refer to the Current Medication list given to you today.   Labwork: None today  Testing/Procedures: None today  Follow-Up: 6 months  Any Other Special Instructions Will Be Listed Below (If Applicable).  If you need a refill on your cardiac medications before your next appointment, please call your pharmacy.

## 2023-10-02 ENCOUNTER — Ambulatory Visit (INDEPENDENT_AMBULATORY_CARE_PROVIDER_SITE_OTHER): Payer: Medicare PPO

## 2023-10-02 DIAGNOSIS — I255 Ischemic cardiomyopathy: Secondary | ICD-10-CM | POA: Diagnosis not present

## 2023-10-03 LAB — CUP PACEART REMOTE DEVICE CHECK
Battery Remaining Longevity: 62 mo
Battery Voltage: 2.98 V
Brady Statistic AP VP Percent: 0.1 %
Brady Statistic AP VS Percent: 77.14 %
Brady Statistic AS VP Percent: 0 %
Brady Statistic AS VS Percent: 22.75 %
Brady Statistic RA Percent Paced: 76.63 %
Brady Statistic RV Percent Paced: 0.13 %
Date Time Interrogation Session: 20250805042303
HighPow Impedance: 68 Ohm
Implantable Lead Connection Status: 753985
Implantable Lead Connection Status: 753985
Implantable Lead Implant Date: 20200930
Implantable Lead Implant Date: 20200930
Implantable Lead Location: 753859
Implantable Lead Location: 753860
Implantable Lead Model: 5076
Implantable Lead Model: 6935
Implantable Pulse Generator Implant Date: 20200930
Lead Channel Impedance Value: 285 Ohm
Lead Channel Impedance Value: 342 Ohm
Lead Channel Impedance Value: 475 Ohm
Lead Channel Pacing Threshold Amplitude: 0.625 V
Lead Channel Pacing Threshold Amplitude: 0.875 V
Lead Channel Pacing Threshold Pulse Width: 0.4 ms
Lead Channel Pacing Threshold Pulse Width: 0.4 ms
Lead Channel Sensing Intrinsic Amplitude: 1.75 mV
Lead Channel Sensing Intrinsic Amplitude: 1.75 mV
Lead Channel Sensing Intrinsic Amplitude: 13 mV
Lead Channel Sensing Intrinsic Amplitude: 13 mV
Lead Channel Setting Pacing Amplitude: 2 V
Lead Channel Setting Pacing Amplitude: 2.5 V
Lead Channel Setting Pacing Pulse Width: 0.4 ms
Lead Channel Setting Sensing Sensitivity: 0.3 mV
Zone Setting Status: 755011
Zone Setting Status: 755011

## 2023-10-04 ENCOUNTER — Ambulatory Visit: Payer: Self-pay | Admitting: Internal Medicine

## 2023-10-15 DIAGNOSIS — L821 Other seborrheic keratosis: Secondary | ICD-10-CM | POA: Diagnosis not present

## 2023-10-15 DIAGNOSIS — L814 Other melanin hyperpigmentation: Secondary | ICD-10-CM | POA: Diagnosis not present

## 2023-10-15 DIAGNOSIS — Z85828 Personal history of other malignant neoplasm of skin: Secondary | ICD-10-CM | POA: Diagnosis not present

## 2023-10-15 DIAGNOSIS — L570 Actinic keratosis: Secondary | ICD-10-CM | POA: Diagnosis not present

## 2023-10-15 DIAGNOSIS — D1801 Hemangioma of skin and subcutaneous tissue: Secondary | ICD-10-CM | POA: Diagnosis not present

## 2023-11-26 NOTE — Progress Notes (Signed)
 Remote ICD Transmission

## 2024-01-01 ENCOUNTER — Other Ambulatory Visit: Payer: Self-pay | Admitting: Cardiology

## 2024-01-01 ENCOUNTER — Ambulatory Visit: Payer: Medicare PPO

## 2024-01-01 DIAGNOSIS — I255 Ischemic cardiomyopathy: Secondary | ICD-10-CM

## 2024-01-01 LAB — CUP PACEART REMOTE DEVICE CHECK
Battery Remaining Longevity: 61 mo
Battery Voltage: 2.98 V
Brady Statistic AP VP Percent: 0.14 %
Brady Statistic AP VS Percent: 81.28 %
Brady Statistic AS VP Percent: 0 %
Brady Statistic AS VS Percent: 18.58 %
Brady Statistic RA Percent Paced: 80.53 %
Brady Statistic RV Percent Paced: 0.17 %
Date Time Interrogation Session: 20251104074230
HighPow Impedance: 69 Ohm
Implantable Lead Connection Status: 753985
Implantable Lead Connection Status: 753985
Implantable Lead Implant Date: 20200930
Implantable Lead Implant Date: 20200930
Implantable Lead Location: 753859
Implantable Lead Location: 753860
Implantable Lead Model: 5076
Implantable Lead Model: 6935
Implantable Pulse Generator Implant Date: 20200930
Lead Channel Impedance Value: 304 Ohm
Lead Channel Impedance Value: 342 Ohm
Lead Channel Impedance Value: 475 Ohm
Lead Channel Pacing Threshold Amplitude: 0.625 V
Lead Channel Pacing Threshold Amplitude: 0.875 V
Lead Channel Pacing Threshold Pulse Width: 0.4 ms
Lead Channel Pacing Threshold Pulse Width: 0.4 ms
Lead Channel Sensing Intrinsic Amplitude: 1.75 mV
Lead Channel Sensing Intrinsic Amplitude: 1.75 mV
Lead Channel Sensing Intrinsic Amplitude: 12.875 mV
Lead Channel Sensing Intrinsic Amplitude: 12.875 mV
Lead Channel Setting Pacing Amplitude: 2 V
Lead Channel Setting Pacing Amplitude: 2.5 V
Lead Channel Setting Pacing Pulse Width: 0.4 ms
Lead Channel Setting Sensing Sensitivity: 0.3 mV
Zone Setting Status: 755011
Zone Setting Status: 755011

## 2024-01-04 ENCOUNTER — Ambulatory Visit: Payer: Self-pay | Admitting: Internal Medicine

## 2024-01-04 NOTE — Progress Notes (Signed)
 Remote ICD Transmission

## 2024-01-07 ENCOUNTER — Other Ambulatory Visit: Payer: Self-pay | Admitting: Cardiology

## 2024-01-10 ENCOUNTER — Other Ambulatory Visit: Payer: Self-pay | Admitting: Cardiology

## 2024-04-01 ENCOUNTER — Ambulatory Visit: Payer: Medicare PPO

## 2024-04-02 ENCOUNTER — Encounter: Payer: Self-pay | Admitting: Cardiology

## 2024-04-02 ENCOUNTER — Ambulatory Visit: Admitting: Cardiology

## 2024-04-02 VITALS — BP 116/62 | HR 62 | Ht 72.0 in | Wt 194.6 lb

## 2024-04-02 DIAGNOSIS — I5020 Unspecified systolic (congestive) heart failure: Secondary | ICD-10-CM | POA: Diagnosis not present

## 2024-04-02 DIAGNOSIS — E782 Mixed hyperlipidemia: Secondary | ICD-10-CM | POA: Diagnosis not present

## 2024-04-02 DIAGNOSIS — Z95810 Presence of automatic (implantable) cardiac defibrillator: Secondary | ICD-10-CM | POA: Diagnosis not present

## 2024-04-02 DIAGNOSIS — I255 Ischemic cardiomyopathy: Secondary | ICD-10-CM | POA: Diagnosis not present

## 2024-04-02 DIAGNOSIS — I25119 Atherosclerotic heart disease of native coronary artery with unspecified angina pectoris: Secondary | ICD-10-CM

## 2024-04-02 LAB — CUP PACEART REMOTE DEVICE CHECK
Battery Remaining Longevity: 57 mo
Battery Voltage: 2.98 V
Brady Statistic AP VP Percent: 0.11 %
Brady Statistic AP VS Percent: 81.49 %
Brady Statistic AS VP Percent: 0.01 %
Brady Statistic AS VS Percent: 18.4 %
Brady Statistic RA Percent Paced: 80.48 %
Brady Statistic RV Percent Paced: 0.14 %
Date Time Interrogation Session: 20260203061706
HighPow Impedance: 69 Ohm
Implantable Lead Connection Status: 753985
Implantable Lead Connection Status: 753985
Implantable Lead Implant Date: 20200930
Implantable Lead Implant Date: 20200930
Implantable Lead Location: 753859
Implantable Lead Location: 753860
Implantable Lead Model: 5076
Implantable Lead Model: 6935
Implantable Pulse Generator Implant Date: 20200930
Lead Channel Impedance Value: 285 Ohm
Lead Channel Impedance Value: 342 Ohm
Lead Channel Impedance Value: 475 Ohm
Lead Channel Pacing Threshold Amplitude: 0.625 V
Lead Channel Pacing Threshold Amplitude: 0.875 V
Lead Channel Pacing Threshold Pulse Width: 0.4 ms
Lead Channel Pacing Threshold Pulse Width: 0.4 ms
Lead Channel Sensing Intrinsic Amplitude: 1.75 mV
Lead Channel Sensing Intrinsic Amplitude: 1.75 mV
Lead Channel Sensing Intrinsic Amplitude: 12.625 mV
Lead Channel Sensing Intrinsic Amplitude: 12.625 mV
Lead Channel Setting Pacing Amplitude: 2 V
Lead Channel Setting Pacing Amplitude: 2.5 V
Lead Channel Setting Pacing Pulse Width: 0.4 ms
Lead Channel Setting Sensing Sensitivity: 0.3 mV
Zone Setting Status: 755011
Zone Setting Status: 755011

## 2024-04-02 MED ORDER — SACUBITRIL-VALSARTAN 24-26 MG PO TABS: 1.0000 | ORAL_TABLET | Freq: Two times a day (BID) | ORAL | 11 refills | Status: AC

## 2024-04-02 NOTE — Patient Instructions (Signed)
 Medication Instructions:   Your physician recommends that you continue on your current medications as directed. Please refer to the Current Medication list given to you today.   Labwork: None today  Testing/Procedures: Echo in August 2026  Follow-Up: 6 months with Dr.McDowell  Any Other Special Instructions Will Be Listed Below (If Applicable).  If you need a refill on your cardiac medications before your next appointment, please call your pharmacy.

## 2024-04-02 NOTE — Progress Notes (Signed)
"  ° ° °  Cardiology Office Note  Date: 04/02/2024   ID: Thomas Laminack., DOB [birth year], MRN [id number]  History of Present Illness: Thomas Oquendo. is an 84 y.o. male last seen in July 2025.  He is here for a routine visit.  Reports stable NYHA class II dyspnea, no fluid retention or unusual weight gain, no exertional angina.  We went over his medications which are stable from a cardiac perspective.  I reviewed his recent lab work which is noted below.  He had a recent ECG obtained during physical in mid January showing atrial pacing with old anterior infarct pattern and left anterior fascicular block, relatively stable.  Medtronic ICD in place, now with follow-up per Dr. Almetta.  Device check from November 2025 showed normal function.  We discussed getting an updated echocardiogram for his next visit.  Physical Exam: VS:  BP 116/62   Pulse 62   Ht 6' (1.829 m)   Wt 194 lb 9.6 oz (88.3 kg)   SpO2 97%   BMI 26.39 kg/m , BMI Body mass index is 26.39 kg/m.  Wt Readings from Last 3 Encounters:  04/02/24 194 lb 9.6 oz (88.3 kg)  09/25/23 191 lb 6.4 oz (86.8 kg)  03/19/23 200 lb (90.7 kg)    General: Patient appears comfortable at rest. HEENT: Conjunctiva and lids normal. Neck: Supple, no elevated JVP or carotid bruits. Lungs: Clear to auscultation, nonlabored breathing at rest. Cardiac: Regular rate and rhythm, no S3 or significant systolic murmur. Extremities: No pitting edema.  ECG:  An ECG dated 03/12/2023 was personally reviewed today and demonstrated:  Atrial paced rhythm with poor R wave progression, rule out old anterior infarct pattern, left anterior fascicular block.  Labwork:  January 2026: Hemoglobin 15.3, platelets 242, BUN 15, creatinine 1.04, potassium 4.3, GFR 72, AST 22, ALT 19, cholesterol 111, triglycerides 171, HDL 42, LDL 41  Other Studies Reviewed Today:  No interval cardiac testing for review today.  Assessment and Plan:  1.  HFrEF with ischemic  cardiomyopathy, LVEF 30 to 35% by echocardiogram in August 2024.  Continues to do well, no fluid retention and stable NYHA class II dyspnea.  Continue Coreg  6.25 mg twice daily, Jardiance  10 mg daily, and Entresto  24/26 mg twice daily.  Currently without diuretic requirement.  Did not tolerate Aldactone  previously due to orthostasis.  We will update echocardiogram in 6 months for his next visit.   2.  CAD status post anterolateral STEMI due to thrombotic occlusion of the proximal LAD treated with DES in 2019.  No active angina.  He continues on aspirin  81 mg daily and Lipitor  80 mg daily.   3.  Primary hypertension.  Blood pressure low normal, no changes made to current regimen.   4.  Mixed hyperlipidemia.  Recent LDL 41.  Continue Lipitor  80 mg daily.  5.  Medtronic ICD in place with follow-up by Dr. Almetta.  Continue with interval remote device check.  Denies any device shocks or syncope.  Disposition:  Follow up 6 months.  Signed, Jayson JUDITHANN Sierras, M.D., F.A.C.C. [facility] at [facility] "

## 2024-04-03 ENCOUNTER — Telehealth: Payer: Self-pay

## 2024-04-03 NOTE — Telephone Encounter (Signed)
 Patient came into the office with a letter from [facility] stating that Entresto  24-26 was not a covered drug under his current insurance benefits. [employer] is requesting a prior authorization for medication. Letter stated that they did send pt a limited supply of medication until prior authorization had been completed.   Will forward to PharmD to complete authorization

## 2024-04-04 ENCOUNTER — Other Ambulatory Visit (HOSPITAL_COMMUNITY): Payer: Self-pay

## 2024-04-04 ENCOUNTER — Telehealth: Payer: Self-pay | Admitting: Pharmacy Technician

## 2024-04-04 NOTE — Telephone Encounter (Signed)
 Pharmacy Patient Advocate Encounter   Received notification from Pt Calls Messages that prior authorization for Entresto  GENERIC is required/requested.   Insurance verification completed.   The patient is insured through [place].   Per test claim: Refill too soon. PA is not needed at this time. Medication was filled 03/13/24. Next eligible fill date is 04/05/24.  Ran a different strength to see if covered and it ran for generic for $10.00

## 2024-09-30 ENCOUNTER — Other Ambulatory Visit (HOSPITAL_COMMUNITY)
